# Patient Record
Sex: Female | Born: 1975
Health system: Southern US, Community
[De-identification: ages and names within clinical notes are randomized; demographics above are authoritative.]

## PROBLEM LIST (undated history)

## (undated) DIAGNOSIS — F329 Major depressive disorder, single episode, unspecified: Secondary | ICD-10-CM

## (undated) DIAGNOSIS — F419 Anxiety disorder, unspecified: Secondary | ICD-10-CM

## (undated) DIAGNOSIS — N200 Calculus of kidney: Secondary | ICD-10-CM

## (undated) DIAGNOSIS — F319 Bipolar disorder, unspecified: Secondary | ICD-10-CM

## (undated) DIAGNOSIS — F3181 Bipolar II disorder: Secondary | ICD-10-CM

## (undated) DIAGNOSIS — E119 Type 2 diabetes mellitus without complications: Secondary | ICD-10-CM

## (undated) DIAGNOSIS — G43909 Migraine, unspecified, not intractable, without status migrainosus: Secondary | ICD-10-CM

## (undated) DIAGNOSIS — F32A Depression, unspecified: Secondary | ICD-10-CM

## (undated) DIAGNOSIS — G4733 Obstructive sleep apnea (adult) (pediatric): Secondary | ICD-10-CM

## (undated) DIAGNOSIS — E079 Disorder of thyroid, unspecified: Secondary | ICD-10-CM

## (undated) HISTORY — DX: Obstructive sleep apnea (adult) (pediatric): G47.33

## (undated) HISTORY — DX: Migraine, unspecified, not intractable, without status migrainosus: G43.909

## (undated) HISTORY — DX: Type 2 diabetes mellitus without complications: E11.9

## (undated) HISTORY — DX: Calculus of kidney: N20.0

## (undated) HISTORY — DX: Bipolar II disorder: F31.81

## (undated) HISTORY — PX: OTHER SURGICAL HISTORY: SHX169

## (undated) HISTORY — DX: Major depressive disorder, single episode, unspecified: F32.9

## (undated) HISTORY — PX: WISDOM TOOTH EXTRACTION: SHX21

## (undated) HISTORY — PX: NASAL SEPTUM SURGERY: SHX37

## (undated) HISTORY — DX: Depression, unspecified: F32.A

## (undated) HISTORY — DX: Disorder of thyroid, unspecified: E07.9

## (undated) HISTORY — DX: Anxiety disorder, unspecified: F41.9

## (undated) HISTORY — DX: Bipolar disorder, unspecified: F31.9

---

## 2001-05-02 ENCOUNTER — Other Ambulatory Visit: Admission: RE | Admit: 2001-05-02 | Discharge: 2001-05-02 | Payer: Self-pay | Admitting: Obstetrics and Gynecology

## 2004-06-24 ENCOUNTER — Ambulatory Visit: Payer: Self-pay | Admitting: Family Medicine

## 2004-10-13 ENCOUNTER — Ambulatory Visit: Payer: Self-pay | Admitting: Internal Medicine

## 2004-11-09 ENCOUNTER — Ambulatory Visit: Payer: Self-pay | Admitting: Internal Medicine

## 2005-02-26 ENCOUNTER — Ambulatory Visit: Payer: Self-pay | Admitting: Internal Medicine

## 2006-04-18 ENCOUNTER — Other Ambulatory Visit: Admission: RE | Admit: 2006-04-18 | Discharge: 2006-04-18 | Payer: Self-pay | Admitting: Family Medicine

## 2006-04-18 ENCOUNTER — Ambulatory Visit: Payer: Self-pay | Admitting: Family Medicine

## 2006-04-18 ENCOUNTER — Encounter: Payer: Self-pay | Admitting: Family Medicine

## 2006-05-03 ENCOUNTER — Ambulatory Visit: Payer: Self-pay | Admitting: Family Medicine

## 2006-05-03 LAB — CONVERTED CEMR LAB
AST: 15 units/L (ref 0–37)
Albumin: 3.6 g/dL (ref 3.5–5.2)
Chloride: 101 meq/L (ref 96–112)
Cholesterol: 166 mg/dL (ref 0–200)
Glomerular Filtration Rate, Af Am: 108 mL/min/{1.73_m2}
HCT: 39.7 % (ref 36.0–46.0)
HDL: 31.7 mg/dL — ABNORMAL LOW (ref >39.0)
MCHC: 33.6 g/dL (ref 30.0–36.0)
MCV: 91.2 fL (ref 78.0–100.0)
Monocytes Absolute: 0.6 10*3/uL (ref 0.2–0.7)
Neutro Abs: 3.5 10*3/uL (ref 1.4–7.7)
Neutrophils Relative %: 54.7 % (ref 43.0–77.0)
Platelets: 266 10*3/uL (ref 150–400)
RDW: 12.2 % (ref 11.5–14.6)
Total Bilirubin: 0.6 mg/dL (ref 0.3–1.2)
Total Protein: 6.8 g/dL (ref 6.0–8.3)
Triglyceride fasting, serum: 190 mg/dL — ABNORMAL HIGH (ref 0–149)
VLDL: 38 mg/dL (ref 0–40)
WBC: 6.5 10*3/uL (ref 4.5–10.5)

## 2006-05-13 ENCOUNTER — Ambulatory Visit: Payer: Self-pay | Admitting: Family Medicine

## 2006-07-14 ENCOUNTER — Ambulatory Visit: Payer: Self-pay | Admitting: Family Medicine

## 2006-07-18 ENCOUNTER — Encounter: Payer: Self-pay | Admitting: Family Medicine

## 2006-07-18 LAB — CONVERTED CEMR LAB
Chlamydia, DNA Probe: NEGATIVE
GC Probe Amp, Genital: NEGATIVE

## 2006-07-21 ENCOUNTER — Encounter: Admission: RE | Admit: 2006-07-21 | Discharge: 2006-07-21 | Payer: Self-pay | Admitting: Family Medicine

## 2006-07-21 IMAGING — US US TRANSVAGINAL NON-OB
1 series · 14 of 25 positions shown · non-contrast
Comparison: none

CLINICAL DATA: Pelvic pain.  
TRANSABDOMINAL AND TRANSVAGINAL PELVIC ULTRASOUND:
TECHNIQUE: Both transabdominal and transvaginal ultrasound examinations of the pelvis were performed including evaluation of the uterus, ovaries, adnexal regions, and pelvic cul-de-sac.

[Series 1: unknown · 0.32mm/px · 14 of 47 slices shown]
[im 1/47]
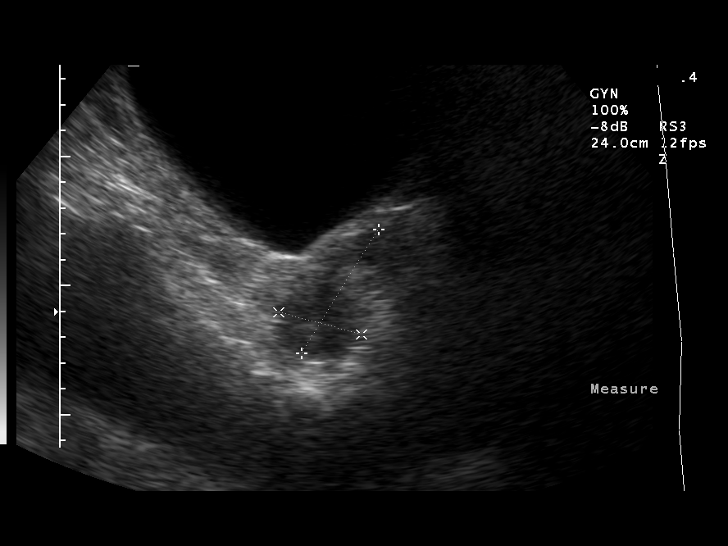
[im 4/47]
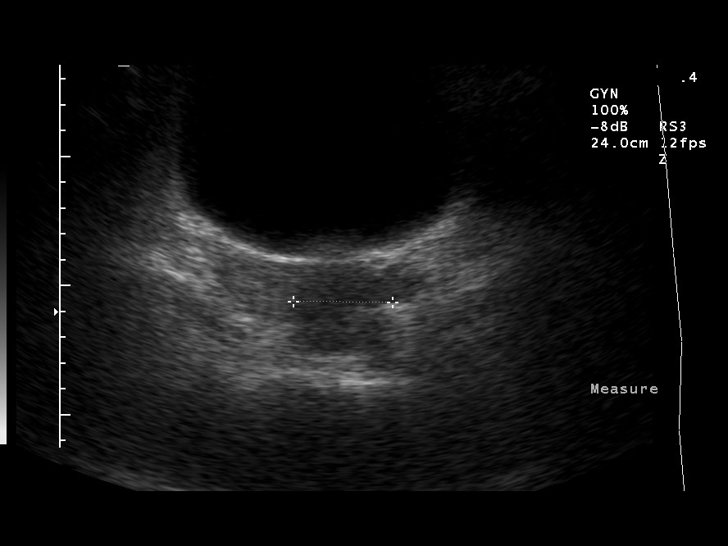
[im 8/47]
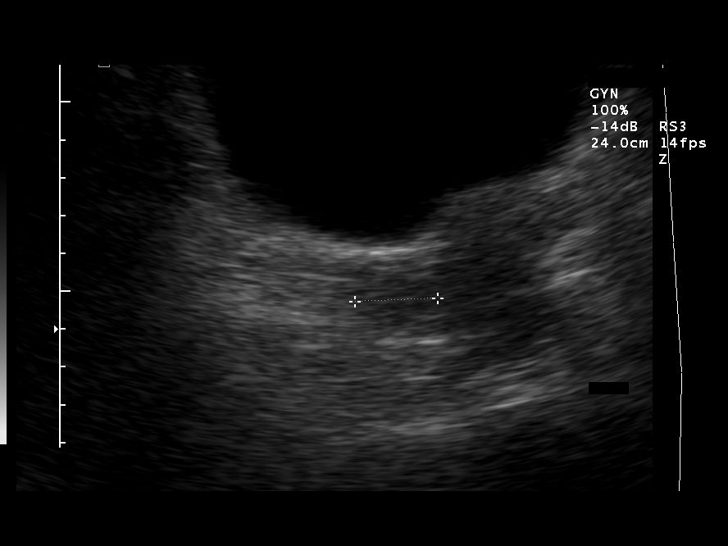
[im 12/47]
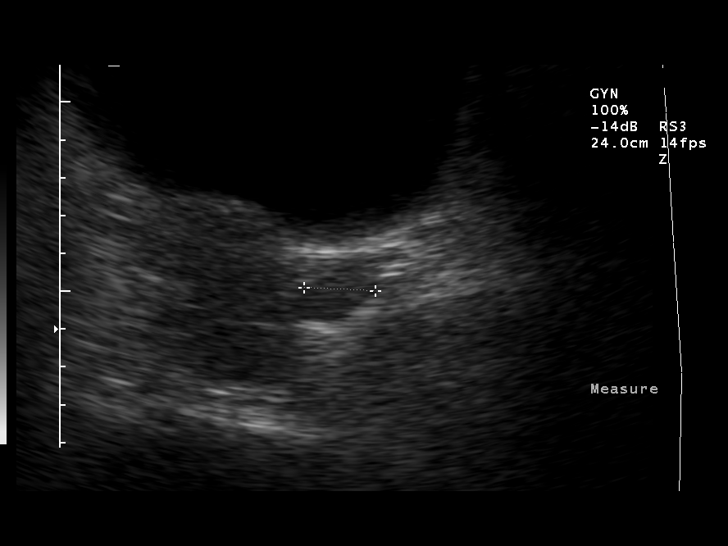
[im 16/47]
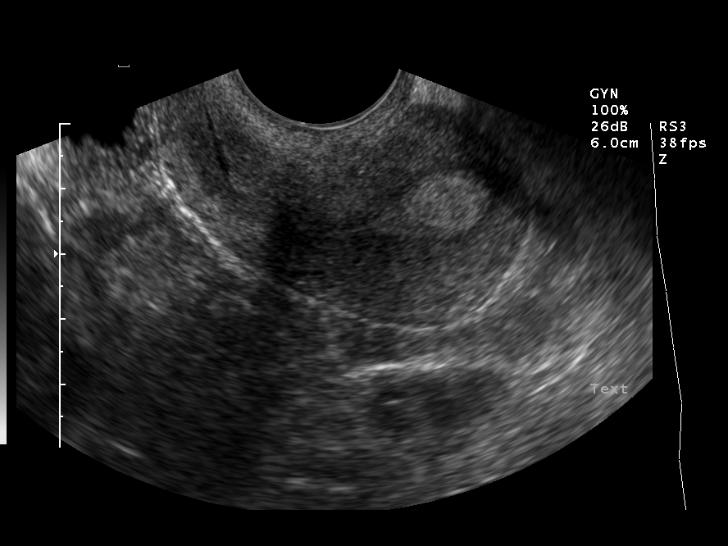
[im 18/47]
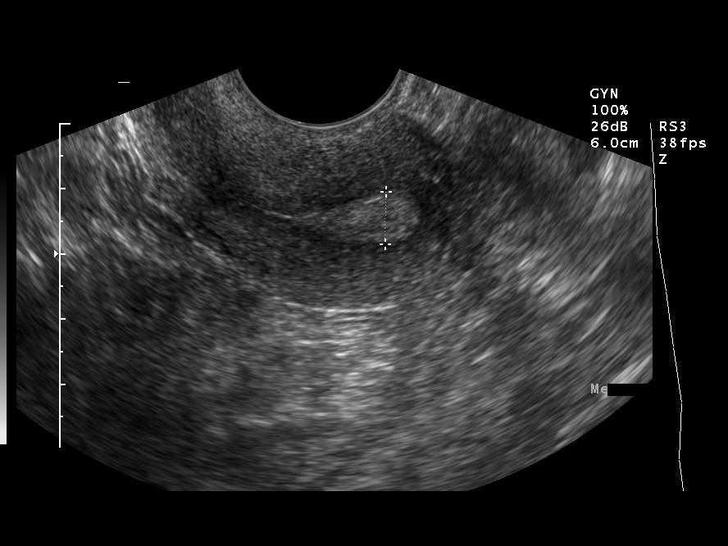
[im 22/47]
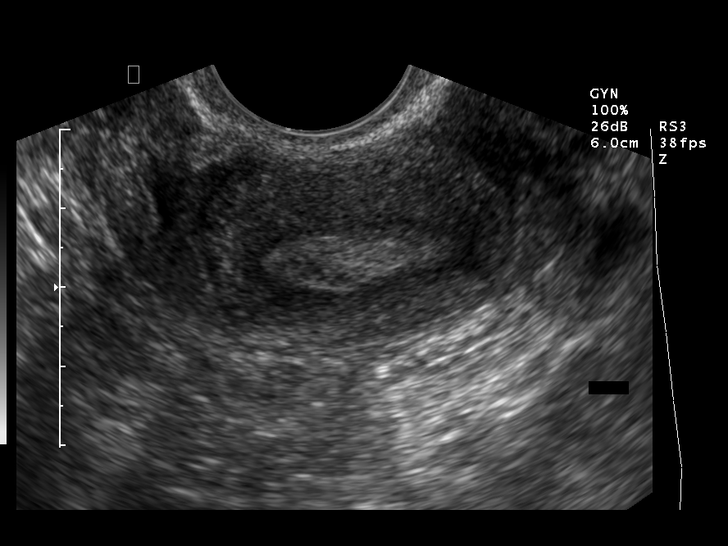
[im 25/47]
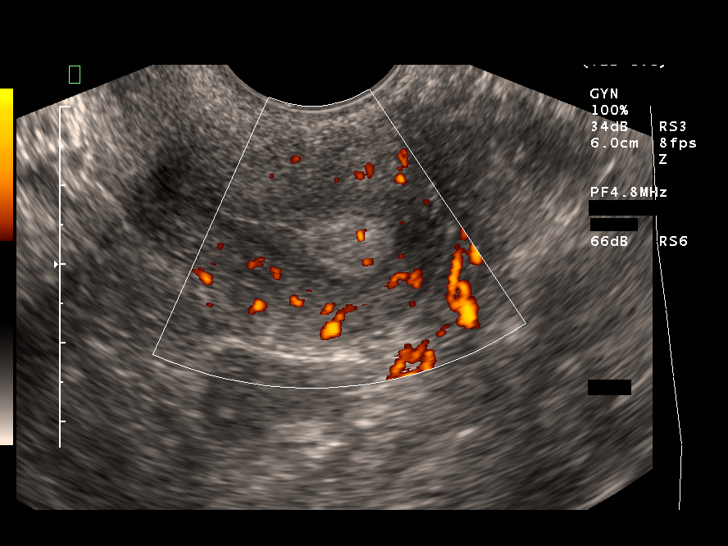
[im 29/47]
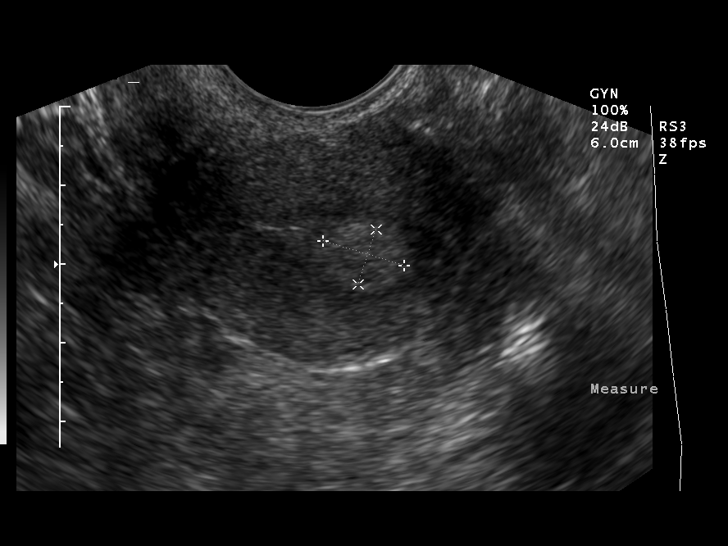
[im 31/47]
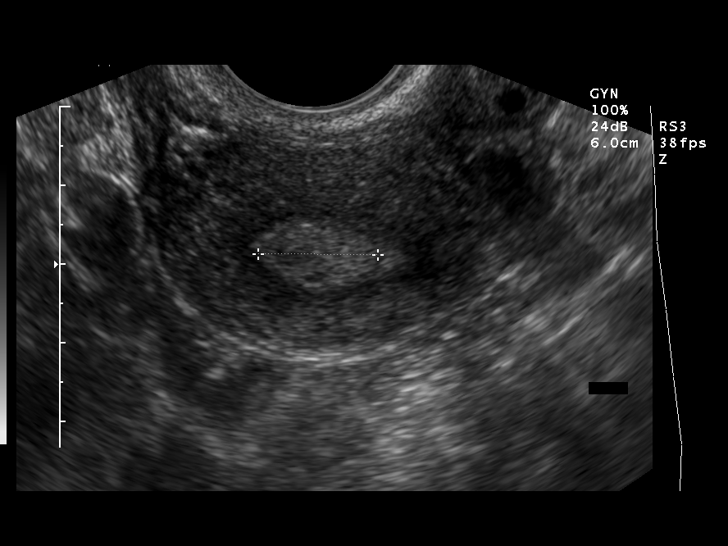
[im 35/47]
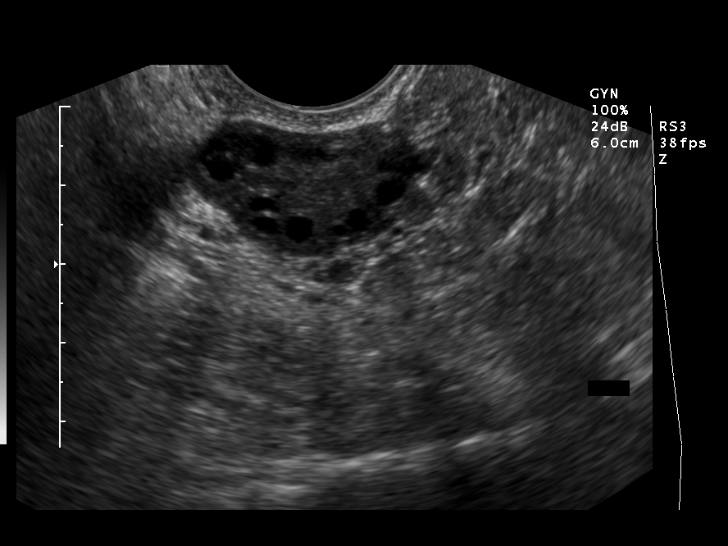
[im 39/47]
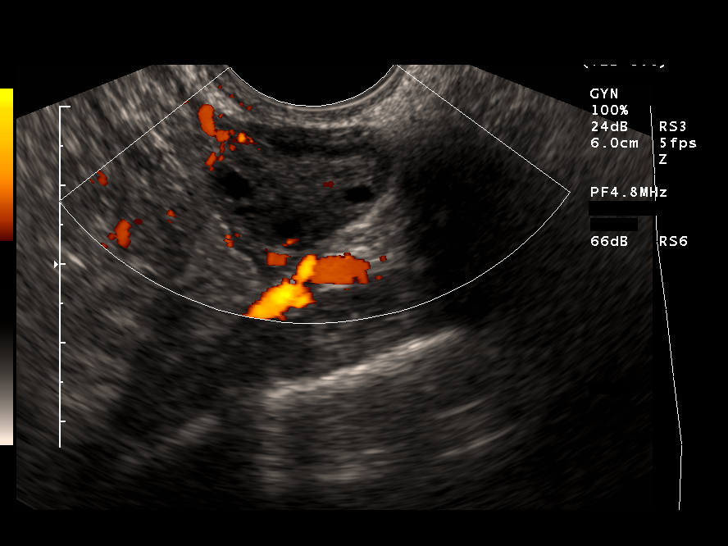
[im 43/47]
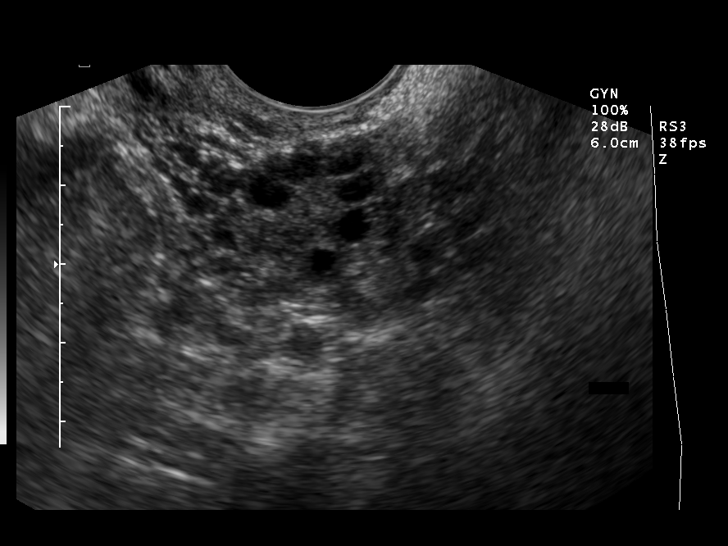
[im 47/47]
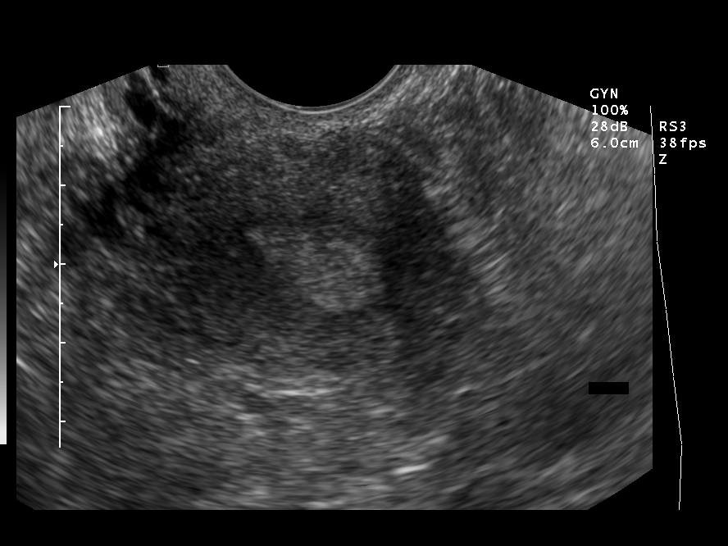

[14 of 25 positions shown; findings below may reference images not displayed]

FINDINGS: The uterus is retroverted measuring 5.6 cm sagittally with a depth of 3.3 cm and width of 3.8 cm.  There is a slightly more echogenic focus within the endometrium near the fundus.  This measures 11 x 7 x 15 mm and may represent an endometrial polyp.  Sonohysterogram is recommended.  Endometrium measures 11.5 mm in thickness at this level.  Numerous bilateral ovarian follicles are present.  No free fluid is seen.
IMPRESSION: 1.  There is an oval echogenic focus possibly within the endometrial cavity, that is slightly more echogenic than the surrounding endometrium and could represent an endometrial polyp of 11 x 7 x 15 mm.  Recommend sonohysterogram.
2.  Multiple bilateral ovarian follicles.

## 2006-11-18 ENCOUNTER — Encounter (HOSPITAL_COMMUNITY): Payer: Self-pay | Admitting: Obstetrics and Gynecology

## 2006-11-18 ENCOUNTER — Ambulatory Visit (HOSPITAL_COMMUNITY): Admission: RE | Admit: 2006-11-18 | Discharge: 2006-11-18 | Payer: Self-pay | Admitting: Obstetrics and Gynecology

## 2006-12-20 ENCOUNTER — Ambulatory Visit: Payer: Self-pay | Admitting: Family Medicine

## 2006-12-20 DIAGNOSIS — F3181 Bipolar II disorder: Secondary | ICD-10-CM | POA: Insufficient documentation

## 2006-12-20 DIAGNOSIS — L2089 Other atopic dermatitis: Secondary | ICD-10-CM

## 2006-12-21 ENCOUNTER — Telehealth: Payer: Self-pay | Admitting: Family Medicine

## 2007-02-01 ENCOUNTER — Ambulatory Visit: Payer: Self-pay | Admitting: Family Medicine

## 2007-02-01 DIAGNOSIS — L680 Hirsutism: Secondary | ICD-10-CM | POA: Insufficient documentation

## 2007-02-01 DIAGNOSIS — L708 Other acne: Secondary | ICD-10-CM

## 2007-02-02 LAB — CONVERTED CEMR LAB
Albumin: 3.8 g/dL (ref 3.5–5.2)
Alkaline Phosphatase: 73 units/L (ref 39–117)
BUN: 10 mg/dL (ref 6–23)
Bilirubin, Direct: 0.1 mg/dL (ref 0.0–0.3)
CO2: 28 meq/L (ref 19–32)
Creatinine, Ser: 0.8 mg/dL (ref 0.4–1.2)
Estradiol: 46.9 pg/mL
GFR calc Af Amer: 108 mL/min
Sodium: 139 meq/L (ref 135–145)
TSH: 24.89 microintl units/mL — ABNORMAL HIGH (ref 0.35–5.50)

## 2007-02-14 ENCOUNTER — Ambulatory Visit: Payer: Self-pay | Admitting: Family Medicine

## 2007-02-17 ENCOUNTER — Telehealth (INDEPENDENT_AMBULATORY_CARE_PROVIDER_SITE_OTHER): Payer: Self-pay | Admitting: *Deleted

## 2007-02-17 DIAGNOSIS — E039 Hypothyroidism, unspecified: Secondary | ICD-10-CM | POA: Insufficient documentation

## 2007-02-17 LAB — CONVERTED CEMR LAB
T3, Free: 2.5 pg/mL (ref 2.3–4.2)
TSH: 32.47 microintl units/mL — ABNORMAL HIGH (ref 0.35–5.50)

## 2007-02-27 ENCOUNTER — Telehealth: Payer: Self-pay | Admitting: Family Medicine

## 2007-04-19 ENCOUNTER — Ambulatory Visit: Payer: Self-pay | Admitting: Family Medicine

## 2007-04-20 LAB — CONVERTED CEMR LAB: TSH: 4.54 microintl units/mL (ref 0.35–5.50)

## 2007-05-05 ENCOUNTER — Telehealth (INDEPENDENT_AMBULATORY_CARE_PROVIDER_SITE_OTHER): Payer: Self-pay | Admitting: *Deleted

## 2007-06-23 ENCOUNTER — Ambulatory Visit: Payer: Self-pay | Admitting: Internal Medicine

## 2007-08-04 ENCOUNTER — Ambulatory Visit: Payer: Self-pay | Admitting: Family Medicine

## 2007-10-17 ENCOUNTER — Ambulatory Visit: Payer: Self-pay | Admitting: Internal Medicine

## 2007-10-17 DIAGNOSIS — J069 Acute upper respiratory infection, unspecified: Secondary | ICD-10-CM | POA: Insufficient documentation

## 2008-03-14 ENCOUNTER — Ambulatory Visit: Payer: Self-pay | Admitting: Internal Medicine

## 2008-04-09 ENCOUNTER — Ambulatory Visit: Payer: Self-pay | Admitting: Family Medicine

## 2008-04-09 DIAGNOSIS — M25569 Pain in unspecified knee: Secondary | ICD-10-CM

## 2008-04-09 DIAGNOSIS — M549 Dorsalgia, unspecified: Secondary | ICD-10-CM | POA: Insufficient documentation

## 2008-04-11 ENCOUNTER — Ambulatory Visit (HOSPITAL_BASED_OUTPATIENT_CLINIC_OR_DEPARTMENT_OTHER): Admission: RE | Admit: 2008-04-11 | Discharge: 2008-04-11 | Payer: Self-pay | Admitting: Family Medicine

## 2008-04-11 IMAGING — CR DG KNEE 1-2V*L*
2 series · 2 of 2 positions shown · non-contrast
Comparison: None available.

CLINICAL DATA: Knee pain.  Status post fall.

LEFT KNEE - 1-2 VIEW

[t knee ap left]
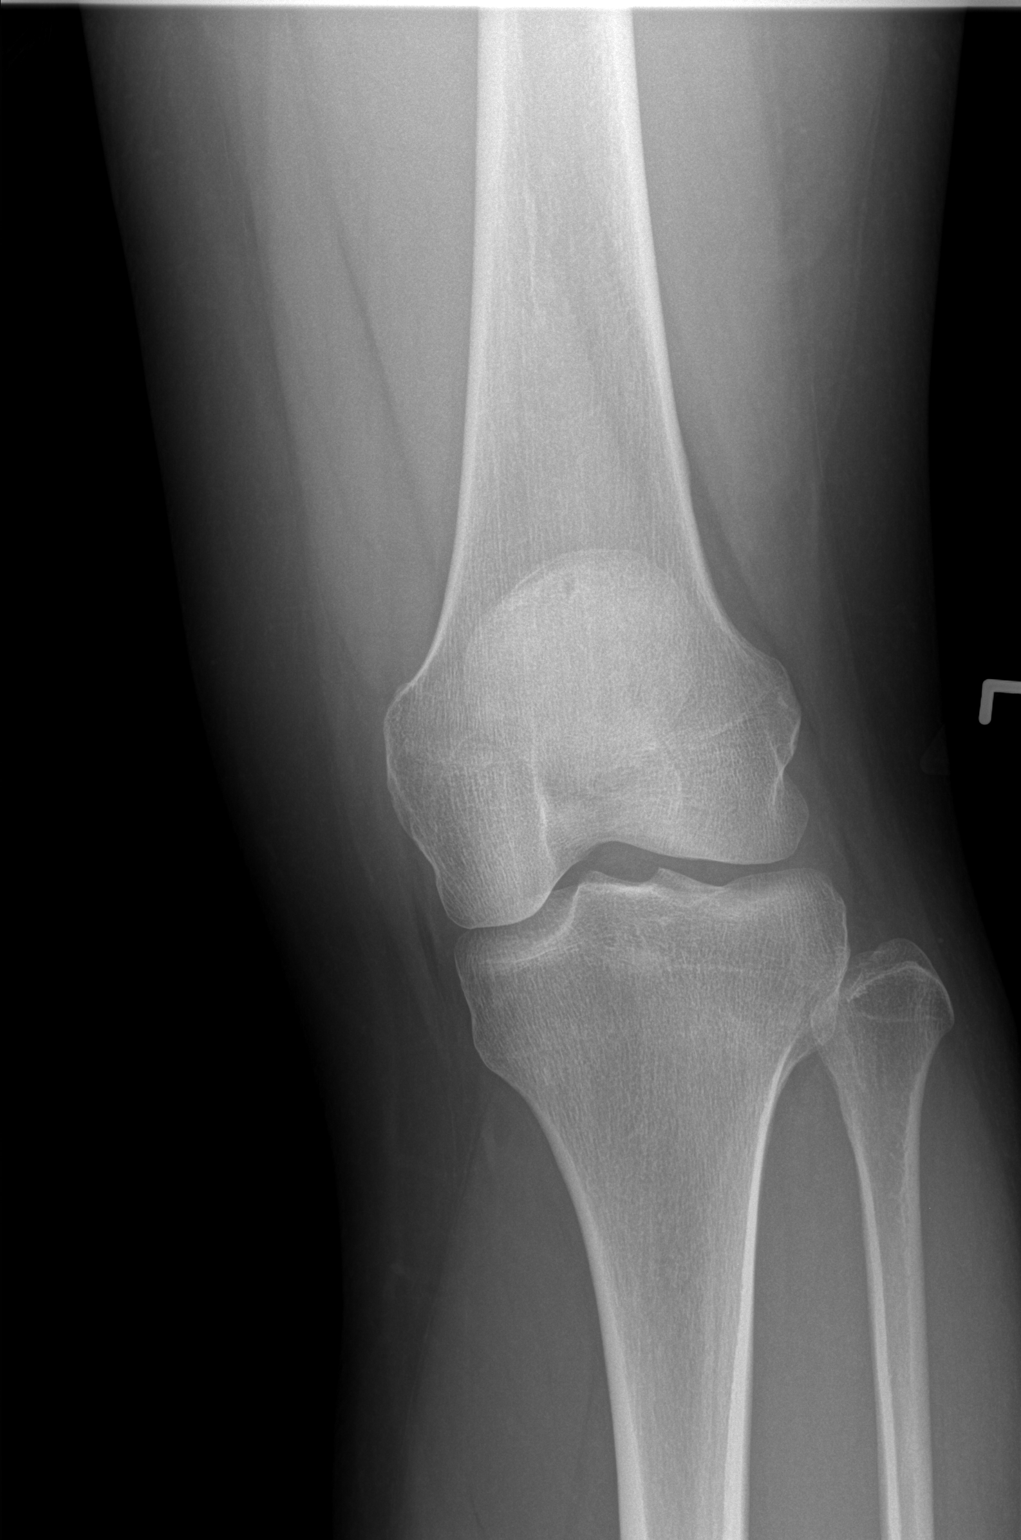

[t knee lat left]
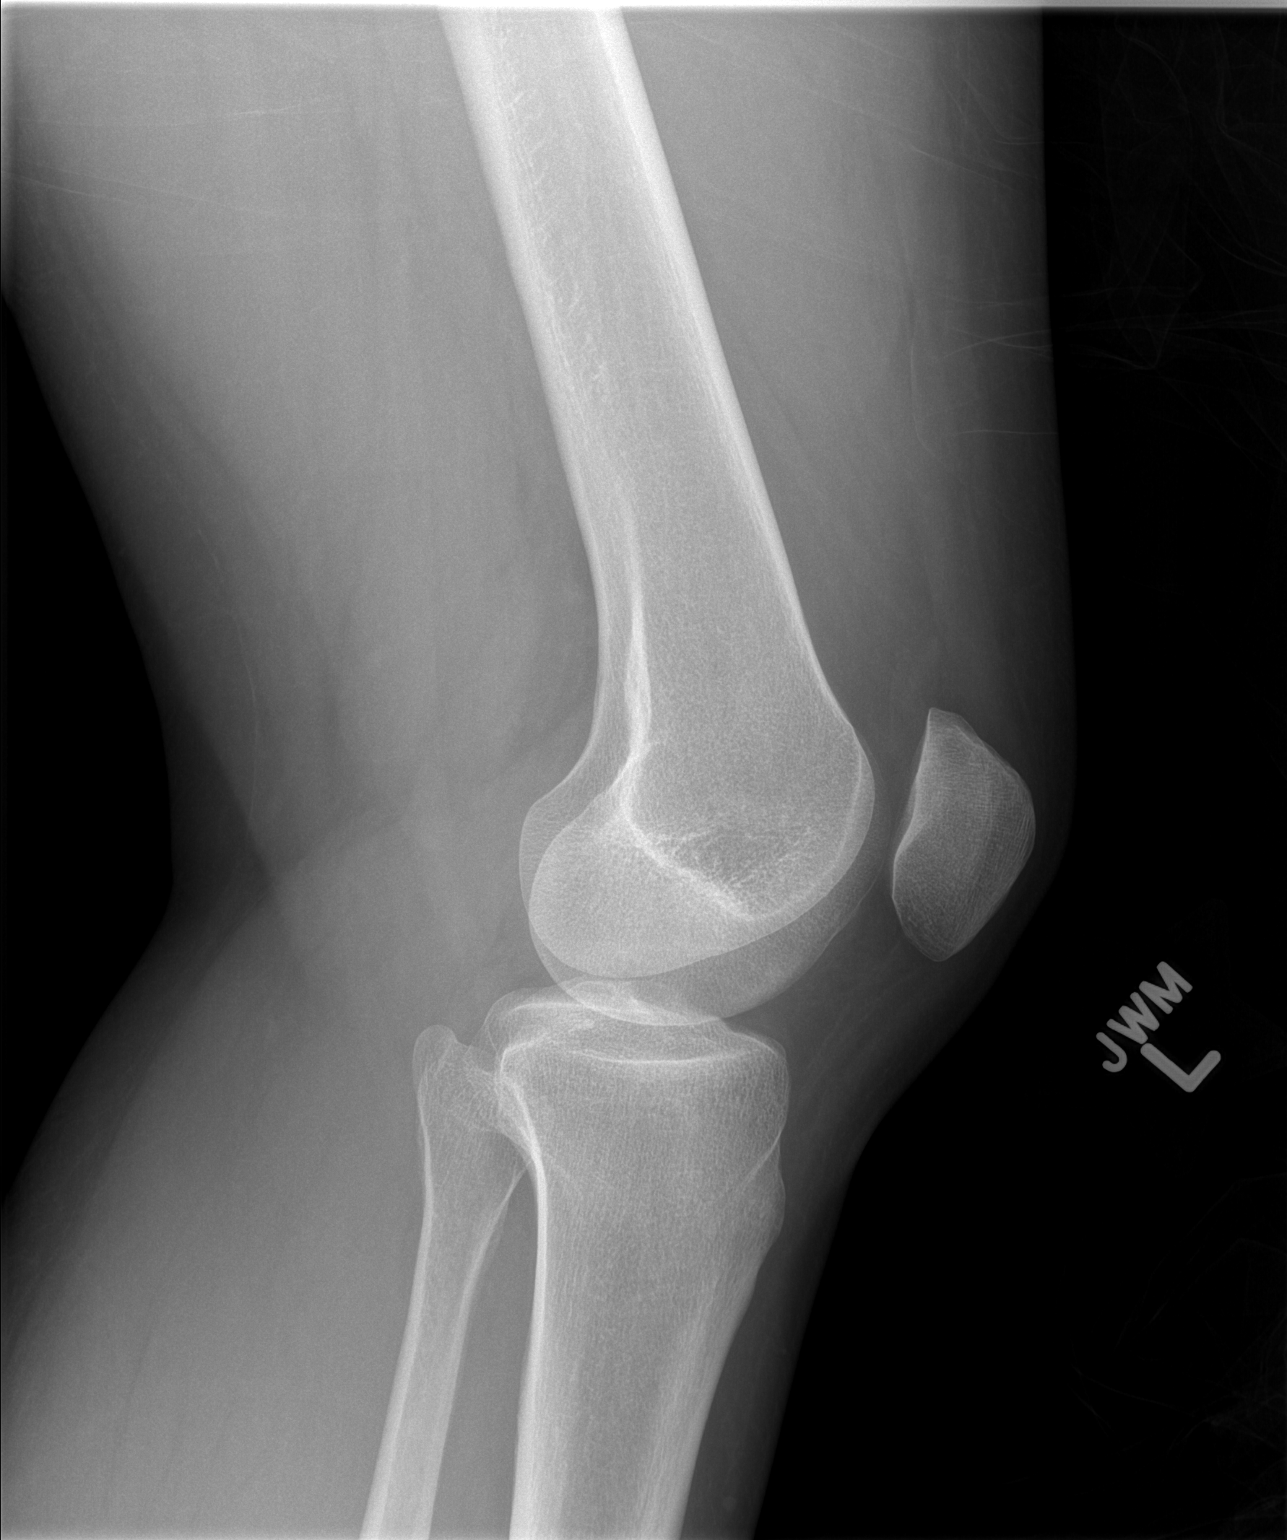

[2 of 2 positions shown; findings below may reference images not displayed]

FINDINGS: Imaged bones, joints and soft tissues appear normal.
IMPRESSION: Negative exam.

## 2008-04-11 IMAGING — CR DG LUMBAR SPINE 2-3V
2 series · 2 of 2 positions shown · non-contrast
Comparison: None available.

CLINICAL DATA: Fall, pain.

LUMBAR SPINE - 2-3 VIEW

[t l-spine a.p.]
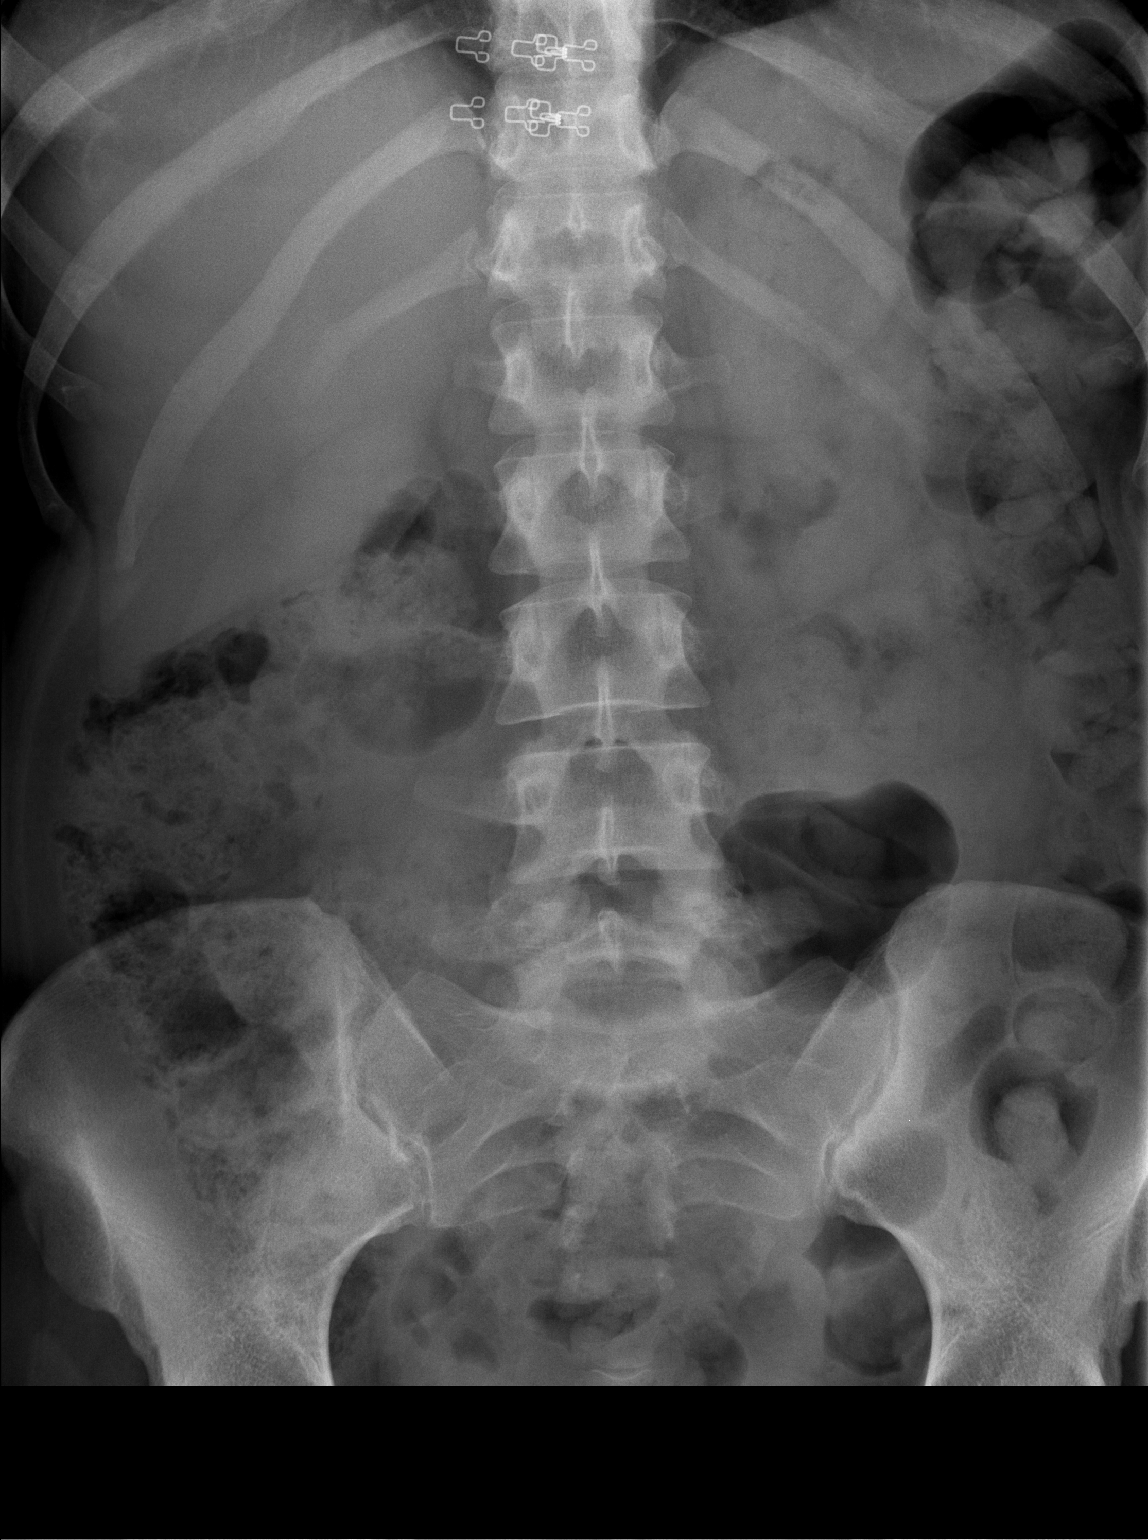

[t l-spine lat]
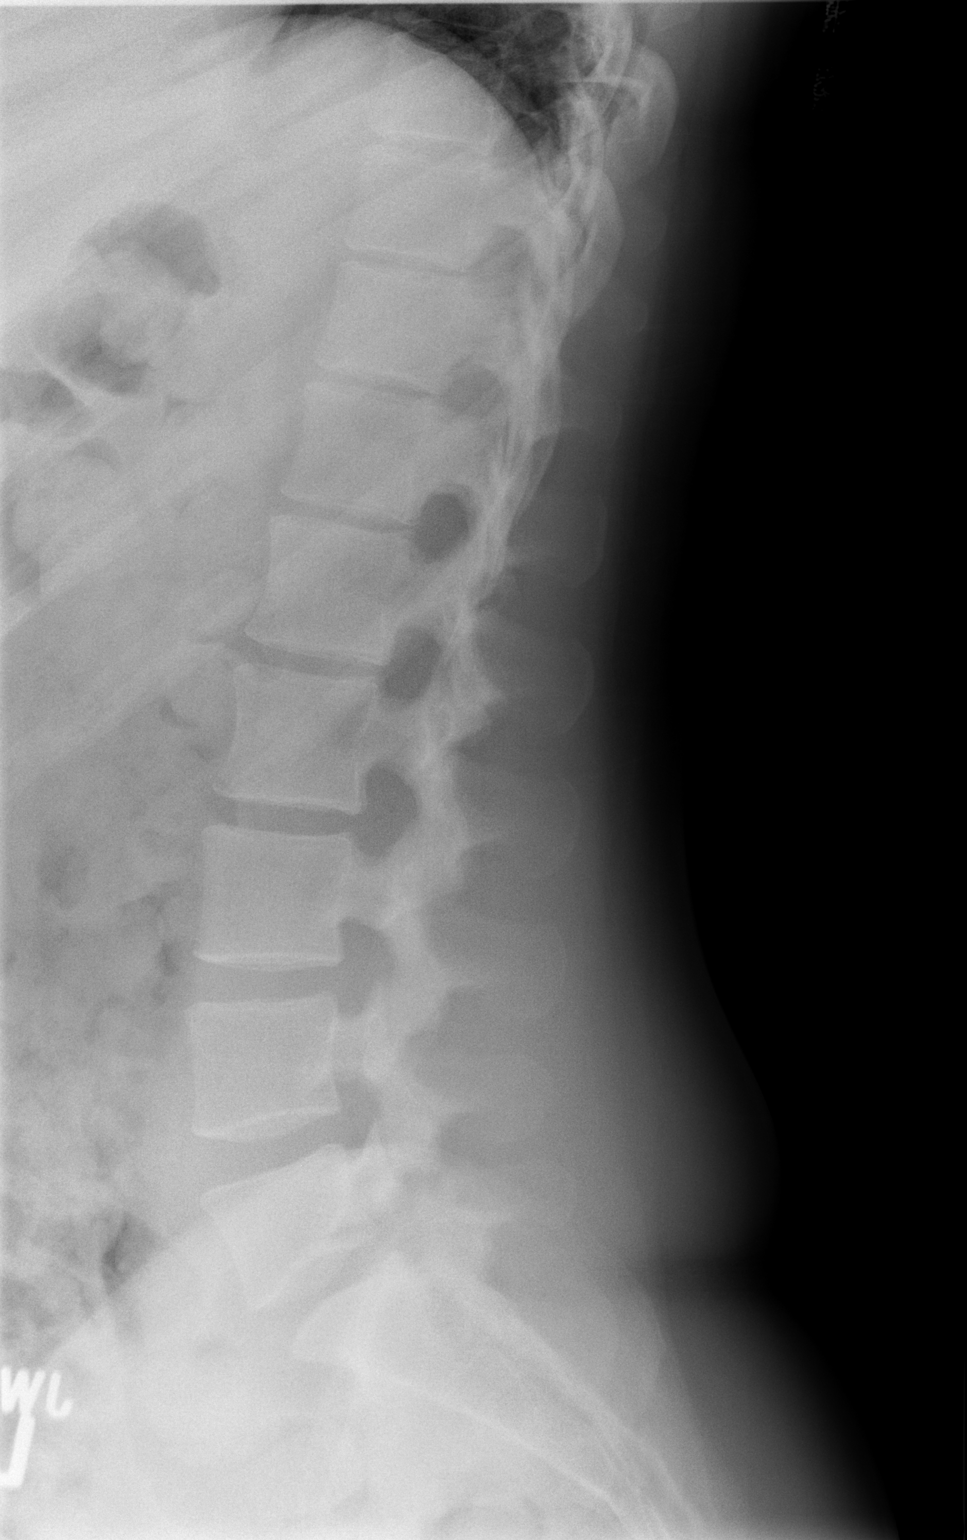

[2 of 2 positions shown; findings below may reference images not displayed]

FINDINGS: Vertebral body height is maintained.  Note is made that
back the patient has bilateral L5 pars interarticularis defects
with minimal anterolisthesis of approximately 0.3 cm of L5 on S1.
Soft tissue structures unremarkable.
IMPRESSION: 1.  No acute finding.
2.  Bilateral L5 pars interarticularis defects with associated mild
anterolisthesis.

## 2008-04-12 ENCOUNTER — Telehealth (INDEPENDENT_AMBULATORY_CARE_PROVIDER_SITE_OTHER): Payer: Self-pay | Admitting: *Deleted

## 2008-04-17 ENCOUNTER — Encounter: Payer: Self-pay | Admitting: Family Medicine

## 2008-04-23 ENCOUNTER — Telehealth (INDEPENDENT_AMBULATORY_CARE_PROVIDER_SITE_OTHER): Payer: Self-pay | Admitting: *Deleted

## 2008-04-29 ENCOUNTER — Encounter (INDEPENDENT_AMBULATORY_CARE_PROVIDER_SITE_OTHER): Payer: Self-pay | Admitting: *Deleted

## 2008-05-07 ENCOUNTER — Encounter: Payer: Self-pay | Admitting: Family Medicine

## 2008-09-10 ENCOUNTER — Ambulatory Visit: Payer: Self-pay | Admitting: Internal Medicine

## 2008-09-10 DIAGNOSIS — K625 Hemorrhage of anus and rectum: Secondary | ICD-10-CM | POA: Insufficient documentation

## 2008-09-12 ENCOUNTER — Encounter (INDEPENDENT_AMBULATORY_CARE_PROVIDER_SITE_OTHER): Payer: Self-pay | Admitting: *Deleted

## 2008-09-12 LAB — CONVERTED CEMR LAB
Basophils Absolute: 0.1 10*3/uL (ref 0.0–0.1)
Basophils Relative: 1.5 % (ref 0.0–3.0)
Chloride: 106 meq/L (ref 96–112)
Eosinophils Relative: 1.1 % (ref 0.0–5.0)
GFR calc non Af Amer: 102.57 mL/min (ref >60)
Glucose, Bld: 85 mg/dL (ref 70–99)
Hemoglobin: 12.8 g/dL (ref 12.0–15.0)
Lymphocytes Relative: 36.4 % (ref 12.0–46.0)
MCHC: 34.1 g/dL (ref 30.0–36.0)
MCV: 91.8 fL (ref 78.0–100.0)
Monocytes Relative: 8.4 % (ref 3.0–12.0)
Platelets: 232 10*3/uL (ref 150.0–400.0)
Potassium: 4.2 meq/L (ref 3.5–5.1)
RDW: 12.1 % (ref 11.5–14.6)
TSH: 12.93 microintl units/mL — ABNORMAL HIGH (ref 0.35–5.50)
Total Protein: 7 g/dL (ref 6.0–8.3)

## 2008-10-10 ENCOUNTER — Ambulatory Visit: Payer: Self-pay | Admitting: Internal Medicine

## 2009-01-07 ENCOUNTER — Telehealth (INDEPENDENT_AMBULATORY_CARE_PROVIDER_SITE_OTHER): Payer: Self-pay | Admitting: *Deleted

## 2009-05-13 ENCOUNTER — Encounter (INDEPENDENT_AMBULATORY_CARE_PROVIDER_SITE_OTHER): Payer: Self-pay | Admitting: *Deleted

## 2009-05-27 ENCOUNTER — Ambulatory Visit: Payer: Self-pay | Admitting: Family Medicine

## 2009-06-02 ENCOUNTER — Telehealth (INDEPENDENT_AMBULATORY_CARE_PROVIDER_SITE_OTHER): Payer: Self-pay | Admitting: *Deleted

## 2009-06-02 LAB — CONVERTED CEMR LAB: TSH: 8.37 microintl units/mL — ABNORMAL HIGH (ref 0.35–5.50)

## 2009-11-28 ENCOUNTER — Telehealth (INDEPENDENT_AMBULATORY_CARE_PROVIDER_SITE_OTHER): Payer: Self-pay | Admitting: *Deleted

## 2009-12-04 ENCOUNTER — Ambulatory Visit: Payer: Self-pay | Admitting: Family Medicine

## 2009-12-08 ENCOUNTER — Telehealth (INDEPENDENT_AMBULATORY_CARE_PROVIDER_SITE_OTHER): Payer: Self-pay | Admitting: *Deleted

## 2009-12-08 LAB — CONVERTED CEMR LAB: TSH: 21.53 microintl units/mL — ABNORMAL HIGH (ref 0.35–5.50)

## 2010-01-05 ENCOUNTER — Telehealth (INDEPENDENT_AMBULATORY_CARE_PROVIDER_SITE_OTHER): Payer: Self-pay | Admitting: *Deleted

## 2010-02-12 ENCOUNTER — Ambulatory Visit: Payer: Self-pay | Admitting: Family Medicine

## 2010-04-28 ENCOUNTER — Ambulatory Visit: Payer: Self-pay | Admitting: Family Medicine

## 2010-04-28 DIAGNOSIS — J029 Acute pharyngitis, unspecified: Secondary | ICD-10-CM | POA: Insufficient documentation

## 2010-06-04 ENCOUNTER — Telehealth: Payer: Self-pay | Admitting: Family Medicine

## 2010-07-12 ENCOUNTER — Encounter: Payer: Self-pay | Admitting: Family Medicine

## 2010-07-23 NOTE — Progress Notes (Signed)
Summary: lab appt   Phone Note Outgoing Call   Call placed by: Army Fossa CMA,  November 28, 2009 3:01 PM Reason for Call: Discuss lab or test results Summary of Call: Pt needs labwork:  TSH 2 months 244.9   Follow-up for Phone Call        lab scheduled [phone] .Marland KitchenOkey Regal Spring  November 28, 2009 4:36 PM  Follow-up by: Okey Regal Spring,  November 28, 2009 4:36 PM

## 2010-07-23 NOTE — Assessment & Plan Note (Signed)
Summary: SORE THROAT/RH......Marland Kitchen   Vital Signs:  Patient profile:   35 year old female Weight:      199.6 pounds O2 Sat:      96 % Temp:     98.0 degrees F oral Pulse rate:   81 / minute Pulse rhythm:   regular BP sitting:   120 / 80  (left arm) Cuff size:   large  Vitals Entered By: Almeta Monas CMA Duncan Dull) (April 28, 2010 3:41 PM) CC: C/o sore throat x1week--- sinus pressure, fever, cough and feeling achy x1day, URI symptoms   History of Present Illness:       This is a 35 year old woman who presents with URI symptoms.  The symptoms began 4 weeks ago.  Pt c/o ST since last visit but runny nose and sneezing today.  Pt is only taking her reg meds.  Associated symptoms include low-grade fever (<100.5 degrees).  The patient also reports itchy watery eyes, itchy throat, sneezing, and muscle aches.  The patient denies the following risk factors for Strep sinusitis: unilateral facial pain, unilateral nasal discharge, poor response to decongestant, double sickening, tooth pain, Strep exposure, tender adenopathy, and absence of cough.    Current Medications (verified): 1)  Zoloft 25 Mg Tabs (Sertraline Hcl) .... Take 1 Tab  Once Daily 2)  Synthroid 100 Mcg Tabs (Levothyroxine Sodium) .Marland Kitchen.. 1 By Mouth Daily. 3)  Vitamin B-12 100 Mcg Tabs (Cyanocobalamin) 4)  Omega 3 340 Mg Cpdr (Omega-3 Fatty Acids) .Marland Kitchen.. 1 By Mouth Once Daily  Allergies (verified): No Known Drug Allergies  Past History:  Past Medical History: Last updated: 09/10/2008 Hypothyroidism Anxiety Chronic Headaches Depression  Past Surgical History: Last updated: 09/10/2008 Uterine polyp Deviated septum D&C  Family History: Last updated: 09/10/2008  Family History of Breast Cancer:Aunts x2 Family History of Colon Cancer:Grandfather Family History of Ovarian Cancer:Mother Family History of Prostate Cancer:Grandfather Family History of Colitis/Crohn's:Aunt Family History of Diabetes: Father Family History of  Heart Disease: Father Family History of Irritable Bowel Syndrome:Mother  Social History: Last updated: 09/10/2008 Single no kids Occupation: Analyst Patient currently smokes.  Alcohol Use - no Daily Caffeine Use 1-3 Illicit Drug Use - no Patient gets regular exercise.  Risk Factors: Exercise: yes (09/10/2008)  Risk Factors: Smoking Status: current (09/10/2008) Packs/Day: < 1 (06/23/2007)  Family History: Reviewed history from 09/10/2008 and no changes required.  Family History of Breast Cancer:Aunts x2 Family History of Colon Cancer:Grandfather Family History of Ovarian Cancer:Mother Family History of Prostate Cancer:Grandfather Family History of Colitis/Crohn's:Aunt Family History of Diabetes: Father Family History of Heart Disease: Father Family History of Irritable Bowel Syndrome:Mother  Social History: Reviewed history from 09/10/2008 and no changes required. 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  Alcohol Use - no Daily Caffeine Use 1-3 Illicit Drug Use - no Patient gets regular exercise.  Review of Systems      See HPI  Physical Exam  General:  Well-developed,well-nourished,in no acute distress; alert,appropriate and cooperative throughout examination Ears:  External ear exam shows no significant lesions or deformities.  Otoscopic examination reveals clear canals, tympanic membranes are intact bilaterally without bulging, retraction, inflammation or discharge. Hearing is grossly normal bilaterally. Nose:  External nasal examination shows no deformity or inflammation. Nasal mucosa are pink and moist without lesions or exudates. Mouth:  pharyngeal erythema.   + enlarged tonsils with ?exudate no halitosis Neck:  No deformities, masses, or tenderness noted. Lungs:  Normal respiratory effort, chest expands symmetrically. Lungs are clear to auscultation,  no crackles or wheezes. Heart:  Normal rate and regular rhythm. S1 and S2 normal  without gallop, murmur, click, rub or other extra sounds. Skin:  Intact without suspicious lesions or rashes Psych:  Cognition and judgment appear intact. Alert and cooperative with normal attention span and concentration. No apparent delusions, illusions, hallucinations   Impression & Recommendations:  Problem # 1:  URI (ICD-465.9) take otc antihistamine, The following medications were removed from the medication list:    Cheratussin Ac 100-10 Mg/9ml Syrp (Guaifenesin-codeine) .Marland Kitchen... 1-2 tsp by mouth at bedtime as needed  Instructed on symptomatic treatment. Call if symptoms persist or worsen.   Problem # 2:  ACUTE PHARYNGITIS (ICD-462) antihistamine , gargle with salt water ,  tylenol or advil for pain as needed  Instructed to complete antibiotics and call if not improved in 48 hours.   Orders: T-Culture, Throat ([id number])  Complete Medication List: 1)  Zoloft 25 Mg Tabs (Sertraline hcl) .... Take 1 tab  once daily 2)  Synthroid 100 Mcg Tabs (Levothyroxine sodium) .Marland Kitchen.. 1 by mouth daily. 3)  Vitamin B-12 100 Mcg Tabs (Cyanocobalamin) 4)  Omega 3 340 Mg Cpdr (Omega-3 fatty acids) .Marland Kitchen.. 1 by mouth once daily   Orders Added: 1)  T-Culture, Throat [[id number]] 2)  Est. Patient Level III [[address]65]

## 2010-07-23 NOTE — Progress Notes (Signed)
Summary: -Triage: RX Concerns  Phone Note Call from Patient Call back at Work Phone [phone] Call back at EXT 262   Caller: Patient Summary of Call: Message left on triage VM: Patient with RX concerns, please call   Shonna Chock CMA  January 05, 2010 11:16 AM   Follow-up for Phone Call        pt would like to be weaned on zoloft and is requested a lower dose to do so. pls advise..............Marland KitchenFelecia Deloach CMA  January 05, 2010 2:10 PM   Additional Follow-up for Phone Call Additional follow up Details #1::        break tab in 1/2 take 50 mg for 1 week then we can call in 25 mg  ---for one week then stop. Additional Follow-up by: Loreen Freud DO,  January 05, 2010 3:47 PM    Additional Follow-up for Phone Call Additional follow up Details #2::    left message to call office..........Marland KitchenFelecia Deloach CMA  January 05, 2010 4:38 PM  left message to call  office.Marland KitchenMarland KitchenFelecia Deloach CMA  January 06, 2010 8:54 AM  DISCUSS WITH PATIENT...................Marland KitchenFelecia Deloach CMA  January 06, 2010 2:43 PM   New/Updated Medications: ZOLOFT 25 MG TABS (SERTRALINE HCL) Take 1 tab  once daily Prescriptions: ZOLOFT 25 MG TABS (SERTRALINE HCL) Take 1 tab  once daily  #30 x 0   Entered by:   Jeremy Johann CMA   Authorized by:   Loreen Freud DO   Signed by:   Jeremy Johann CMA on 01/06/2010   Method used:   Printed then faxed to ...       [facility] [phone][facility] (retail)       [address]       [place], [place]  [address]       Ph: [phone]       Fax: [phone]   RxID:   [id number]

## 2010-07-23 NOTE — Assessment & Plan Note (Signed)
Summary: COLD, COUGH, SLIGHT FEVER////SPH   Vital Signs:  Patient profile:   35 year old female Weight:      195.8 pounds BMI:     32.20 Temp:     98.3 degrees F oral Pulse rate:   91 / minute Pulse rhythm:   regular BP sitting:   130 / 90  (left arm)  Vitals Entered By: Almeta Monas CMA Duncan Dull) (February 12, 2010 11:49 AM) CC: c/o cough x1wk, Cough   History of Present Illness:  Cough      This is a 35 year old woman who presents with Cough.  The symptoms began 1 week ago.  The patient reports productive cough, wheezing, and fever, but denies non-productive cough, pleuritic chest pain, shortness of breath, exertional dyspnea, hemoptysis, and malaise.  Associated symtpoms include cold/URI symptoms and nasal congestion.  The patient denies the following symptoms: sore throat, chronic rhinitis, weight loss, acid reflux symptoms, and peripheral edema.  The cough is worse with lying down.  Ineffective prior treatments have included OTC cough medication.    Current Medications (verified): 1)  Zoloft 25 Mg Tabs (Sertraline Hcl) .... Take 1 Tab  Once Daily 2)  Synthroid 100 Mcg Tabs (Levothyroxine Sodium) .Marland Kitchen.. 1 By Mouth Daily. 3)  Augmentin 875-125 Mg Tabs (Amoxicillin-Pot Clavulanate) .Marland Kitchen.. 1 By Mouth Two Times A Day 4)  Cheratussin Ac 100-10 Mg/39ml Syrp (Guaifenesin-Codeine) .Marland Kitchen.. 1-2 Tsp By Mouth At Bedtime As Needed  Allergies (verified): No Known Drug Allergies  Past History:  Past medical, surgical, family and social histories (including risk factors) reviewed for relevance to current acute and chronic problems.  Past Medical History: Reviewed history from 09/10/2008 and no changes required. Hypothyroidism Anxiety Chronic Headaches Depression  Past Surgical History: Reviewed history from 09/10/2008 and no changes required. Uterine polyp Deviated septum D&C  Family History: Reviewed history from 09/10/2008 and no changes required.  Family History of Breast Cancer:Aunts  x2 Family History of Colon Cancer:Grandfather Family History of Ovarian Cancer:Mother Family History of Prostate Cancer:Grandfather Family History of Colitis/Crohn's:Aunt Family History of Diabetes: Father Family History of Heart Disease: Father Family History of Irritable Bowel Syndrome:Mother  Social History: Reviewed history from 09/10/2008 and no changes required. 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  Alcohol Use - no Daily Caffeine Use 1-3 Illicit Drug Use - no Patient gets regular exercise.  Review of Systems      See HPI  Physical Exam  General:  Well-developed,well-nourished,in no acute distress; alert,appropriate and cooperative throughout examination Ears:  External ear exam shows no significant lesions or deformities.  Otoscopic examination reveals clear canals, tympanic membranes are intact bilaterally without bulging, retraction, inflammation or discharge. Hearing is grossly normal bilaterally. Nose:  L frontal sinus tenderness, L maxillary sinus tenderness, R frontal sinus tenderness, and R maxillary sinus tenderness.   Mouth:  Oral mucosa and oropharynx without lesions or exudates.  Teeth in good repair. Neck:  No deformities, masses, or tenderness noted. Lungs:  R wheezes and L wheezes.   Heart:  normal rate and no murmur.   Psych:  Cognition and judgment appear intact. Alert and cooperative with normal attention span and concentration. No apparent delusions, illusions, hallucinations   Impression & Recommendations:  Problem # 1:  BRONCHITIS- ACUTE (ICD-466.0)  Take antibiotics and other medications as directed. Encouraged to push clear liquids, get enough rest, and take acetaminophen as needed. To be seen in 5-7 days if no improvement, sooner if worse.  Her updated medication list for this problem  includes:    Augmentin 875-125 Mg Tabs (Amoxicillin-pot clavulanate) .Marland Kitchen... 1 by mouth two times a day    Cheratussin Ac 100-10 Mg/31ml Syrp  (Guaifenesin-codeine) .Marland Kitchen... 1-2 tsp by mouth at bedtime as needed  Complete Medication List: 1)  Zoloft 25 Mg Tabs (Sertraline hcl) .... Take 1 tab  once daily 2)  Synthroid 100 Mcg Tabs (Levothyroxine sodium) .Marland Kitchen.. 1 by mouth daily. 3)  Augmentin 875-125 Mg Tabs (Amoxicillin-pot clavulanate) .Marland Kitchen.. 1 by mouth two times a day 4)  Cheratussin Ac 100-10 Mg/51ml Syrp (Guaifenesin-codeine) .Marland Kitchen.. 1-2 tsp by mouth at bedtime as needed Prescriptions: CHERATUSSIN AC 100-10 MG/5ML SYRP (GUAIFENESIN-CODEINE) 1-2 tsp by mouth at bedtime as needed  #6 oz x 0   Entered and Authorized by:   Loreen Freud DO   Signed by:   Loreen Freud DO on 02/12/2010   Method used:   Print then Give to Patient   RxID:   [id number] AUGMENTIN 875-125 MG TABS (AMOXICILLIN-POT CLAVULANATE) 1 by mouth two times a day  #20 x 0   Entered and Authorized by:   Loreen Freud DO   Signed by:   Loreen Freud DO on 02/12/2010   Method used:   Electronically to        [facility] [phone]* (retail)       [address]       [place], [place]  [address]       Ph: [phone]       Fax: [phone]   RxID:   [id number]

## 2010-07-23 NOTE — Progress Notes (Signed)
Summary: Lab Results   Phone Note Outgoing Call   Call placed by: Army Fossa CMA,  December 08, 2009 11:42 AM Reason for Call: Discuss lab or test results Summary of Call: Regarding lab results, LMTCB:  hypothyroid---increase synthroid to 100mg  1 by mouth once daily #30  2 refills---recheck 2 months  244.9  TSH Signed by Loreen Freud DO on 12/05/2009 at 10:17 PM   Follow-up for Phone Call        Pt is aware. Army Fossa CMA  December 08, 2009 12:31 PM     New/Updated Medications: SYNTHROID 100 MCG TABS (LEVOTHYROXINE SODIUM) 1 by mouth daily. Prescriptions: SYNTHROID 100 MCG TABS (LEVOTHYROXINE SODIUM) 1 by mouth daily.  #30 x 2   Entered by:   Army Fossa CMA   Authorized by:   Loreen Freud DO   Signed by:   Army Fossa CMA on 12/08/2009   Method used:   Electronically to        [facility] [phone]* (retail)       [address]       [place], [place]  [address]       Ph: [phone]       Fax: [phone]   RxID:   [id number]

## 2010-07-23 NOTE — Progress Notes (Signed)
Summary: Refill  Phone Note Refill Request Call back at Work Phone [phone]   Refills Requested: Medication #1:  ZOLOFT 25 MG TABS Take 1 tab  once daily [facility] [address]...Marland KitchenMarland KitchenFelecia Deloach CMA  June 04, 2010 9:27 AM    Follow-up for Phone Call        last seen 04/28/10 and filled 12/27/09. PLease advise Follow-up by: Almeta Monas CMA Duncan Dull),  June 04, 2010 9:42 AM  Additional Follow-up for Phone Call Additional follow up Details #1::        refill sent to pharmacy for 1 year Additional Follow-up by: Loreen Freud DO,  June 04, 2010 10:22 AM    Prescriptions: ZOLOFT 25 MG TABS (SERTRALINE HCL) Take 1 tab  once daily  #30 x 11   Entered and Authorized by:   Loreen Freud DO   Signed by:   Loreen Freud DO on 06/04/2010   Method used:   Electronically to        [facility] [phone]* (retail)       [address]       [place], [place]  [address]       Ph: [phone]       Fax: [phone]   RxID:   [id number]

## 2010-08-21 ENCOUNTER — Encounter (INDEPENDENT_AMBULATORY_CARE_PROVIDER_SITE_OTHER): Payer: Self-pay | Admitting: *Deleted

## 2010-08-27 NOTE — Letter (Signed)
Summary: Primary Care Appointment Letter  [facility] [place]  [address] [place], [place] [address]   Phone: [phone]  Fax: [phone]    08/21/2010 MRN: [id number]  [facility] STAN[place] [address] [place], [place]  [address]  Dear Ms. STANFILL,   Your Primary Care Physician Loreen Freud DO has indicated that:    _______it is time to schedule an appointment.    _______you missed your appointment on______ and need to call and          reschedule.    ___x____you need to have lab work done before further refills will be given to recheck TSH per labs from 11/2009 this was supposed to be checked in 2 months.    _______you need to schedule an appointment discuss lab or test results.    _______you need to call to reschedule your appointment that is                       scheduled on _________.     Please call our office as soon as possible. Our phone number is [phone]_____. Our office is open 8a-5p, Monday through Friday.     Thank you,    [place] [facility] Primary [facility] Scheduler

## 2010-09-02 ENCOUNTER — Other Ambulatory Visit (INDEPENDENT_AMBULATORY_CARE_PROVIDER_SITE_OTHER): Payer: BC Managed Care – PPO

## 2010-09-02 ENCOUNTER — Encounter (INDEPENDENT_AMBULATORY_CARE_PROVIDER_SITE_OTHER): Payer: Self-pay | Admitting: *Deleted

## 2010-09-02 ENCOUNTER — Other Ambulatory Visit: Payer: Self-pay | Admitting: Family Medicine

## 2010-09-02 DIAGNOSIS — E039 Hypothyroidism, unspecified: Secondary | ICD-10-CM

## 2010-09-03 LAB — TSH: TSH: 11.28 u[IU]/mL — ABNORMAL HIGH (ref 0.35–5.50)

## 2010-09-07 ENCOUNTER — Telehealth: Payer: Self-pay | Admitting: Family Medicine

## 2010-09-17 NOTE — Progress Notes (Signed)
Summary: -Call-a-nurse  Phone Note Outgoing Call   Details for Reason: Triage Call Report Triage Record Num: [id number] Operator: Amy Head Patient Name: Cheryl [name] Call Date & Time: 09/06/2010 8:53:46AM Patient Phone: [phone] PCP: Lelon Perla Patient Gender: Female PCP Fax : [phone] Patient DOB: 1976-05-12 Practice Name: [facility] Reason for Call: Pt/Kalisa calling for N/V, onset this morning at 0600. Unsure of fever. Vomited x 4. Vomiting some yellow bile. All emergent sxs per Vomiting protocol r/o. Home care advice given. Protocol(s) Used: Nausea or Vomiting Recommended Outcome per Protocol: Provide Home/Self Care Reason for Outcome: New onset of 3-4 episodes vomiting or diarrhea following mild abdominal cramping Care Advice: Antidiarrheal medications are usually unnecessary. Use only after consulting your provider. Application of A&D ointment or witch hazel medicated pads may help relieve anal irritation.  ~  ~ Call provider if symptoms do not improve after 24 hours of home care. Call provider immediately if develop severe pain, black, tarry stools, bloody stools, blood-streaked or coffee ground-looking vomitus, or abdomen swollen.  ~  ~ SYMPTOM / CONDITION MANAGEMENT  ~ CAUTIONS Go to the ED if you have developed signs and symptoms of dehydration such as very dry mouth and tongue; increased pulse rate at rest; no urine output for 8 hours or more; increasing weakness or drowsiness, or lightheadedness when trying to sit upright or standing.  ~ Vomiting and Diarrhea Care: - Do not eat solid foods until vomiting subsides. - Begin taking fluids by sucking on ice chips or popsicles or taking sips of cool, clear fluids (soda, fruit juices that are low acid, sports drinks or nonprescription oral rehydration solution). - Gradually drink larger amounts of these fluids so that you are drinking six to eight 8 oz. (1.2 to 1.6 liters) of fluids  a day. - Keep activity to a minimum. - Once vomiting and diarrhea subside, eat smaller, more frequent meals of easily digested foods Summary of Call: Left message to call office ........Marland KitchenFelecia Deloach CMA  September 07, 2010 8:29 AM   Pt states that all symptoms have resolve no more diarrhea or vomiting. Pt is doing good today...........Marland KitchenFelecia Deloach CMA  September 07, 2010 8:46 AM

## 2010-11-06 NOTE — Op Note (Signed)
NAMEENSLEY, BLAS          ACCOUNT NO.:  [id number]   MEDICAL RECORD NO.:  [id number]          PATIENT TYPE:  AMB   LOCATION:  SDC                           FACILITY:  [facility]   PHYSICIAN:  Zelphia Cairo, MD    DATE OF BIRTH:  Dec 23, 1975   DATE OF PROCEDURE:  11/18/2006  DATE OF DISCHARGE:                               OPERATIVE REPORT   PREOPERATIVE DIAGNOSIS:  Suspect endometrial polyp.   POSTOPERATIVE DIAGNOSIS:  Suspect endometrial polyp.   PROCEDURE:  Hysteroscopy, dilatation and curettage, polypectomy.   ESTIMATED BLOOD LOSS:  Minimal.   FLUID DEFICIT:  55 mL.   COMPLICATIONS:  None.   CONDITION:  Stable to recovery room.   PROCEDURE:  The patient was taken to the operating room where anesthesia  was found to be adequate.  She was placed in the dorsal lithotomy  position using Allen stirrups.  She was prepped and draped in a sterile  fashion and a catheter was used to drain her bladder for approximately  50 mL of clear urine.  A vaginal speculum was then placed in the vagina  and a single-tooth tenaculum was placed on the anterior lip of the  cervix.  The cervix was dilated using Pratt dilators.  The uterus  sounded to 7 cm.  The diagnostic hysteroscope was then inserted into the  uterine cavity.  Endometrial polyp was noted to be present in the right  posterior uterine wall.  The right ostia could not be visualized.  The  left ostia was visualized and appeared normal, otherwise the endometrial  cavity was normal in appearance.  The hysteroscope was then removed.  Polyp forceps were inserted into the uterine cavity, used to grasp the  endometrial polyp and remove it from the endometrial canal.  A gentle  curetting was then performed with a Kevorkian curette.  Specimen was  placed on Telfa and passed off to pathology.  The diagnostic  hysteroscope was then reinserted into the endometrial cavity.  Polyp was  noted to be removed.  The endometrial cavity again looked  normal.  The  hysteroscope was removed.  1% Lidocaine was used to provide local  anesthesia.  Tenaculum and speculum were removed from the vagina.  The  patient was taken to the recovery room in stable condition.      Zelphia Cairo, MD  Electronically Signed     GA/MEDQ  D:  11/18/2006  T:  11/18/2006  Job:  [id number]

## 2010-11-06 NOTE — H&P (Signed)
Cheryl [name], Cheryl [name]          ACCOUNT NO.:  [id number]   MEDICAL RECORD NO.:  [id number]          PATIENT TYPE:  AMB   LOCATION:  [facility]                           FACILITY:  [facility]   PHYSICIAN:  Zelphia Cairo, MD    DATE OF BIRTH:  [birth year]   DATE OF ADMISSION:  DATE OF DISCHARGE:                              HISTORY & PHYSICAL   A 35 year old white female who presented in February in referral from  Dr. Laury Axon for possible endometrial polyp.  She had a pelvic ultrasound  done at [facility] for pelvic pain which showed an echogenic  focus within the endometrial cavity.  Upon further evaluation,  sonohysterogram was performed showing a 13-mm polypoid mass within the  endometrial cavity, after a saline infusion.  She does report some  intermenstrual bleeding occasionally.  She also notes some occasional  left lower quadrant pain.   PAST MEDICAL HISTORY:  Migraines.   SURGICAL HISTORY:  Negative.   SOCIAL HISTORY:  Tobacco use, however, she quit in mid-February 2008,  using the nicotine patch.   MEDICATIONS:  Yasmin, Effexor.   GYNECOLOGY HISTORY:  Normal Pap smears.   FAMILY HISTORY:  Noncontributory.   ALLERGIES:  POLLEN.   OBSTETRICAL HISTORY:  Negative.   Shameca is single.   PHYSICAL EXAMINATION:  VITAL SIGNS:  Height 5 feet 6 inches, weight 192,  blood pressure 112/78.  HEAD/NECK:  Normal.  No thyromegaly or nodularity.  HEART:  Regular rate and rhythm.  LUNGS:  Clear bilaterally.  ABDOMEN:  Soft and nontender.  PELVIC:  Shows normal external female genitalia.  Vagina and cervix are  normal.  No lesions.  Uterus is mobile and nontender.  No adnexal masses  are noted.   Transvaginal ultrasound shows normal-appearing adnexa bilaterally.  There is no fluid in the cul-de-sac noted.  There is a density noted  within the endometrial cavity, however, after saline infusion it is  noted to be a 13-mm polypoid mass.   ASSESSMENT/PLAN:  A 35 year old with  possible endometrial polyp.  Risks,  benefits, and alternatives of hysteroscopy D&C with resectoscope were  discussed.      Zelphia Cairo, MD  Electronically Signed     GA/MEDQ  D:  11/17/2006  T:  11/17/2006  Job:  [id number]

## 2010-11-10 ENCOUNTER — Ambulatory Visit (HOSPITAL_COMMUNITY)
Admission: EM | Admit: 2010-11-10 | Discharge: 2010-11-10 | Disposition: A | Discharge status: Home / Self Care | Payer: BC Managed Care – PPO | Source: Ambulatory Visit | Attending: Obstetrics and Gynecology | Admitting: Obstetrics and Gynecology

## 2010-11-10 ENCOUNTER — Other Ambulatory Visit: Payer: Self-pay | Admitting: Obstetrics and Gynecology

## 2010-11-10 DIAGNOSIS — N938 Other specified abnormal uterine and vaginal bleeding: Secondary | ICD-10-CM | POA: Insufficient documentation

## 2010-11-10 DIAGNOSIS — N840 Polyp of corpus uteri: Secondary | ICD-10-CM | POA: Insufficient documentation

## 2010-11-10 DIAGNOSIS — N949 Unspecified condition associated with female genital organs and menstrual cycle: Secondary | ICD-10-CM | POA: Insufficient documentation

## 2010-11-10 LAB — CBC
HCT: 37.7 % (ref 36.0–46.0)
Hemoglobin: 12 g/dL (ref 12.0–15.0)
MCH: 29.2 pg (ref 26.0–34.0)
Platelets: 266 10*3/uL (ref 150–400)
RBC: 4.11 MIL/uL (ref 3.87–5.11)
WBC: 8.5 10*3/uL (ref 4.0–10.5)

## 2010-11-10 LAB — HCG, SERUM, QUALITATIVE: Preg, Serum: NEGATIVE

## 2010-11-13 NOTE — Op Note (Signed)
  Cheryl [name], Cheryl [name]          ACCOUNT NO.:  [id number]  MEDICAL RECORD NO.:  [id number]           PATIENT TYPE:  O  LOCATION:  [facility]                          [facility]  [facility]  PHYSICIAN:  Zelphia Cairo, MD    DATE OF BIRTH:  1976/04/27  DATE OF PROCEDURE:  11/10/2010 DATE OF DISCHARGE:                              OPERATIVE REPORT   PREOPERATIVE DIAGNOSES: 1. Irregular bleeding. 2. Endometrial mass.  POSTOPERATIVE DIAGNOSIS: 1. Irregular bleeding. 2. Endometrial mass. 3. Path pending.  PROCEDURE: 1. Cervical block. 2. Hysteroscopy. 3. D&C.  SURGEON:  Zelphia Cairo, MD  ANESTHESIA:  MAC with local.  SPECIMEN:  Endometrial curettings to Pathology.  FLUID DEFICIT:  65 mL.  COMPLICATIONS:  None.  CONDITION:  Stable to recovery room.  PROCEDURE DESCRIPTION:  The patient was taken to the operating room where anesthesia was found to be adequate.  She was placed in the dorsal lithotomy position using Allen stirrups, prepped and draped in sterile fashion.  An in-and-out catheter was used to drain her bladder.  Bivalve speculum was placed in the vagina and 1 mL of 1% lidocaine was placed at 12 o'clock on the cervix.  A single-tooth tenaculum was attached to the cervix and a cervical block was performed.  The cervix was then serially dilated using Pratt dilators and the diagnostic hysteroscope was inserted into the endometrial cavity.  Fluffy appearing polypoid tissue was noted throughout.  There was noted to be a polypoid appearing endometrial mass located in the posterior left endometrial cavity. Hysteroscope was removed and a gentle curetting was performed.  Specimen was placed on Telfa and passed off to be sent to pathology.  Diagnostic hysteroscope was reinserted.  Once no further polypoid masses or irregular tissue was noted, the hysteroscope was removed.  Tenaculum was removed.  The cervix was found to be hemostatic.  The speculum was removed.  Sponge lap,  needle, and instrument counts were correct x2.  She was taken to the recovery room in stable condition.     Zelphia Cairo, MD     GA/MEDQ  D:  11/10/2010  T:  11/10/2010  Job:  [id number]  Electronically Signed by Zelphia Cairo MD on 11/13/2010 11:11:39 AM

## 2010-12-01 ENCOUNTER — Other Ambulatory Visit: Payer: Self-pay | Admitting: Family Medicine

## 2010-12-02 MED ORDER — LEVOTHYROXINE SODIUM 112 MCG PO TABS: 112.0000 ug | ORAL_TABLET | Freq: Every day | ORAL | Status: DC

## 2010-12-02 NOTE — Telephone Encounter (Signed)
Rx faxed.    KP 

## 2010-12-18 ENCOUNTER — Other Ambulatory Visit: Payer: Self-pay | Admitting: Family Medicine

## 2010-12-18 MED ORDER — LEVOTHYROXINE SODIUM 112 MCG PO TABS: 112.0000 ug | ORAL_TABLET | Freq: Every day | ORAL | Status: DC

## 2010-12-18 NOTE — Telephone Encounter (Signed)
Rx faxed.    KP 

## 2011-02-23 ENCOUNTER — Other Ambulatory Visit: Payer: Self-pay | Admitting: Family Medicine

## 2011-02-23 MED ORDER — LEVOTHYROXINE SODIUM 112 MCG PO TABS: 112.0000 ug | ORAL_TABLET | Freq: Every day | ORAL | Status: DC

## 2011-02-23 NOTE — Telephone Encounter (Signed)
KP

## 2011-02-26 ENCOUNTER — Other Ambulatory Visit: Payer: Self-pay | Admitting: Family Medicine

## 2011-02-26 DIAGNOSIS — E039 Hypothyroidism, unspecified: Secondary | ICD-10-CM

## 2011-03-01 ENCOUNTER — Other Ambulatory Visit (INDEPENDENT_AMBULATORY_CARE_PROVIDER_SITE_OTHER): Payer: BC Managed Care – PPO

## 2011-03-01 DIAGNOSIS — E039 Hypothyroidism, unspecified: Secondary | ICD-10-CM

## 2011-04-05 ENCOUNTER — Other Ambulatory Visit: Payer: Self-pay | Admitting: Family Medicine

## 2011-10-20 HISTORY — PX: INTRAUTERINE DEVICE (IUD) INSERTION: SHX5877

## 2011-12-03 ENCOUNTER — Other Ambulatory Visit: Payer: Self-pay | Admitting: Family Medicine

## 2012-01-17 ENCOUNTER — Other Ambulatory Visit: Payer: Self-pay | Admitting: Family Medicine

## 2012-01-17 MED ORDER — LEVOTHYROXINE SODIUM 112 MCG PO TABS: 112.0000 ug | ORAL_TABLET | Freq: Every day | ORAL | Status: DC

## 2012-01-17 NOTE — Telephone Encounter (Signed)
Please offer this patient an apt for a CPE- if she has any issues you can send me the call.   TY     KP

## 2012-01-17 NOTE — Addendum Note (Signed)
Addended by: Arnette Norris on: 01/17/2012 11:52 AM   Modules accepted: Orders

## 2012-01-17 NOTE — Telephone Encounter (Signed)
appt made 9.16.13 2pm

## 2012-01-17 NOTE — Telephone Encounter (Signed)
Last seen 04/28/10 and labs done 03/08/11 no pending apt. Please advise     KP

## 2012-03-06 ENCOUNTER — Encounter: Payer: Self-pay | Admitting: Family Medicine

## 2012-03-06 ENCOUNTER — Ambulatory Visit (INDEPENDENT_AMBULATORY_CARE_PROVIDER_SITE_OTHER): Payer: BC Managed Care – PPO | Admitting: Family Medicine

## 2012-03-06 VITALS — BP 118/82 | HR 97 | Temp 98.6°F | Ht 66.5 in | Wt 211.2 lb

## 2012-03-06 DIAGNOSIS — R059 Cough, unspecified: Secondary | ICD-10-CM

## 2012-03-06 DIAGNOSIS — F3189 Other bipolar disorder: Secondary | ICD-10-CM

## 2012-03-06 DIAGNOSIS — R05 Cough: Secondary | ICD-10-CM

## 2012-03-06 DIAGNOSIS — E039 Hypothyroidism, unspecified: Secondary | ICD-10-CM

## 2012-03-06 DIAGNOSIS — F3181 Bipolar II disorder: Secondary | ICD-10-CM

## 2012-03-06 DIAGNOSIS — Z309 Encounter for contraceptive management, unspecified: Secondary | ICD-10-CM

## 2012-03-06 DIAGNOSIS — IMO0001 Reserved for inherently not codable concepts without codable children: Secondary | ICD-10-CM

## 2012-03-06 DIAGNOSIS — F17200 Nicotine dependence, unspecified, uncomplicated: Secondary | ICD-10-CM

## 2012-03-06 DIAGNOSIS — Z0000 Encounter for general adult medical examination without abnormal findings: Secondary | ICD-10-CM

## 2012-03-06 DIAGNOSIS — Z720 Tobacco use: Secondary | ICD-10-CM

## 2012-03-06 MED ORDER — LEVONORGESTREL 20 MCG/24HR IU IUD: 1.0000 | INTRAUTERINE_SYSTEM | Freq: Once | INTRAUTERINE | Status: DC

## 2012-03-06 MED ORDER — LEVOTHYROXINE SODIUM 112 MCG PO TABS: 112.0000 ug | ORAL_TABLET | Freq: Every day | ORAL | Status: DC

## 2012-03-06 NOTE — Patient Instructions (Addendum)
Preventive Care for Adults, Female A healthy lifestyle and preventive care can promote health and wellness. Preventive health guidelines for women include the following key practices.  A routine yearly physical is a good way to check with your caregiver about your health and preventive screening. It is a chance to share any concerns and updates on your health, and to receive a thorough exam.   Visit your dentist for a routine exam and preventive care every 6 months. Brush your teeth twice a day and floss once a day. Good oral hygiene prevents tooth decay and gum disease.   The frequency of eye exams is based on your age, health, family medical history, use of contact lenses, and other factors. Follow your caregiver's recommendations for frequency of eye exams.   Eat a healthy diet. Foods like vegetables, fruits, whole grains, low-fat dairy products, and lean protein foods contain the nutrients you need without too many calories. Decrease your intake of foods high in solid fats, added sugars, and salt. Eat the right amount of calories for you.Get information about a proper diet from your caregiver, if necessary.   Regular physical exercise is one of the most important things you can do for your health. Most adults should get at least 150 minutes of moderate-intensity exercise (any activity that increases your heart rate and causes you to sweat) each week. In addition, most adults need muscle-strengthening exercises on 2 or more days a week.   Maintain a healthy weight. The body mass index (BMI) is a screening tool to identify possible weight problems. It provides an estimate of body fat based on height and weight. Your caregiver can help determine your BMI, and can help you achieve or maintain a healthy weight.For adults 20 years and older:   A BMI below 18.5 is considered underweight.   A BMI of 18.5 to 24.9 is normal.   A BMI of 25 to 29.9 is considered overweight.   A BMI of 30 and above is  considered obese.   Maintain normal blood lipids and cholesterol levels by exercising and minimizing your intake of saturated fat. Eat a balanced diet with plenty of fruit and vegetables. Blood tests for lipids and cholesterol should begin at age 20 and be repeated every 5 years. If your lipid or cholesterol levels are high, you are over 50, or you are at high risk for heart disease, you may need your cholesterol levels checked more frequently.Ongoing high lipid and cholesterol levels should be treated with medicines if diet and exercise are not effective.   If you smoke, find out from your caregiver how to quit. If you do not use tobacco, do not start.   If you are pregnant, do not drink alcohol. If you are breastfeeding, be very cautious about drinking alcohol. If you are not pregnant and choose to drink alcohol, do not exceed 1 drink per day. One drink is considered to be 12 ounces (355 mL) of beer, 5 ounces (148 mL) of wine, or 1.5 ounces (44 mL) of liquor.   Avoid use of street drugs. Do not share needles with anyone. Ask for help if you need support or instructions about stopping the use of drugs.   High blood pressure causes heart disease and increases the risk of stroke. Your blood pressure should be checked at least every 1 to 2 years. Ongoing high blood pressure should be treated with medicines if weight loss and exercise are not effective.   If you are 55 to 36   years old, ask your caregiver if you should take aspirin to prevent strokes.   Diabetes screening involves taking a blood sample to check your fasting blood sugar level. This should be done once every 3 years, after age 45, if you are within normal weight and without risk factors for diabetes. Testing should be considered at a younger age or be carried out more frequently if you are overweight and have at least 1 risk factor for diabetes.   Breast cancer screening is essential preventive care for women. You should practice "breast  self-awareness." This means understanding the normal appearance and feel of your breasts and may include breast self-examination. Any changes detected, no matter how small, should be reported to a caregiver. Women in their 20s and 30s should have a clinical breast exam (CBE) by a caregiver as part of a regular health exam every 1 to 3 years. After age 40, women should have a CBE every year. Starting at age 40, women should consider having a mammography (breast X-ray test) every year. Women who have a family history of breast cancer should talk to their caregiver about genetic screening. Women at a high risk of breast cancer should talk to their caregivers about having magnetic resonance imaging (MRI) and a mammography every year.   The Pap test is a screening test for cervical cancer. A Pap test can show cell changes on the cervix that might become cervical cancer if left untreated. A Pap test is a procedure in which cells are obtained and examined from the lower end of the uterus (cervix).   Women should have a Pap test starting at age 21.   Between ages 21 and 29, Pap tests should be repeated every 2 years.   Beginning at age 30, you should have a Pap test every 3 years as long as the past 3 Pap tests have been normal.   Some women have medical problems that increase the chance of getting cervical cancer. Talk to your caregiver about these problems. It is especially important to talk to your caregiver if a new problem develops soon after your last Pap test. In these cases, your caregiver may recommend more frequent screening and Pap tests.   The above recommendations are the same for women who have or have not gotten the vaccine for human papillomavirus (HPV).   If you had a hysterectomy for a problem that was not cancer or a condition that could lead to cancer, then you no longer need Pap tests. Even if you no longer need a Pap test, a regular exam is a good idea to make sure no other problems are  starting.   If you are between ages 65 and 70, and you have had normal Pap tests going back 10 years, you no longer need Pap tests. Even if you no longer need a Pap test, a regular exam is a good idea to make sure no other problems are starting.   If you have had past treatment for cervical cancer or a condition that could lead to cancer, you need Pap tests and screening for cancer for at least 20 years after your treatment.   If Pap tests have been discontinued, risk factors (such as a new sexual partner) need to be reassessed to determine if screening should be resumed.   The HPV test is an additional test that may be used for cervical cancer screening. The HPV test looks for the virus that can cause the cell changes on the cervix.   The cells collected during the Pap test can be tested for HPV. The HPV test could be used to screen women aged 30 years and older, and should be used in women of any age who have unclear Pap test results. After the age of 30, women should have HPV testing at the same frequency as a Pap test.   Colorectal cancer can be detected and often prevented. Most routine colorectal cancer screening begins at the age of 50 and continues through age 75. However, your caregiver may recommend screening at an earlier age if you have risk factors for colon cancer. On a yearly basis, your caregiver may provide home test kits to check for hidden blood in the stool. Use of a small camera at the end of a tube, to directly examine the colon (sigmoidoscopy or colonoscopy), can detect the earliest forms of colorectal cancer. Talk to your caregiver about this at age 50, when routine screening begins. Direct examination of the colon should be repeated every 5 to 10 years through age 75, unless early forms of pre-cancerous polyps or small growths are found.   Hepatitis C blood testing is recommended for all people born from 1945 through 1965 and any individual with known risks for hepatitis C.    Practice safe sex. Use condoms and avoid high-risk sexual practices to reduce the spread of sexually transmitted infections (STIs). STIs include gonorrhea, chlamydia, syphilis, trichomonas, herpes, HPV, and human immunodeficiency virus (HIV). Herpes, HIV, and HPV are viral illnesses that have no cure. They can result in disability, cancer, and death. Sexually active women aged 25 and younger should be checked for chlamydia. Older women with new or multiple partners should also be tested for chlamydia. Testing for other STIs is recommended if you are sexually active and at increased risk.   Osteoporosis is a disease in which the bones lose minerals and strength with aging. This can result in serious bone fractures. The risk of osteoporosis can be identified using a bone density scan. Women ages 65 and over and women at risk for fractures or osteoporosis should discuss screening with their caregivers. Ask your caregiver whether you should take a calcium supplement or vitamin D to reduce the rate of osteoporosis.   Menopause can be associated with physical symptoms and risks. Hormone replacement therapy is available to decrease symptoms and risks. You should talk to your caregiver about whether hormone replacement therapy is right for you.   Use sunscreen with sun protection factor (SPF) of 30 or more. Apply sunscreen liberally and repeatedly throughout the day. You should seek shade when your shadow is shorter than you. Protect yourself by wearing long sleeves, pants, a wide-brimmed hat, and sunglasses year round, whenever you are outdoors.   Once a month, do a whole body skin exam, using a mirror to look at the skin on your back. Notify your caregiver of new moles, moles that have irregular borders, moles that are larger than a pencil eraser, or moles that have changed in shape or color.   Stay current with required immunizations.   Influenza. You need a dose every fall (or winter). The composition of  the flu vaccine changes each year, so being vaccinated once is not enough.   Pneumococcal polysaccharide. You need 1 to 2 doses if you smoke cigarettes or if you have certain chronic medical conditions. You need 1 dose at age 65 (or older) if you have never been vaccinated.   Tetanus, diphtheria, pertussis (Tdap, Td). Get 1 dose of   Tdap vaccine if you are younger than age 65, are over 65 and have contact with an infant, are a healthcare worker, are pregnant, or simply want to be protected from whooping cough. After that, you need a Td booster dose every 10 years. Consult your caregiver if you have not had at least 3 tetanus and diphtheria-containing shots sometime in your life or have a deep or dirty wound.   HPV. You need this vaccine if you are a woman age 26 or younger. The vaccine is given in 3 doses over 6 months.   Measles, mumps, rubella (MMR). You need at least 1 dose of MMR if you were born in 1957 or later. You may also need a second dose.   Meningococcal. If you are age 19 to 21 and a first-year college student living in a residence hall, or have one of several medical conditions, you need to get vaccinated against meningococcal disease. You may also need additional booster doses.   Zoster (shingles). If you are age 60 or older, you should get this vaccine.   Varicella (chickenpox). If you have never had chickenpox or you were vaccinated but received only 1 dose, talk to your caregiver to find out if you need this vaccine.   Hepatitis A. You need this vaccine if you have a specific risk factor for hepatitis A virus infection or you simply wish to be protected from this disease. The vaccine is usually given as 2 doses, 6 to 18 months apart.   Hepatitis B. You need this vaccine if you have a specific risk factor for hepatitis B virus infection or you simply wish to be protected from this disease. The vaccine is given in 3 doses, usually over 6 months.  Preventive Services /  Frequency Ages 19 to 39  Blood pressure check.** / Every 1 to 2 years.   Lipid and cholesterol check.** / Every 5 years beginning at age 20.   Clinical breast exam.** / Every 3 years for women in their 20s and 30s.   Pap test.** / Every 2 years from ages 21 through 29. Every 3 years starting at age 30 through age 65 or 70 with a history of 3 consecutive normal Pap tests.   HPV screening.** / Every 3 years from ages 30 through ages 65 to 70 with a history of 3 consecutive normal Pap tests.   Hepatitis C blood test.** / For any individual with known risks for hepatitis C.   Skin self-exam. / Monthly.   Influenza immunization.** / Every year.   Pneumococcal polysaccharide immunization.** / 1 to 2 doses if you smoke cigarettes or if you have certain chronic medical conditions.   Tetanus, diphtheria, pertussis (Tdap, Td) immunization. / A one-time dose of Tdap vaccine. After that, you need a Td booster dose every 10 years.   HPV immunization. / 3 doses over 6 months, if you are 26 and younger.   Measles, mumps, rubella (MMR) immunization. / You need at least 1 dose of MMR if you were born in 1957 or later. You may also need a second dose.   Meningococcal immunization. / 1 dose if you are age 19 to 21 and a first-year college student living in a residence hall, or have one of several medical conditions, you need to get vaccinated against meningococcal disease. You may also need additional booster doses.   Varicella immunization.** / Consult your caregiver.   Hepatitis A immunization.** / Consult your caregiver. 2 doses, 6 to 18 months   apart.   Hepatitis B immunization.** / Consult your caregiver. 3 doses usually over 6 months.  Ages 40 to 64  Blood pressure check.** / Every 1 to 2 years.   Lipid and cholesterol check.** / Every 5 years beginning at age 20.   Clinical breast exam.** / Every year after age 40.   Mammogram.** / Every year beginning at age 40 and continuing for as  long as you are in good health. Consult with your caregiver.   Pap test.** / Every 3 years starting at age 30 through age 65 or 70 with a history of 3 consecutive normal Pap tests.   HPV screening.** / Every 3 years from ages 30 through ages 65 to 70 with a history of 3 consecutive normal Pap tests.   Fecal occult blood test (FOBT) of stool. / Every year beginning at age 50 and continuing until age 75. You may not need to do this test if you get a colonoscopy every 10 years.   Flexible sigmoidoscopy or colonoscopy.** / Every 5 years for a flexible sigmoidoscopy or every 10 years for a colonoscopy beginning at age 50 and continuing until age 75.   Hepatitis C blood test.** / For all people born from 1945 through 1965 and any individual with known risks for hepatitis C.   Skin self-exam. / Monthly.   Influenza immunization.** / Every year.   Pneumococcal polysaccharide immunization.** / 1 to 2 doses if you smoke cigarettes or if you have certain chronic medical conditions.   Tetanus, diphtheria, pertussis (Tdap, Td) immunization.** / A one-time dose of Tdap vaccine. After that, you need a Td booster dose every 10 years.   Measles, mumps, rubella (MMR) immunization. / You need at least 1 dose of MMR if you were born in 1957 or later. You may also need a second dose.   Varicella immunization.** / Consult your caregiver.   Meningococcal immunization.** / Consult your caregiver.   Hepatitis A immunization.** / Consult your caregiver. 2 doses, 6 to 18 months apart.   Hepatitis B immunization.** / Consult your caregiver. 3 doses, usually over 6 months.  Ages 65 and over  Blood pressure check.** / Every 1 to 2 years.   Lipid and cholesterol check.** / Every 5 years beginning at age 20.   Clinical breast exam.** / Every year after age 40.   Mammogram.** / Every year beginning at age 40 and continuing for as long as you are in good health. Consult with your caregiver.   Pap test.** /  Every 3 years starting at age 30 through age 65 or 70 with a 3 consecutive normal Pap tests. Testing can be stopped between 65 and 70 with 3 consecutive normal Pap tests and no abnormal Pap or HPV tests in the past 10 years.   HPV screening.** / Every 3 years from ages 30 through ages 65 or 70 with a history of 3 consecutive normal Pap tests. Testing can be stopped between 65 and 70 with 3 consecutive normal Pap tests and no abnormal Pap or HPV tests in the past 10 years.   Fecal occult blood test (FOBT) of stool. / Every year beginning at age 50 and continuing until age 75. You may not need to do this test if you get a colonoscopy every 10 years.   Flexible sigmoidoscopy or colonoscopy.** / Every 5 years for a flexible sigmoidoscopy or every 10 years for a colonoscopy beginning at age 50 and continuing until age 75.   Hepatitis   C blood test.** / For all people born from 1945 through 1965 and any individual with known risks for hepatitis C.   Osteoporosis screening.** / A one-time screening for women ages 65 and over and women at risk for fractures or osteoporosis.   Skin self-exam. / Monthly.   Influenza immunization.** / Every year.   Pneumococcal polysaccharide immunization.** / 1 dose at age 65 (or older) if you have never been vaccinated.   Tetanus, diphtheria, pertussis (Tdap, Td) immunization. / A one-time dose of Tdap vaccine if you are over 65 and have contact with an infant, are a healthcare worker, or simply want to be protected from whooping cough. After that, you need a Td booster dose every 10 years.   Varicella immunization.** / Consult your caregiver.   Meningococcal immunization.** / Consult your caregiver.   Hepatitis A immunization.** / Consult your caregiver. 2 doses, 6 to 18 months apart.   Hepatitis B immunization.** / Check with your caregiver. 3 doses, usually over 6 months.  ** Family history and personal history of risk and conditions may change your caregiver's  recommendations. Document Released: 08/03/2001 Document Revised: 05/27/2011 Document Reviewed: 11/02/2010 [employer] Patient Information 2012 [employer]. 

## 2012-03-06 NOTE — Assessment & Plan Note (Signed)
Per psych 

## 2012-03-06 NOTE — Assessment & Plan Note (Signed)
Check labs 

## 2012-03-06 NOTE — Progress Notes (Signed)
  Subjective:     Cheryl [name] is a 36 y.o. female and is here for a comprehensive physical exam. The patient reports no problems.  History   Social History  . Marital Status: Single    Spouse Name: N/A    Number of Children: N/A  . Years of Education: N/A   Occupational History  . [employer]    Social History Main Topics  . Smoking status: Current Every Day Smoker -- 0.8 packs/day for 15 years    Types: Cigarettes  . Smokeless tobacco: Never Used   Comment: weaning down  . Alcohol Use: No  . Drug Use: No  . Sexually Active: Yes -- Female partner(s)    Birth Control/ Protection: IUD   Other Topics Concern  . Not on file   Social History Narrative   Exercise-- no   Health Maintenance  Topic Date Due  . Influenza Vaccine  03/21/2012  . Tetanus/tdap  03/06/2014  . Pap Smear  08/20/2014    The following portions of the patient's history were reviewed and updated as appropriate: allergies, current medications, past family history, past medical history, past social history, past surgical history and problem list.  Review of Systems Review of Systems  Constitutional: Negative for activity change, appetite change and fatigue.  HENT: Negative for hearing loss, congestion, tinnitus and ear discharge.  dentist q49m Eyes: Negative for visual disturbance (see optho q1y -- vision corrected to 20/20 with glasses).  Respiratory: Negative for cough, chest tightness and shortness of breath.   Cardiovascular: Negative for chest pain, palpitations and leg swelling.  Gastrointestinal: Negative for abdominal pain, diarrhea, constipation and abdominal distention.  Genitourinary: Negative for urgency, frequency, decreased urine volume and difficulty urinating.  Musculoskeletal: Negative for back pain, arthralgias and gait problem.  Skin: Negative for color change, pallor and rash.  Neurological: Negative for dizziness, light-headedness, numbness and headaches.    Hematological: Negative for adenopathy. Does not bruise/bleed easily.  Psychiatric/Behavioral: Negative for suicidal ideas, confusion, sleep disturbance, self-injury, dysphoric mood, decreased concentration and agitation.       Objective:    BP 118/82  Pulse 97  Temp 98.6 F (37 C) (Oral)  Ht 5' 6.5" (1.689 m)  Wt 211 lb 3.2 oz (95.8 kg)  BMI 33.58 kg/m2  SpO2 97% General appearance: alert, cooperative, appears stated age and no distress Head: Normocephalic, without obvious abnormality, atraumatic Eyes: conjunctivae/corneas clear. PERRL, EOM's intact. Fundi benign. Ears: normal TM's and external ear canals both ears Nose: Nares normal. Septum midline. Mucosa normal. No drainage or sinus tenderness. Throat: lips, mucosa, and tongue normal; teeth and gums normal Neck: no adenopathy, supple, symmetrical, trachea midline and thyroid not enlarged, symmetric, no tenderness/mass/nodules Back: symmetric, no curvature. ROM normal. No CVA tenderness. Lungs: clear to auscultation bilaterally Breasts: gyn Heart: regular rate and rhythm, S1, S2 normal, no murmur, click, rub or gallop Abdomen: soft, non-tender; bowel sounds normal; no masses,  no organomegaly Pelvic: deferred--deferred Extremities: extremities normal, atraumatic, no cyanosis or edema Pulses: 2+ and symmetric Skin: Skin color, texture, turgor normal. No rashes or lesions Lymph nodes: Cervical, supraclavicular, and axillary nodes normal. Neurologic: Alert and oriented X 3, normal strength and tone. Normal symmetric reflexes. Normal coordination and gait psych-- per psych    Assessment:    Healthy female exam.      Plan:    ghm utd Check labs See After Visit Summary for Counseling Recommendations

## 2012-03-14 ENCOUNTER — Other Ambulatory Visit (INDEPENDENT_AMBULATORY_CARE_PROVIDER_SITE_OTHER): Payer: Self-pay

## 2012-03-14 DIAGNOSIS — Z0000 Encounter for general adult medical examination without abnormal findings: Secondary | ICD-10-CM

## 2012-03-14 DIAGNOSIS — E039 Hypothyroidism, unspecified: Secondary | ICD-10-CM

## 2012-03-14 DIAGNOSIS — Z13220 Encounter for screening for lipoid disorders: Secondary | ICD-10-CM

## 2012-03-14 LAB — CBC WITH DIFFERENTIAL/PLATELET
Basophils Absolute: 0 10*3/uL (ref 0.0–0.1)
Lymphocytes Relative: 35.4 % (ref 12.0–46.0)
Monocytes Relative: 6.6 % (ref 3.0–12.0)
Neutrophils Relative %: 56 % (ref 43.0–77.0)
Platelets: 225 10*3/uL (ref 150.0–400.0)
RDW: 12.5 % (ref 11.5–14.6)

## 2012-03-14 NOTE — Progress Notes (Unsigned)
Pt could not use the bathroom so took specimen cup with her.

## 2012-03-15 LAB — LIPID PANEL: Triglycerides: 318 mg/dL — ABNORMAL HIGH (ref 0.0–149.0)

## 2012-03-15 LAB — HEPATIC FUNCTION PANEL
ALT: 22 U/L (ref 0–35)
AST: 21 U/L (ref 0–37)
Total Bilirubin: 0.4 mg/dL (ref 0.3–1.2)

## 2012-03-15 LAB — BASIC METABOLIC PANEL
BUN: 14 mg/dL (ref 6–23)
Calcium: 8.7 mg/dL (ref 8.4–10.5)
Creatinine, Ser: 0.7 mg/dL (ref 0.4–1.2)
GFR: 95.73 mL/min (ref >60.00)
Glucose, Bld: 136 mg/dL — ABNORMAL HIGH (ref 70–99)
Sodium: 138 mEq/L (ref 135–145)

## 2012-03-27 ENCOUNTER — Telehealth: Payer: Self-pay

## 2012-03-27 MED ORDER — LEVOTHYROXINE SODIUM 125 MCG PO TABS: 125.0000 ug | ORAL_TABLET | Freq: Every day | ORAL | Status: DC

## 2012-03-27 NOTE — Telephone Encounter (Signed)
Message copied by Arnette Norris on Mon Mar 27, 2012  9:47 AM ------      Message from: Lelon Perla      Created: Mon Mar 20, 2012  9:22 PM       Hypothyroid---  Inc levothyroxine to 125 daily  #30  2 refills-- recheck  2 months   244.9  TSH      Glucose is elevated----recheck fasting with hgba1c  790.6  Bmp, hgba1c      Cholesterol--- LDL goal < 100 if not diabetic,  HDL >40,  TG < 150.  Diet and exercise will increase HDL and decrease LDL and TG.  Fish,  Fish Oil, Flaxseed oil will also help increase the HDL and decrease Triglycerides.  Lipitor 10 mg  #30  1 po qhs---- to help prevent heart attack and stroke.   Recheck labs in 3 months.===== 272.4 790.6  Lipid, hep, bmp, hgba1c

## 2012-03-27 NOTE — Addendum Note (Signed)
Addended by: Arnette Norris on: 03/27/2012 02:34 PM   Modules accepted: Orders

## 2012-03-27 NOTE — Telephone Encounter (Signed)
msg left at work number.     KP

## 2012-03-27 NOTE — Telephone Encounter (Signed)
Rx faxed.    KP 

## 2012-03-27 NOTE — Telephone Encounter (Signed)
Spoke with pt concerning lab results and scheduled 3 month recheck -272.4 790.6 Lipid, hep, bmp, hgba1c MW

## 2012-06-12 ENCOUNTER — Telehealth: Payer: Self-pay | Admitting: Family Medicine

## 2012-06-12 NOTE — Telephone Encounter (Signed)
Please call pt--- this should not have been sent to me since I'm not there

## 2012-06-12 NOTE — Telephone Encounter (Signed)
lmovm for pt to return call. Please advise.

## 2012-06-12 NOTE — Telephone Encounter (Signed)
[facility] Triage Call Report Triage Record Num: [id number] Operator: Hyman Bower Patient Name: Cheryl [name] Call Date & Time: 06/10/2012 2:13:01PM Patient Phone: [phone] PCP: Lelon Perla Patient Gender: Female PCP Fax : [phone] Patient DOB: 1976-06-11 Practice Name: [facility] Reason for Call: Caller: Shaida/Patient; PCP: Lelon Perla.; CB#: [phone]; Call regarding Vomiting; Onset 06/10/12. LMP: 06/06/12. Reports she is vomiting anything that she drinks or eats. Afebrile. Triaged per Nausea or Vomiting guideline, disposition: provide home/self care for "All other situations." Called in per standing orders Phenergan 25mg  suppositories PR every 4 hours prn nausea/vomiting, dispense 6 with no refills. [facility] Phone: [phone]. Care advice and call back parameters given per guideline. Patient verbalized understanding. Protocol(s) Used: Nausea or Vomiting Recommended Outcome per Protocol: Provide Home/Self Care Reason for Outcome: All other situations Care Advice: Call provider if symptoms continue for 24 hours, blood in vomit, severe abdominal pain, fever over 101.5 F (38.6 C), rapid breathing or pulse, or severe vomiting with diarrhea. ~ ~ SYMPTOM / CONDITION MANAGEMENT Nausea Care Advice: - Drink small amounts of clear, sweetened liquids or ice cold drinks. - Eat light, bland foods such as saltine crackers or plain bread. - Do not eat high fat, highly seasoned, high fiber, or high sugar content foods. - Avoid mixing hot food and cold foods. - Eat smaller, more frequent meals. - Rest as much as possible in a sitting or in a propped lying position. Do not lie flat for at least 2 hours after eating. - Do not take pain medication (such as aspirin, NSAIDs) while nauseated. - Rest as much as possible until symptoms improve since activity may worsen nausea. ~ Go to the ED if you have developed signs and  symptoms of dehydration such as very dry mouth and tongue; increased pulse rate at rest; no urine output for 8 hours or more; increasing weakness or drowsiness, or lightheadedness when trying to sit upright or standing. ~ Vomiting Care Advice: - Do not eat solid foods until vomiting subsides. - Begin taking fluids by sucking on ice chips or popsicles or taking sips of cool clear, nonprescription oral rehydration solution). - Gradually drink larger amounts of these fluids so that you are drinking six to eight 8 oz. (.2 liter) of fluids a day. - Keep activity to a minimum. - After vomiting subsides, eat smaller, more frequent meals of easily digested foods such as crackers, toast, bananas, rice, cooked cereal, applesauce, broth, baked or mashed potatoes, chicken or [place] without skin. Eat slowly. - Take fluids 30 minutes before or 60 minutes after meals. - Avoid high fat, highly seasoned, high fiber or high sugar content foods. - Avoid extremely hot or cold foods. - Do not take pain medication (such as aspirin, NSAIDs) while nauseated or vomiting. - Consult your provider for advice regarding continuing prescription medication. - Rest as much as possible in a sitting or in a propped lying position. Do not lie flat for at least 2 hours after eatin

## 2012-06-13 NOTE — Telephone Encounter (Signed)
Left message to call office

## 2012-06-13 NOTE — Telephone Encounter (Signed)
Please call pt to see how she is feeling- may need OV if not able to hold down fluids

## 2012-06-19 ENCOUNTER — Other Ambulatory Visit (INDEPENDENT_AMBULATORY_CARE_PROVIDER_SITE_OTHER): Payer: BC Managed Care – PPO

## 2012-06-19 DIAGNOSIS — E785 Hyperlipidemia, unspecified: Secondary | ICD-10-CM

## 2012-06-19 DIAGNOSIS — R7989 Other specified abnormal findings of blood chemistry: Secondary | ICD-10-CM

## 2012-06-19 LAB — HEPATIC FUNCTION PANEL
Bilirubin, Direct: 0 mg/dL (ref 0.0–0.3)
Total Protein: 7.3 g/dL (ref 6.0–8.3)

## 2012-06-19 LAB — LIPID PANEL
Cholesterol: 145 mg/dL (ref 0–200)
LDL Cholesterol: 82 mg/dL (ref 0–99)
Total CHOL/HDL Ratio: 5
VLDL: 33.6 mg/dL (ref 0.0–40.0)

## 2012-06-19 LAB — BASIC METABOLIC PANEL
BUN: 17 mg/dL (ref 6–23)
CO2: 27 mEq/L (ref 19–32)
Chloride: 105 mEq/L (ref 96–112)
Potassium: 3.9 mEq/L (ref 3.5–5.1)

## 2012-06-19 LAB — HEMOGLOBIN A1C: Hgb A1c MFr Bld: 6.4 % (ref 4.6–6.5)

## 2012-06-20 ENCOUNTER — Encounter: Payer: Self-pay | Admitting: *Deleted

## 2012-06-20 NOTE — Telephone Encounter (Signed)
Left message to call office. Letter Mail  

## 2012-06-20 NOTE — Telephone Encounter (Signed)
Pt return call stating that symptoms resolve the next morning and she is doing well now no need to be seen.

## 2012-07-27 ENCOUNTER — Other Ambulatory Visit: Payer: Self-pay | Admitting: Family Medicine

## 2012-09-04 ENCOUNTER — Telehealth: Payer: Self-pay | Admitting: Family Medicine

## 2012-09-04 MED ORDER — LEVOTHYROXINE SODIUM 125 MCG PO TABS: ORAL_TABLET | ORAL | Status: DC

## 2012-09-04 NOTE — Telephone Encounter (Signed)
refill Levothyroxine (Tab) 125 MCG TAKE 1 TABLET (125 MCG TOTAL) BY MOUTH DAILY.  #30 last fill 2.6.14

## 2012-10-09 ENCOUNTER — Ambulatory Visit: Payer: Self-pay | Admitting: Certified Nurse Midwife

## 2012-10-11 ENCOUNTER — Encounter: Payer: Self-pay | Admitting: Certified Nurse Midwife

## 2012-10-13 ENCOUNTER — Other Ambulatory Visit: Payer: Self-pay | Admitting: Family Medicine

## 2012-10-17 ENCOUNTER — Ambulatory Visit (INDEPENDENT_AMBULATORY_CARE_PROVIDER_SITE_OTHER): Payer: BC Managed Care – PPO | Admitting: Certified Nurse Midwife

## 2012-10-17 ENCOUNTER — Encounter: Payer: Self-pay | Admitting: Certified Nurse Midwife

## 2012-10-17 VITALS — BP 106/62 | Ht 66.0 in | Wt 210.0 lb

## 2012-10-17 DIAGNOSIS — Z01419 Encounter for gynecological examination (general) (routine) without abnormal findings: Secondary | ICD-10-CM

## 2012-10-17 DIAGNOSIS — Z0000 Encounter for general adult medical examination without abnormal findings: Secondary | ICD-10-CM

## 2012-10-17 LAB — POCT URINALYSIS DIPSTICK
Blood, UA: NEGATIVE
Glucose, UA: NEGATIVE
Ketones, UA: NEGATIVE
Protein, UA: NEGATIVE
Urobilinogen, UA: NEGATIVE

## 2012-10-17 NOTE — Progress Notes (Signed)
37 y.o. Single  Caucasian female   [id number] here for annual exam.  Periods scant to spotting no issues. Happy with Mirena IUD. Medications stable with Bipolar. Thyroid medication stable. No health issues today.  Patient's last menstrual period was 10/10/2012.          Sexually active: yes  The current method of family planning is IUD.    Exercising: no  exercise Last mammogram: none Last pap: 10/04/11 Last BMD: none Alcohol: none Colonoscopy: 2010 Self breast exams: not done Poct urine: neg Hgb: not done   Health Maintenance  Topic Date Due  . Influenza Vaccine  02/19/2013  . Tetanus/tdap  03/06/2014  . Pap Smear  08/20/2014    Family History  Problem Relation Age of Onset  . Cancer Mother 65    ovarian  . Hypothyroidism Mother   . Hypertension Father   . Diabetes Father   . Hyperlipidemia Father   . Heart disease Father     cabg--stint  . Migraines Father   . Hypothyroidism Sister   . Depression Sister   . Cancer Maternal Aunt     breast   . Cancer Maternal Uncle     throat  . COPD Maternal Grandmother   . Cancer Maternal Grandmother     lung  . Hypertension Maternal Grandmother   . Depression Maternal Grandmother   . Stroke Maternal Grandmother   . Cancer Maternal Grandfather     prostate, lung  . Diabetes Paternal Grandmother   . Stroke Paternal Grandmother   . Alcohol abuse Paternal Grandfather   . Aneurysm Paternal Grandfather   . Depression Cousin     bipolar    Patient Active Problem List   Diagnosis Date Noted  . RECTAL BLEEDING 09/10/2008  . KNEE PAIN, LEFT 04/09/2008  . BACK PAIN 04/09/2008  . HYPOTHYROIDISM 02/17/2007  . HIRSUTISM 02/01/2007  . ACNE NEC 02/01/2007  . Bipolar II disorder 12/20/2006  . DERMATITIS, OTHER ATOPIC 12/20/2006    Past Medical History  Diagnosis Date  . Depression   . Thyroid disease   . Bipolar 1 disorder   . Migraines     with aura    Past Surgical History  Procedure Laterality Date  . Wisdom tooth  extraction    . Nasal septum surgery    . Intrauterine device (iud) insertion  5/13    inserted  . Other surgical history      endometrial polyps (2)    Allergies: Review of patient's allergies indicates no known allergies.  Current Outpatient Prescriptions  Medication Sig Dispense Refill  . IBUPROFEN PO Take by mouth as needed.      . lamoTRIgine (LAMICTAL) 100 MG tablet Take 100 mg by mouth daily. Take 1/2 in the a.m. & a whole tablet at h.s      . levonorgestrel (MIRENA) 20 MCG/24HR IUD 1 Intra Uterine Device (1 each total) by Intrauterine route once.  1 each  0  . levothyroxine (SYNTHROID, LEVOTHROID) 125 MCG tablet TAKE 1 TABLET (125 MCG TOTAL) BY MOUTH DAILY.--LABS ARE DUE NOW  7 tablet  0  . Sertraline HCl (ZOLOFT PO) Take by mouth. Take daily       No current facility-administered medications for this visit.    ROS: A comprehensive review of systems was negative.  Exam:    BP 106/62  Ht 5\' 6"  (1.676 m)  Wt 210 lb (95.255 kg)  BMI 33.91 kg/m2  LMP 10/10/2012 Weight change: @WEIGHTCHANGE @ Last 3 height recordings:  Ht Readings from Last 3 Encounters:  10/17/12 5\' 6"  (1.676 m)  03/06/12 5' 6.5" (1.689 m)  09/10/08 5' 5.5" (1.664 m)   General appearance: alert and cooperative Head: Normocephalic, without obvious abnormality, atraumatic Neck: no adenopathy, supple, symmetrical, trachea midline and thyroid not enlarged, symmetric, no tenderness/mass/nodules Lungs: clear to auscultation bilaterally Breasts: normal appearance, no masses or tenderness, No nipple retraction or dimpling Heart: regularly irregular rhythm Abdomen: soft, non-tender; bowel sounds normal; no masses,  no organomegaly Extremities: extremities normal, atraumatic, no cyanosis or edema Skin: Skin color, texture, turgor normal. No rashes or lesions Small irregular raised mole noted on left forehead, brown in color. Lymph nodes: Cervical, supraclavicular, and axillary nodes normal. no inguinal  nodes palpated Neurologic: Grossly normal   Pelvic: External genitalia:  no lesions              Urethra: normal appearing urethra with no masses, tenderness or lesions              Bartholins and Skenes: normal, Bartholin's, Urethra, Skene's normal                 Vagina: normal appearing vagina with normal color and discharge, no lesions              Cervix: normal appearance              Pap taken: no        Bimanual Exam:  Uterus:  uterus is normal size, shape, consistency and nontender, anteverted                                      Adnexa:    normal adnexa in size, nontender and no masses                                      Rectovaginal: Confirms                                      Anus:  normal sphincter tone, no lesions    A: Normal well woman exam Contraception: Mirena IUD Hypothyroid stable medication Bipolar stable medication Irregular shaped mole on left forehead Family History of Ovarian cancer( mother)   P: Reviewed health and wellness pertinent to exam   Aware of warning signs and symptoms with IUD Removal due 5/18 Continue follow up with MD as indicated Discussed irregular appearance of mole on face and instructed to make appointment with dermatology. Patient agrees to make appoint. Has information to schedule Offered PUS and CA 125 as limited screening for ovarian cancer.  Patient also given information on genetic screening resources.  Patient undecide if she wants PUS or CA 125 will advise if desires.  Discussed limitations of screening.  Pap yearly or as indicated by guidelines or exam  Rv annually, prn            An After Visit Summary was printed and given to the patient.  Reviewed, TL

## 2012-10-17 NOTE — Patient Instructions (Signed)

## 2012-10-19 ENCOUNTER — Other Ambulatory Visit (INDEPENDENT_AMBULATORY_CARE_PROVIDER_SITE_OTHER): Payer: BC Managed Care – PPO

## 2012-10-19 DIAGNOSIS — E785 Hyperlipidemia, unspecified: Secondary | ICD-10-CM

## 2012-10-19 DIAGNOSIS — R7989 Other specified abnormal findings of blood chemistry: Secondary | ICD-10-CM

## 2012-10-19 DIAGNOSIS — E039 Hypothyroidism, unspecified: Secondary | ICD-10-CM

## 2012-10-19 LAB — HEPATIC FUNCTION PANEL
ALT: 24 U/L (ref 0–35)
AST: 18 U/L (ref 0–37)
Albumin: 4.1 g/dL (ref 3.5–5.2)
Alkaline Phosphatase: 70 U/L (ref 39–117)
Bilirubin, Direct: 0 mg/dL (ref 0.0–0.3)
Total Bilirubin: 0.6 mg/dL (ref 0.3–1.2)
Total Protein: 7.4 g/dL (ref 6.0–8.3)

## 2012-10-19 LAB — BASIC METABOLIC PANEL
BUN: 12 mg/dL (ref 6–23)
CO2: 28 mEq/L (ref 19–32)
Calcium: 9 mg/dL (ref 8.4–10.5)
Chloride: 99 mEq/L (ref 96–112)
Creatinine, Ser: 0.8 mg/dL (ref 0.4–1.2)
GFR: 92.48 mL/min (ref >60.00)
Glucose, Bld: 121 mg/dL — ABNORMAL HIGH (ref 70–99)
Potassium: 3.4 mEq/L — ABNORMAL LOW (ref 3.5–5.1)
Sodium: 133 mEq/L — ABNORMAL LOW (ref 135–145)

## 2012-10-19 LAB — LIPID PANEL
Cholesterol: 183 mg/dL (ref 0–200)
HDL: 32.6 mg/dL — ABNORMAL LOW (ref >39.00)
Total CHOL/HDL Ratio: 6
Triglycerides: 285 mg/dL — ABNORMAL HIGH (ref 0.0–149.0)
VLDL: 57 mg/dL — ABNORMAL HIGH (ref 0.0–40.0)

## 2012-10-19 LAB — LDL CHOLESTEROL, DIRECT: Direct LDL: 122.1 mg/dL

## 2012-10-19 LAB — TSH: TSH: 5.54 u[IU]/mL — ABNORMAL HIGH (ref 0.35–5.50)

## 2012-10-19 LAB — HEMOGLOBIN A1C: Hgb A1c MFr Bld: 6.4 % (ref 4.6–6.5)

## 2012-10-26 ENCOUNTER — Other Ambulatory Visit: Payer: Self-pay

## 2012-10-26 MED ORDER — LEVOTHYROXINE SODIUM 137 MCG PO TABS: 137.0000 ug | ORAL_TABLET | Freq: Every day | ORAL | Status: DC

## 2012-10-27 ENCOUNTER — Encounter: Payer: Self-pay | Admitting: Family Medicine

## 2012-10-27 ENCOUNTER — Ambulatory Visit (INDEPENDENT_AMBULATORY_CARE_PROVIDER_SITE_OTHER): Payer: BC Managed Care – PPO | Admitting: Family Medicine

## 2012-10-27 VITALS — BP 116/72 | HR 73 | Temp 98.5°F | Wt 214.2 lb

## 2012-10-27 DIAGNOSIS — E118 Type 2 diabetes mellitus with unspecified complications: Secondary | ICD-10-CM

## 2012-10-27 DIAGNOSIS — E1165 Type 2 diabetes mellitus with hyperglycemia: Secondary | ICD-10-CM

## 2012-10-27 NOTE — Patient Instructions (Addendum)
Diabetes Meal Planning Guide The diabetes meal planning guide is a tool to help you plan your meals and snacks. It is important for people with diabetes to manage their blood glucose (sugar) levels. Choosing the right foods and the right amounts throughout your day will help control your blood glucose. Eating right can even help you improve your blood pressure and reach or maintain a healthy weight. CARBOHYDRATE COUNTING MADE EASY When you eat carbohydrates, they turn to sugar. This raises your blood glucose level. Counting carbohydrates can help you control this level so you feel better. When you plan your meals by counting carbohydrates, you can have more flexibility in what you eat and balance your medicine with your food intake. Carbohydrate counting simply means adding up the total amount of carbohydrate grams in your meals and snacks. Try to eat about the same amount at each meal. Foods with carbohydrates are listed below. Each portion below is 1 carbohydrate serving or 15 grams of carbohydrates. Ask your dietician how many grams of carbohydrates you should eat at each meal or snack. Grains and Starches  1 slice bread.   English muffin or hotdog/hamburger bun.   cup cold cereal (unsweetened).   cup cooked pasta or rice.   cup starchy vegetables (corn, potatoes, peas, beans, winter squash).  1 tortilla (6 inches).   bagel.  1 waffle or pancake (size of a CD).   cup cooked cereal.  4 to 6 small crackers. *Whole grain is recommended. Fruit  1 cup fresh unsweetened berries, melon, papaya, pineapple.  1 small fresh fruit.   banana or mango.   cup fruit juice (4 oz unsweetened).   cup canned fruit in natural juice or water.  2 tbs dried fruit.  12 to 15 grapes or cherries. Milk and Yogurt  1 cup fat-free or 1% milk.  1 cup soy milk.  6 oz light yogurt with sugar-free sweetener.  6 oz low-fat soy yogurt.  6 oz plain yogurt. Vegetables  1 cup raw or  cup  cooked is counted as 0 carbohydrates or a "free" food.  If you eat 3 or more servings at 1 meal, count them as 1 carbohydrate serving. Other Carbohydrates   oz chips or pretzels.   cup ice cream or frozen yogurt.   cup sherbet or sorbet.  2 inch square cake, no frosting.  1 tbs honey, sugar, jam, jelly, or syrup.  2 small cookies.  3 squares of graham crackers.  3 cups popcorn.  6 crackers.  1 cup broth-based soup.  Count 1 cup casserole or other mixed foods as 2 carbohydrate servings.  Foods with less than 20 calories in a serving may be counted as 0 carbohydrates or a "free" food. You may want to purchase a book or computer software that lists the carbohydrate gram counts of different foods. In addition, the nutrition facts panel on the labels of the foods you eat are a good source of this information. The label will tell you how big the serving size is and the total number of carbohydrate grams you will be eating per serving. Divide this number by 15 to obtain the number of carbohydrate servings in a portion. Remember, 1 carbohydrate serving equals 15 grams of carbohydrate. SERVING SIZES Measuring foods and serving sizes helps you make sure you are getting the right amount of food. The list below tells how big or small some common serving sizes are.  1 oz.........4 stacked dice.  3 oz.........Deck of cards.  1 tsp........Tip   of little finger.  1 tbs......Marland KitchenMarland KitchenThumb.  2 tbs.......Marland KitchenGolf ball.   cup......Marland KitchenHalf of a fist.  1 cup.......Marland KitchenA fist. SAMPLE DIABETES MEAL PLAN Below is a sample meal plan that includes foods from the grain and starches, dairy, vegetable, fruit, and meat groups. A dietician can individualize a meal plan to fit your calorie needs and tell you the number of servings needed from each food group. However, controlling the total amount of carbohydrates in your meal or snack is more important than making sure you include all of the food groups at every  meal. You may interchange carbohydrate containing foods (dairy, starches, and fruits). The meal plan below is an example of a 2000 calorie diet using carbohydrate counting. This meal plan has 17 carbohydrate servings. Breakfast  1 cup oatmeal (2 carb servings).   cup light yogurt (1 carb serving).  1 cup blueberries (1 carb serving).   cup almonds. Snack  1 large apple (2 carb servings).  1 low-fat string cheese stick. Lunch  Chicken breast salad.  1 cup spinach.   cup chopped tomatoes.  2 oz chicken breast, sliced.  2 tbs low-fat [place] dressing.  12 whole-wheat crackers (2 carb servings).  12 to 15 grapes (1 carb serving).  1 cup low-fat milk (1 carb serving). Snack  1 cup carrots.   cup hummus (1 carb serving). Dinner  3 oz broiled salmon.  1 cup brown rice (3 carb servings). Snack  1  cups steamed broccoli (1 carb serving) drizzled with 1 tsp olive oil and lemon juice.  1 cup light pudding (2 carb servings). DIABETES MEAL PLANNING WORKSHEET Your dietician can use this worksheet to help you decide how many servings of foods and what types of foods are right for you.  BREAKFAST Food Group and Servings / Carb Servings Grain/Starches __________________________________ Dairy __________________________________________ Vegetable ______________________________________ Fruit ___________________________________________ Meat __________________________________________ Fat ____________________________________________ LUNCH Food Group and Servings / Carb Servings Grain/Starches ___________________________________ Dairy ___________________________________________ Fruit ____________________________________________ Meat ___________________________________________ Fat _____________________________________________ Laural Golden Food Group and Servings / Carb Servings Grain/Starches ___________________________________ Dairy  ___________________________________________ Fruit ____________________________________________ Meat ___________________________________________ Fat _____________________________________________ SNACKS Food Group and Servings / Carb Servings Grain/Starches ___________________________________ Dairy ___________________________________________ Vegetable _______________________________________ Fruit ____________________________________________ Meat ___________________________________________ Fat _____________________________________________ DAILY TOTALS Starches _________________________ Vegetable ________________________ Fruit ____________________________ Dairy ____________________________ Meat ____________________________ Fat ______________________________ Document Released: 03/04/2005 Document Revised: 08/30/2011 Document Reviewed: 01/13/2009 [employer] Patient Information 2013 [place], [place].  Diabetes and Standards of Medical Care  Diabetes is complicated. You may find that your diabetes team includes a dietitian, nurse, diabetes educator, eye doctor, and more. To help everyone know what is going on and to help you get the care you deserve, the following schedule of care was developed to help keep you on track. Below are the tests, exams, vaccines, medicines, education, and plans you will need. A1c test  Performed at least 2 times a year if you are meeting treatment goals.  Performed 4 times a year if therapy has changed or if you are not meeting therapy/glycemic goals. Aspirin medicine  Take daily as directed by your caregiver. Blood pressure test  Performed at every routine medical visit. The goal is less than 130/80 mm/Hg. Dental exam  Get a dental exam at least 2 times a year. Dilated eye exam (retinal exam)  Type 1 diabetes: Get an exam within 5 years of diagnosis and then yearly.  Type 2 diabetes: Get an exam at diagnosis and then yearly. All exams thereafter can be  extended to every 2 to 3 years if one or  more exams have been normal. Foot care exam  Visual foot exams are performed at every routine medical visit. The exams check for cuts, injuries, or other problems with the feet.  A comprehensive foot exam should be done yearly. This includes visual inspection as well as assessing foot pulses and testing for loss of sensation. Kidney function test (urine microalbumin)  Performed once a year.  Type 1 diabetes: The first test is performed 5 years after diagnosis.  Type 2 diabetes: The first test is performed at the time of diagnosis.  A serum creatinine and estimated glomerular filtration rate (eGFR) test is done once a year to tell the level of chronic kidney disease (CKD), if present. Lipid profile (Cholesterol, HDL, LDL, Triglycerides)  Performed once a year for most people. If at low risk, may be assessed every 2 years.  The goal for LDL is less than 100 mg/dl. If at high risk, the goal is less than 70 mg/dl.  The goal for HDL is higher than 40 mg/dl for men and higher than 50 mg/dl for women.  The goal for triglycerides is less than 150 mg/dl. Flu vaccine, pneumonia vaccine, and hepatitis B vaccine  The flu vaccine is recommended yearly.  The pneumonia vaccine is generally given once in a lifetime. However, there are some instances where another vaccine is recommended. Check with your caregiver.  The hepatitis B vaccine is also recommended for adults with diabetes. Diabetes self-management education  Recommended at diagnosis and ongoing as needed. Treatment plan  Reviewed at every medical visit. Document Released: 04/04/2009 Document Revised: 08/30/2011 Document Reviewed: 12/08/2010 [facility] Patient Information 2013 [place], [place].

## 2012-10-29 ENCOUNTER — Encounter: Payer: Self-pay | Admitting: Family Medicine

## 2012-10-29 NOTE — Progress Notes (Signed)
Subjective:     Cheryl [name] is a 37 y.o. female who presents borderline diabetes II. Current symptoms include: none. Patient denies foot ulcerations, hyperglycemia, hypoglycemia , increased appetite, nausea, paresthesia of the feet, polydipsia, polyuria, visual disturbances, vomiting and weight loss. Evaluation to date has been: fasting blood sugar, fasting lipid panel, hemoglobin A1C and microalbuminuria. Home sugars: patient does not check sugars. Current treatments: none. Last dilated eye exam due.  The following portions of the patient's history were reviewed and updated as appropriate: allergies, current medications, past family history, past medical history, past social history, past surgical history and problem list.  Review of Systems Pertinent items are noted in HPI.    Past Medical History  Diagnosis Date  . Depression   . Thyroid disease   . Bipolar 1 disorder   . Migraines     with aura   History   Social History  . Marital Status: Single    Spouse Name: N/A    Number of Children: N/A  . Years of Education: N/A   Occupational History  . [employer]    Social History Main Topics  . Smoking status: Current Every Day Smoker -- 1.00 packs/day    Types: Cigarettes  . Smokeless tobacco: Never Used     Comment: weaning down  . Alcohol Use: No  . Drug Use: No  . Sexually Active: Yes -- Female partner(s)    Birth Control/ Protection: IUD   Other Topics Concern  . Not on file   Social History Narrative   Exercise-- no   Current Outpatient Prescriptions on File Prior to Visit  Medication Sig Dispense Refill  . IBUPROFEN PO Take by mouth as needed.      . lamoTRIgine (LAMICTAL) 100 MG tablet Take 100 mg by mouth daily. Take 1/2 in the a.m. & a whole tablet at h.s      . levonorgestrel (MIRENA) 20 MCG/24HR IUD 1 Intra Uterine Device (1 each total) by Intrauterine route once.  1 each  0  . levothyroxine (SYNTHROID, LEVOTHROID) 137 MCG tablet Take 1  tablet (137 mcg total) by mouth daily before breakfast.  30 tablet  2  . Sertraline HCl (ZOLOFT PO) Take by mouth. Take daily       No current facility-administered medications on file prior to visit.   Family History  Problem Relation Age of Onset  . Cancer Mother 18    ovarian  . Hypothyroidism Mother   . Hypertension Father   . Diabetes Father   . Hyperlipidemia Father   . Heart disease Father     cabg--stint  . Migraines Father   . Hypothyroidism Sister   . Depression Sister   . Cancer Maternal Aunt     breast   . Cancer Maternal Uncle     throat  . COPD Maternal Grandmother   . Cancer Maternal Grandmother     lung  . Hypertension Maternal Grandmother   . Depression Maternal Grandmother   . Stroke Maternal Grandmother   . Cancer Maternal Grandfather     prostate, lung  . Diabetes Paternal Grandmother   . Stroke Paternal Grandmother   . Alcohol abuse Paternal Grandfather   . Aneurysm Paternal Grandfather   . Depression Cousin     bipolar    Objective:due    BP 116/72  Pulse 73  Temp(Src) 98.5 F (36.9 C) (Oral)  Wt 214 lb 3.2 oz (97.16 kg)  BMI 34.59 kg/m2  SpO2 96%  LMP  10/10/2012 General appearance: alert, cooperative, appears stated age and no distress Ears: normal TM's and external ear canals both ears Nose: Nares normal. Septum midline. Mucosa normal. No drainage or sinus tenderness. Throat: lips, mucosa, and tongue normal; teeth and gums normal Neck: no adenopathy, supple, symmetrical, trachea midline and thyroid not enlarged, symmetric, no tenderness/mass/nodules Lungs: clear to auscultation bilaterally Heart: S1, S2 normal Extremities: extremities normal, atraumatic, no cyanosis or edema  Patient was not evaluated for proper footwear and sizing. Sensory exam of the foot is normal, tested with the monofilament. Good pulses, no lesions or ulcers, good peripheral pulses.  Laboratory: Lab Results  Component Value Date   HGBA1C 6.4 10/19/2012    Lab Results  Component Value Date   CHOL 183 10/19/2012   CHOL 145 06/19/2012   CHOL 184 03/14/2012   Lab Results  Component Value Date   HDL 32.60* 10/19/2012   HDL 10.96* 06/19/2012   HDL 33.40* 03/14/2012   Lab Results  Component Value Date   LDLCALC 82 06/19/2012   LDLCALC 96 05/03/2006   Lab Results  Component Value Date   TRIG 285.0* 10/19/2012   TRIG 168.0* 06/19/2012   TRIG 318.0* 03/14/2012   Lab Results  Component Value Date   CHOLHDL 6 10/19/2012   CHOLHDL 5 06/19/2012   CHOLHDL 6 03/14/2012   Lab Results  Component Value Date   LDLDIRECT 122.1 10/19/2012   LDLDIRECT 111.1 03/14/2012      Assessment:    hyperglycemia    Plan:    Discussed general issues about diabetes pathophysiology and management. Counseling at today's visit: discussed the need for weight loss, focused on the need for regular aerobic exercise, discussed the advantages of a diet low in carbohydrates, reminded to check sugars regularly and to bring readings in at the time of the next visit, discussed management of hypoglycemic episodes and no diabetic meds at this time. Agricultural engineer distributed. Addressed ADA diet. Encouraged aerobic exercise. Discussed foot care. Reminded to get yearly retinal exam. Reminded to bring in blood sugar diary at next visit. Follow up in 3 months or as needed. ---drop off or send in BS readings in 2 weeks

## 2012-10-31 ENCOUNTER — Telehealth: Payer: Self-pay | Admitting: General Practice

## 2012-10-31 ENCOUNTER — Other Ambulatory Visit: Payer: Self-pay | Admitting: General Practice

## 2012-10-31 DIAGNOSIS — E119 Type 2 diabetes mellitus without complications: Secondary | ICD-10-CM

## 2012-10-31 MED ORDER — GLUCOSE BLOOD VI STRP: ORAL_STRIP | Status: DC

## 2012-10-31 NOTE — Addendum Note (Signed)
Addended by: Candie Echevaria L on: 10/31/2012 05:20 PM   Modules accepted: Orders

## 2012-10-31 NOTE — Telephone Encounter (Signed)
Rx resent for test strips

## 2012-10-31 NOTE — Telephone Encounter (Signed)
Pt called stating she needs a refill of her test strips sent to [facility] on [address]. These were sent today.

## 2012-11-01 ENCOUNTER — Telehealth: Payer: Self-pay | Admitting: *Deleted

## 2012-11-01 MED ORDER — GLUCOSE BLOOD VI STRP: ORAL_STRIP | Status: DC

## 2012-11-01 NOTE — Telephone Encounter (Signed)
Per insurance freestyle Insulinx is not a preferred meter. Pt notified and meter and strip changed to onetouch, Rx sent

## 2012-11-07 ENCOUNTER — Encounter: Payer: BC Managed Care – PPO | Attending: Family Medicine | Admitting: *Deleted

## 2012-11-07 DIAGNOSIS — E119 Type 2 diabetes mellitus without complications: Secondary | ICD-10-CM | POA: Insufficient documentation

## 2012-11-07 DIAGNOSIS — Z713 Dietary counseling and surveillance: Secondary | ICD-10-CM | POA: Insufficient documentation

## 2012-11-09 ENCOUNTER — Encounter: Payer: Self-pay | Admitting: *Deleted

## 2012-11-09 NOTE — Progress Notes (Signed)
Patient was seen on 11/07/2012 for the first of a series of three diabetes self-management courses at [facility]. Patient's most recent A1c was 6.4 % on 10/19/2012 The following learning objectives were met by the patient during this course:   Defines the role of glucose and insulin  Identifies type of diabetes and pathophysiology  Defines the diagnostic criteria for diabetes and prediabetes  States the risk factors for Type 2 Diabetes  States the symptoms of Type 2 Diabetes  Defines Type 2 Diabetes treatment goals  Defines Type 2 Diabetes treatment options  States the rationale for glucose monitoring  Identifies A1C, glucose targets, and testing times  Identifies proper sharps disposal  Defines the purpose of a diabetes food plan  Identifies carbohydrate food groups  Defines effects of carbohydrate foods on glucose levels  Identifies carbohydrate choices/grams/food labels  States benefits of physical activity and effect on glucose  Review of suggested activity guidelines  Handouts given during class include:  Type 2 Diabetes: Basics Book  My Food Plan Book  Food and Activity Log   Follow-Up Plan: Core Class 2

## 2012-11-09 NOTE — Patient Instructions (Signed)
Goals:  Follow Diabetes Meal Plan as instructed  Eat 3 meals and 2 snacks, every 3-5 hrs  Limit carbohydrate intake to 30-45 grams carbohydrate/meal  Limit carbohydrate intake to 0-15 grams carbohydrate/snack  Add lean protein foods to meals/snacks  Monitor glucose levels as instructed by your doctor  Aim for 15-30 mins of physical activity daily  Bring food record and glucose log to your next nutrition visit   

## 2013-01-23 ENCOUNTER — Ambulatory Visit: Payer: BC Managed Care – PPO

## 2013-01-30 ENCOUNTER — Encounter: Payer: Self-pay | Admitting: Family Medicine

## 2013-01-30 ENCOUNTER — Ambulatory Visit (INDEPENDENT_AMBULATORY_CARE_PROVIDER_SITE_OTHER): Payer: 59 | Admitting: Family Medicine

## 2013-01-30 VITALS — BP 118/74 | HR 84 | Temp 98.7°F | Wt 203.0 lb

## 2013-01-30 DIAGNOSIS — E119 Type 2 diabetes mellitus without complications: Secondary | ICD-10-CM

## 2013-01-30 DIAGNOSIS — E039 Hypothyroidism, unspecified: Secondary | ICD-10-CM

## 2013-01-30 DIAGNOSIS — E1151 Type 2 diabetes mellitus with diabetic peripheral angiopathy without gangrene: Secondary | ICD-10-CM | POA: Insufficient documentation

## 2013-01-30 DIAGNOSIS — J029 Acute pharyngitis, unspecified: Secondary | ICD-10-CM

## 2013-01-30 DIAGNOSIS — IMO0001 Reserved for inherently not codable concepts without codable children: Secondary | ICD-10-CM

## 2013-01-30 DIAGNOSIS — E1165 Type 2 diabetes mellitus with hyperglycemia: Secondary | ICD-10-CM | POA: Insufficient documentation

## 2013-01-30 LAB — HEMOGLOBIN A1C: Hgb A1c MFr Bld: 6.1 % (ref 4.6–6.5)

## 2013-01-30 LAB — BASIC METABOLIC PANEL
BUN: 12 mg/dL (ref 6–23)
Chloride: 104 mEq/L (ref 96–112)
Potassium: 4 mEq/L (ref 3.5–5.1)

## 2013-01-30 LAB — LIPID PANEL
Total CHOL/HDL Ratio: 5
VLDL: 46 mg/dL — ABNORMAL HIGH (ref 0.0–40.0)

## 2013-01-30 LAB — HEPATIC FUNCTION PANEL
AST: 14 U/L (ref 0–37)
Albumin: 4 g/dL (ref 3.5–5.2)
Alkaline Phosphatase: 67 U/L (ref 39–117)

## 2013-01-30 NOTE — Assessment & Plan Note (Signed)
Check labs con't meds 

## 2013-01-30 NOTE — Progress Notes (Signed)
  Subjective:    Patient ID: Cheryl [name], female    DOB: [birth year], 37 y.o.   MRN: [id number]  HPI Pt here to f/u diabetes and thyroid.  No complaints except pt having difficulty with diet.  No other complaints.   Review of Systems As above    Objective:   Physical Exam BP 118/74  Pulse 84  Temp(Src) 98.7 F (37.1 C) (Oral)  Wt 203 lb (92.08 kg)  BMI 32.78 kg/m2  SpO2 96% General appearance: alert, cooperative, appears stated age and no distress Ears: normal TM's and external ear canals both ears Nose: Nares normal. Septum midline. Mucosa normal. No drainage or sinus tenderness. Throat: lips, mucosa, and tongue normal; teeth and gums normal Neck: no adenopathy, supple, symmetrical, trachea midline and thyroid not enlarged, symmetric, no tenderness/mass/nodules Lungs: clear to auscultation bilaterally Heart: S1, S2 normal Extremities: extremities normal, atraumatic, no cyanosis or edema        Assessment & Plan:

## 2013-01-30 NOTE — Patient Instructions (Signed)

## 2013-02-05 LAB — MICROALBUMIN / CREATININE URINE RATIO: Microalb, Ur: 1.7 mg/dL (ref 0.0–1.9)

## 2013-02-05 NOTE — Addendum Note (Signed)
Addended by: Silvio Pate D on: 02/05/2013 01:45 PM   Modules accepted: Orders

## 2013-02-06 ENCOUNTER — Ambulatory Visit: Payer: BC Managed Care – PPO

## 2013-02-08 ENCOUNTER — Telehealth: Payer: Self-pay

## 2013-02-08 DIAGNOSIS — E785 Hyperlipidemia, unspecified: Secondary | ICD-10-CM

## 2013-02-08 MED ORDER — LEVOTHYROXINE SODIUM 137 MCG PO TABS: 137.0000 ug | ORAL_TABLET | Freq: Every day | ORAL | Status: DC

## 2013-02-08 MED ORDER — FENOFIBRATE 160 MG PO TABS: 160.0000 mg | ORAL_TABLET | Freq: Every day | ORAL | Status: DC

## 2013-02-08 NOTE — Telephone Encounter (Signed)
Message copied by Maurice Small on Thu Feb 08, 2013  2:54 PM ------      Message from: Lelon Perla      Created: Mon Feb 05, 2013 10:12 PM       Cholesterol--- LDL goal < 100,  HDL >40,  TG < 150.  Diet and exercise will increase HDL and decrease LDL and TG.  Fish,  Fish Oil, Flaxseed oil will also help increase the HDL and decrease Triglycerides.   Recheck labs in 3 months-    Fenofibrate 160mg  #30  1 po qd , 2 refills        272.4  Lipid, hep.             ------

## 2013-02-08 NOTE — Telephone Encounter (Signed)
Discussed all results with patient, patient verbalized understanding. Patient indicated depending on the price of Fenofibrate she may not be able to pick-up until 1.5 month(s) from now due to no insurance. Patient will call to schedule labs 3 months after her first start date of new medication. Patient also requested a refill on thyroid medication at this time. Patient would like rx's sent to an alternate pharmacy [facility])

## 2013-09-14 ENCOUNTER — Telehealth: Payer: Self-pay | Admitting: *Deleted

## 2013-09-14 MED ORDER — LEVOTHYROXINE SODIUM 137 MCG PO TABS: 137.0000 ug | ORAL_TABLET | Freq: Every day | ORAL | Status: DC

## 2013-09-14 NOTE — Telephone Encounter (Signed)
Rx sent      KP 

## 2013-09-14 NOTE — Telephone Encounter (Signed)
Pt called requesting refill on levothyroxine (SYNTHROID, LEVOTHROID) 137 MCG tablet.   She is completely out.  Contacted pharmacy a couple weeks ago to get refilled and they told pt pharmacy had to contact us for renewal of this.  Pt called [place] today because she has not heard anything.  She is using new pharmacy now, please send to [facility] in [place] on [address].  Last OV 01/30/13 Last refill 02/08/13, #30, 5 refills

## 2013-09-28 ENCOUNTER — Ambulatory Visit (INDEPENDENT_AMBULATORY_CARE_PROVIDER_SITE_OTHER): Payer: Managed Care, Other (non HMO) | Admitting: Internal Medicine

## 2013-09-28 ENCOUNTER — Encounter: Payer: Self-pay | Admitting: Internal Medicine

## 2013-09-28 ENCOUNTER — Telehealth: Payer: Self-pay | Admitting: Internal Medicine

## 2013-09-28 VITALS — BP 116/80 | HR 82 | Temp 98.3°F | Ht 66.0 in | Wt 208.2 lb

## 2013-09-28 DIAGNOSIS — F32A Depression, unspecified: Secondary | ICD-10-CM

## 2013-09-28 DIAGNOSIS — F3289 Other specified depressive episodes: Secondary | ICD-10-CM

## 2013-09-28 DIAGNOSIS — R0609 Other forms of dyspnea: Secondary | ICD-10-CM

## 2013-09-28 DIAGNOSIS — R5381 Other malaise: Secondary | ICD-10-CM

## 2013-09-28 DIAGNOSIS — F329 Major depressive disorder, single episode, unspecified: Secondary | ICD-10-CM

## 2013-09-28 DIAGNOSIS — E119 Type 2 diabetes mellitus without complications: Secondary | ICD-10-CM

## 2013-09-28 DIAGNOSIS — R5383 Other fatigue: Secondary | ICD-10-CM

## 2013-09-28 DIAGNOSIS — R0989 Other specified symptoms and signs involving the circulatory and respiratory systems: Secondary | ICD-10-CM

## 2013-09-28 DIAGNOSIS — E039 Hypothyroidism, unspecified: Secondary | ICD-10-CM

## 2013-09-28 DIAGNOSIS — R0683 Snoring: Secondary | ICD-10-CM

## 2013-09-28 LAB — COMPREHENSIVE METABOLIC PANEL
ALBUMIN: 4.1 g/dL (ref 3.5–5.2)
ALK PHOS: 63 U/L (ref 39–117)
ALT: 28 U/L (ref 0–35)
AST: 29 U/L (ref 0–37)
BUN: 15 mg/dL (ref 6–23)
CO2: 31 mEq/L (ref 19–32)
Calcium: 9.2 mg/dL (ref 8.4–10.5)
Chloride: 100 mEq/L (ref 96–112)
Creatinine, Ser: 1 mg/dL (ref 0.4–1.2)
GFR: 64.52 mL/min (ref >60.00)
Glucose, Bld: 90 mg/dL (ref 70–99)
POTASSIUM: 4 meq/L (ref 3.5–5.1)
Sodium: 136 mEq/L (ref 135–145)
Total Bilirubin: 0.7 mg/dL (ref 0.3–1.2)
Total Protein: 7.4 g/dL (ref 6.0–8.3)

## 2013-09-28 LAB — LIPID PANEL
CHOLESTEROL: 301 mg/dL — AB (ref 0–200)
HDL: 43.8 mg/dL (ref >39.00)
LDL Cholesterol: 206 mg/dL — ABNORMAL HIGH (ref 0–99)
Total CHOL/HDL Ratio: 7
Triglycerides: 258 mg/dL — ABNORMAL HIGH (ref 0.0–149.0)
VLDL: 51.6 mg/dL — AB (ref 0.0–40.0)

## 2013-09-28 LAB — HEMOGLOBIN A1C: Hgb A1c MFr Bld: 6.2 % (ref 4.6–6.5)

## 2013-09-28 LAB — CBC
HEMATOCRIT: 40.1 % (ref 36.0–46.0)
Hemoglobin: 13.6 g/dL (ref 12.0–15.0)
MCHC: 34 g/dL (ref 30.0–36.0)
MCV: 91.1 fl (ref 78.0–100.0)
PLATELETS: 257 10*3/uL (ref 150.0–400.0)
RBC: 4.4 Mil/uL (ref 3.87–5.11)
RDW: 13.1 % (ref 11.5–14.6)
WBC: 8.3 10*3/uL (ref 4.5–10.5)

## 2013-09-28 LAB — TSH: TSH: 78.96 u[IU]/mL — ABNORMAL HIGH (ref 0.35–5.50)

## 2013-09-28 MED ORDER — LEVOTHYROXINE SODIUM 137 MCG PO TABS: 137.0000 ug | ORAL_TABLET | Freq: Every day | ORAL | Status: DC

## 2013-09-28 NOTE — Assessment & Plan Note (Signed)
Not medicated Will recheck A1C today Encouraged her to work on diet and exercise

## 2013-09-28 NOTE — Patient Instructions (Addendum)

## 2013-09-28 NOTE — Assessment & Plan Note (Signed)
She has been out of her medication  Will recheck TSH today and refill synthroid

## 2013-09-28 NOTE — Progress Notes (Signed)
Pre visit review using our clinic review tool, if applicable. No additional management support is needed unless otherwise documented below in the visit note. 

## 2013-09-28 NOTE — Progress Notes (Signed)
HPI Pt presents to the clinic today to establish care. Cheryl [name] is transferring care from Dr. Etter Sjogren. Cheryl [name] does have some concerns today.   Sleep apnea: Cheryl [name] reports that Cheryl [name] is tired most of the day. Cheryl [name] reports that Cheryl [name] snores, wakes up gasping and feels tired most of the day. Cheryl [name] feels like this is due to her weight. Cheryl [name] also thinks it may be due to her smoking. Cheryl [name] reports that Cheryl [name] did have a sleep study a few years ago. Cheryl [name] did not have sleep apnea during that time.   Past Medical History  Diagnosis Date  . Depression   . Thyroid disease   . Bipolar 1 disorder   . Migraines     with aura  . Diabetes mellitus without complication     Current Outpatient Prescriptions  Medication Sig Dispense Refill  . IBUPROFEN PO Take by mouth as needed.      . lamoTRIgine (LAMICTAL) 100 MG tablet Take 100 mg by mouth daily. Take 1 tablet in the AM, 1 tablet in the PM      . levonorgestrel (MIRENA) 20 MCG/24HR IUD 1 Intra Uterine Device (1 each total) by Intrauterine route once.  1 each  0  . levothyroxine (SYNTHROID, LEVOTHROID) 137 MCG tablet Take 1 tablet (137 mcg total) by mouth daily before breakfast.  30 tablet  5  . sertraline (ZOLOFT) 100 MG tablet Take 150 mg by mouth daily. 1 1/2 tablet daily       No current facility-administered medications for this visit.    No Known Allergies  Family History  Problem Relation Age of Onset  . Cancer Mother 23    ovarian  . Hypothyroidism Mother   . Hypertension Father   . Diabetes Father   . Hyperlipidemia Father   . Heart disease Father     cabg--stint  . Migraines Father   . Hypothyroidism Sister   . Depression Sister   . Cancer Maternal Aunt     breast   . Cancer Maternal Uncle     throat  . COPD Maternal Grandmother   . Cancer Maternal Grandmother     lung  . Hypertension Maternal Grandmother   . Depression Maternal Grandmother   . Stroke Maternal Grandmother   . Cancer Maternal Grandfather     prostate, lung  . Diabetes Paternal  Grandmother   . Stroke Paternal Grandmother   . Alcohol abuse Paternal Grandfather   . Aneurysm Paternal Grandfather   . Depression Cousin     bipolar    History   Social History  . Marital Status: Single    Spouse Name: N/A    Number of Children: N/A  . Years of Education: N/A   Occupational History  . [employer]    Social History Main Topics  . Smoking status: Current Every Day Smoker -- 0.50 packs/day for 20 years    Types: Cigarettes  . Smokeless tobacco: Never Used     Comment: weaning down--e-cigs  . Alcohol Use: Yes     Comment: occasional  . Drug Use: No  . Sexual Activity: Yes    Partners: Male    Birth Control/ Protection: IUD   Other Topics Concern  . Not on file   Social History Narrative   Exercise-- no    ROS:  Constitutional: Pt reports fatigue. Denies fever, malaise,  headache or abrupt weight changes.  HEENT: Denies eye pain, eye redness, ear pain, ringing in the ears, wax buildup, runny nose, nasal congestion,  bloody nose, or sore throat. Respiratory: Denies difficulty breathing, shortness of breath, cough or sputum production.   Cardiovascular: Denies chest pain, chest tightness, palpitations or swelling in the hands or feet.  Gastrointestinal: Denies abdominal pain, bloating, constipation, diarrhea or blood in the stool.  GU: Denies frequency, urgency, pain with urination, blood in urine, odor or discharge. Musculoskeletal: Denies decrease in range of motion, difficulty with gait, muscle pain or joint pain and swelling.  Skin: Denies redness, rashes, lesions or ulcercations.  Neurological: Denies dizziness, difficulty with memory, difficulty with speech or problems with balance and coordination.   No other specific complaints in a complete review of systems (except as listed in HPI above).  PE:  BP 116/80  Pulse 82  Temp(Src) 98.3 F (36.8 C) (Oral)  Ht 5\' 6"  (1.676 m)  Wt 208 lb 4 oz (94.462 kg)  BMI 33.63 kg/m2  SpO2  97% Wt Readings from Last 3 Encounters:  09/28/13 208 lb 4 oz (94.462 kg)  01/30/13 203 lb (92.08 kg)  11/09/12 208 lb 4.8 oz (94.484 kg)    General: Appears her stated age, overweight but well developed, well nourished in NAD. HEENT: Head: normal shape and size; Eyes: sclera white, no icterus, conjunctiva pink, PERRLA and EOMs intact; Ears: Tm's gray and intact, normal light reflex; Nose: mucosa pink and moist, septum midline; Throat/Mouth: Teeth present, mucosa pink and moist, no lesions or ulcerations noted.  Neck: Normal range of motion. Neck supple, trachea midline. No massses, lumps or thyromegaly present.  Cardiovascular: Normal rate and rhythm. S1,S2 noted.  No murmur, rubs or gallops noted. No JVD or BLE edema. No carotid bruits noted. Pulmonary/Chest: Normal effort and positive vesicular breath sounds. No respiratory distress. No wheezes, rales or ronchi noted.  Abdomen: Soft and nontender. Normal bowel sounds, no bruits noted. No distention or masses noted. Liver, spleen and kidneys non palpable. Musculoskeletal: Normal range of motion. No signs of joint swelling. No difficulty with gait.  Neurological: Alert and oriented. Cranial nerves II-XII intact. Coordination normal. +DTRs bilaterally. Psychiatric: Mood and affect normal. Behavior is normal. Judgment and thought content normal.     BMET    Component Value Date/Time   NA 138 01/30/2013 1230   K 4.0 01/30/2013 1230   CL 104 01/30/2013 1230   CO2 26 01/30/2013 1230   GLUCOSE 72 01/30/2013 1230   GLUCOSE 82 05/03/2006 1035   BUN 12 01/30/2013 1230   CREATININE 0.8 01/30/2013 1230   CALCIUM 9.2 01/30/2013 1230   GFRNONAA 102.57 09/10/2008 1103   GFRAA 108 02/01/2007 0834    Lipid Panel     Component Value Date/Time   CHOL 177 01/30/2013 1230   TRIG 230.0* 01/30/2013 1230   HDL 35.90* 01/30/2013 1230   CHOLHDL 5 01/30/2013 1230   VLDL 46.0* 01/30/2013 1230   LDLCALC 82 06/19/2012 0839    CBC    Component Value Date/Time    WBC 7.9 03/14/2012 1331   RBC 4.17 03/14/2012 1331   HGB 12.9 03/14/2012 1331   HCT 39.4 03/14/2012 1331   PLT 225.0 03/14/2012 1331   MCV 94.5 03/14/2012 1331   MCH 29.2 11/10/2010 0625   MCHC 32.6 03/14/2012 1331   RDW 12.5 03/14/2012 1331   LYMPHSABS 2.8 03/14/2012 1331   MONOABS 0.5 03/14/2012 1331   EOSABS 0.1 03/14/2012 1331   BASOSABS 0.0 03/14/2012 1331    Hgb A1C Lab Results  Component Value Date   HGBA1C 6.1 01/30/2013     Assessment and Plan:  Encouraged her to work on diet and exercise Encouraged her to quit smoking, Cheryl [name] is weaning to e cigs  Feeling tired, snoring:  Will refer to pulm for possible sleep study  RTC in 6 months or sooner if needed

## 2013-09-28 NOTE — Telephone Encounter (Signed)
Relevant patient education mailed to patient.  

## 2013-10-01 ENCOUNTER — Telehealth: Payer: Self-pay | Admitting: Family Medicine

## 2013-10-01 MED ORDER — SIMVASTATIN 10 MG PO TABS: 10.0000 mg | ORAL_TABLET | Freq: Every day | ORAL | Status: DC

## 2013-10-01 NOTE — Telephone Encounter (Signed)
Relevant patient education mailed to patient.  

## 2013-10-01 NOTE — Addendum Note (Signed)
Addended by: Lurlean Nanny on: 10/01/2013 04:52 PM   Modules accepted: Orders

## 2013-10-12 ENCOUNTER — Telehealth: Payer: Self-pay

## 2013-10-12 NOTE — Telephone Encounter (Signed)
Relevant patient education mailed to patient.  

## 2013-10-17 ENCOUNTER — Ambulatory Visit (INDEPENDENT_AMBULATORY_CARE_PROVIDER_SITE_OTHER): Payer: Managed Care, Other (non HMO) | Admitting: Pulmonary Disease

## 2013-10-17 ENCOUNTER — Encounter: Payer: Self-pay | Admitting: Pulmonary Disease

## 2013-10-17 VITALS — BP 136/92 | HR 94 | Ht 66.0 in | Wt 217.0 lb

## 2013-10-17 DIAGNOSIS — G4733 Obstructive sleep apnea (adult) (pediatric): Secondary | ICD-10-CM

## 2013-10-17 HISTORY — DX: Obstructive sleep apnea (adult) (pediatric): G47.33

## 2013-10-17 NOTE — Patient Instructions (Signed)
Will arrange for sleep study Will call to arrange for follow up after sleep study reviewed 

## 2013-10-17 NOTE — Progress Notes (Deleted)
   Subjective:    Patient ID: Cheryl [name], female    DOB: 14-Aug-1975, 38 y.o.   MRN: [id number]  HPI    Review of Systems  Constitutional: Negative for fever and unexpected weight change.  HENT: Negative for congestion, dental problem, ear pain, nosebleeds, postnasal drip, rhinorrhea, sinus pressure, sneezing, sore throat and trouble swallowing.   Eyes: Negative for redness and itching.  Respiratory: Positive for cough and shortness of breath. Negative for chest tightness and wheezing.   Cardiovascular: Negative for palpitations and leg swelling.  Gastrointestinal: Negative for nausea and vomiting.  Genitourinary: Negative for dysuria.  Musculoskeletal: Negative for joint swelling.  Skin: Negative for rash.  Neurological: Negative for headaches.  Hematological: Does not bruise/bleed easily.  Psychiatric/Behavioral: Negative for dysphoric mood. The patient is not nervous/anxious.        Objective:   Physical Exam        Assessment & Plan:

## 2013-10-17 NOTE — Assessment & Plan Note (Signed)
She reports snoring, sleep disruption, witnessed apnea, and daytime sleepiness.  She has history of mood disorder.  I am concerned she could have sleep apnea.  We discussed how sleep apnea can affect various health problems including risks for hypertension, cardiovascular disease, and diabetes.  We also discussed how sleep disruption can increase risks for accident, such as while driving.  Weight loss as a means of improving sleep apnea was also reviewed.  Additional treatment options discussed were CPAP therapy, oral appliance, and surgical intervention.  Will arrange for in lab sleep study to further assess.

## 2013-10-17 NOTE — Progress Notes (Signed)
Chief Complaint  Patient presents with  . Sleep Consult    referred by Webb Silversmith for snoring. Epworth Score: 15.    History of Present Illness: Cheryl [name] is a 38 y.o. female smoker for evaluation of sleep problems.  She has noticed trouble with her sleep for years.  She snores, and wakes up feeling like she is gasping.  She has been told she stops breathing while asleep.  She had a sleep study at [facility] a few years ago >> she didn't sleep during the study.  She has gained 20 lbs since then.  She is tired all the time.  She has history of bipolar disorder, and is followed by Jobie Quaker with [facility].  She takes klonopin sometimes at night to help sleep when she has her manic phase.  She drinks lots of coffee to stay awake, and will use energy shots also.  She gets frequent headaches, and loves to take naps.  She gets sleepy if she has to drive to far.  She goes to sleep at 11 pm.  She falls asleep quickly.  She wakes up one or two times to use the bathroom.  She gets out of bed at 630.  She feels tired in the morning.  She gets morning headache.  She denies sleep walking, sleep talking, bruxism, or nightmares.  There is no history of restless legs.  She denies sleep hallucinations, sleep paralysis, or cataplexy.  The Epworth score is 15 out of 24.  Cheryl [name]  has a past medical history of Depression; Thyroid disease; Bipolar 1 disorder; Migraines; and Diabetes mellitus without complication.  Cheryl [name]  has past surgical history that includes Wisdom tooth extraction; Nasal septum surgery; Intrauterine device (iud) insertion (5/13); and Other surgical history.  Prior to Admission medications   Medication Sig Start Date End Date Taking? Authorizing Provider  IBUPROFEN PO Take by mouth as needed.   Yes Historical Provider, MD  lamoTRIgine (LAMICTAL) 100 MG tablet Take 1 tablet in the AM, 1 tablet in the PM   Yes Historical Provider,  MD  levonorgestrel (MIRENA) 20 MCG/24HR IUD 1 Intra Uterine Device (1 each total) by Intrauterine route once. 03/06/12  Yes Rosalita Chessman, DO  levothyroxine (SYNTHROID, LEVOTHROID) 137 MCG tablet Take 1 tablet (137 mcg total) by mouth daily before breakfast. 09/28/13  Yes Webb Silversmith, NP  sertraline (ZOLOFT) 100 MG tablet Take 150 mg by mouth daily.    Yes Historical Provider, MD  simvastatin (ZOCOR) 10 MG tablet Take 1 tablet (10 mg total) by mouth daily. 10/01/13   Webb Silversmith, NP    No Known Allergies  Her family history includes Alcohol abuse in her paternal grandfather; Aneurysm in her paternal grandfather; COPD in her maternal grandmother; Cancer in her maternal aunt, maternal grandfather, maternal grandmother, and maternal uncle; Cancer (age of onset: 51) in her mother; Depression in her cousin, maternal grandmother, and sister; Diabetes in her father and paternal grandmother; Heart disease in her father; Hyperlipidemia in her father; Hypertension in her father and maternal grandmother; Hypothyroidism in her mother and sister; Migraines in her father; Stroke in her maternal grandmother and paternal grandmother.  She  reports that she has been smoking Cigarettes.  She has a 10 pack-year smoking history. She has never used smokeless tobacco. She reports that she drinks alcohol. She reports that she does not use illicit drugs.  Review of Systems  Constitutional: Negative for fever and unexpected weight change.  HENT:  Negative for congestion, dental problem, ear pain, nosebleeds, postnasal drip, rhinorrhea, sinus pressure, sneezing, sore throat and trouble swallowing.   Eyes: Negative for redness and itching.  Respiratory: Positive for cough and shortness of breath. Negative for chest tightness and wheezing.   Cardiovascular: Negative for palpitations and leg swelling.  Gastrointestinal: Negative for nausea and vomiting.  Genitourinary: Negative for dysuria.  Musculoskeletal: Negative for  joint swelling.  Skin: Negative for rash.  Neurological: Negative for headaches.  Hematological: Does not bruise/bleed easily.  Psychiatric/Behavioral: Negative for dysphoric mood. The patient is not nervous/anxious.    Physical Exam:  General - No distress ENT - No sinus tenderness, no oral exudate, no LAN, no thyromegaly, TM clear, pupils equal/reactive, MP 4 Cardiac - s1s2 regular, no murmur, pulses symmetric Chest - No wheeze/rales/dullness, good air entry, normal respiratory excursion Back - No focal tenderness Abd - Soft, non-tender, no organomegaly, + bowel sounds Ext - No edema Neuro - Normal strength, cranial nerves intact Skin - No rashes Psych - Normal mood, and behavior  Assessment/plan:  Chesley Mires, M.D. Pager [phone]

## 2013-10-23 ENCOUNTER — Encounter: Payer: Self-pay | Admitting: Certified Nurse Midwife

## 2013-10-23 ENCOUNTER — Ambulatory Visit: Payer: BC Managed Care – PPO | Admitting: Certified Nurse Midwife

## 2013-11-20 ENCOUNTER — Ambulatory Visit (INDEPENDENT_AMBULATORY_CARE_PROVIDER_SITE_OTHER): Payer: Managed Care, Other (non HMO) | Admitting: Family Medicine

## 2013-11-20 ENCOUNTER — Encounter: Payer: Self-pay | Admitting: Family Medicine

## 2013-11-20 ENCOUNTER — Encounter (INDEPENDENT_AMBULATORY_CARE_PROVIDER_SITE_OTHER): Payer: Self-pay

## 2013-11-20 ENCOUNTER — Other Ambulatory Visit: Payer: Self-pay | Admitting: Pulmonary Disease

## 2013-11-20 VITALS — BP 110/70 | HR 88 | Temp 98.1°F | Wt 214.0 lb

## 2013-11-20 DIAGNOSIS — R928 Other abnormal and inconclusive findings on diagnostic imaging of breast: Secondary | ICD-10-CM

## 2013-11-20 DIAGNOSIS — G4733 Obstructive sleep apnea (adult) (pediatric): Secondary | ICD-10-CM

## 2013-11-20 DIAGNOSIS — N63 Unspecified lump in unspecified breast: Secondary | ICD-10-CM | POA: Insufficient documentation

## 2013-11-20 NOTE — Progress Notes (Signed)
Pre visit review using our clinic review tool, if applicable. No additional management support is needed unless otherwise documented below in the visit note.  Lump in L breast.  Noted yesterday.  Not sore.  No other sx like this prev.  No nipple discharge.  No visible skin changes.   No R sided sx.    Has mirena with irregular menses, that is her baseline.   No FCNAV.    FH- aunt with breast cancer, late in life.    Meds, vitals, and allergies reviewed.   ROS: See HPI.  Otherwise, noncontributory.  nad Breast exam: No mass, nodules, thickening, tenderness, bulging, retraction, inflamation, nipple discharge or skin changes noted on the R breast.  L breast with normal exam (similar to the R breast) except for ~1cm nodular lesion at about 10:30. No axillary or clavicular LA B.  Chaperoned exam.

## 2013-11-20 NOTE — Assessment & Plan Note (Signed)
Refer for mammogram and u/s. D/w pt.

## 2013-11-20 NOTE — Patient Instructions (Signed)
Cheryl [name] will call about your referral. Take care.  We'll be in touch.

## 2013-11-26 ENCOUNTER — Ambulatory Visit: Payer: Self-pay | Admitting: Family Medicine

## 2013-11-26 IMAGING — US US BREAST*L* LIMITED INC AXILLA
1 series · 5 of 5 positions shown · non-contrast
Comparison: None.

CLINICAL DATA: Palpable left breast abnormality.

EXAM:
DIGITAL DIAGNOSTIC  BILATERAL MAMMOGRAM WITH CAD
ULTRASOUND LEFT BREAST

[Series 1: us breast*left* limited inc axilla · 0.08mm/px · 5 of 5 slices shown]
[im 1/5]
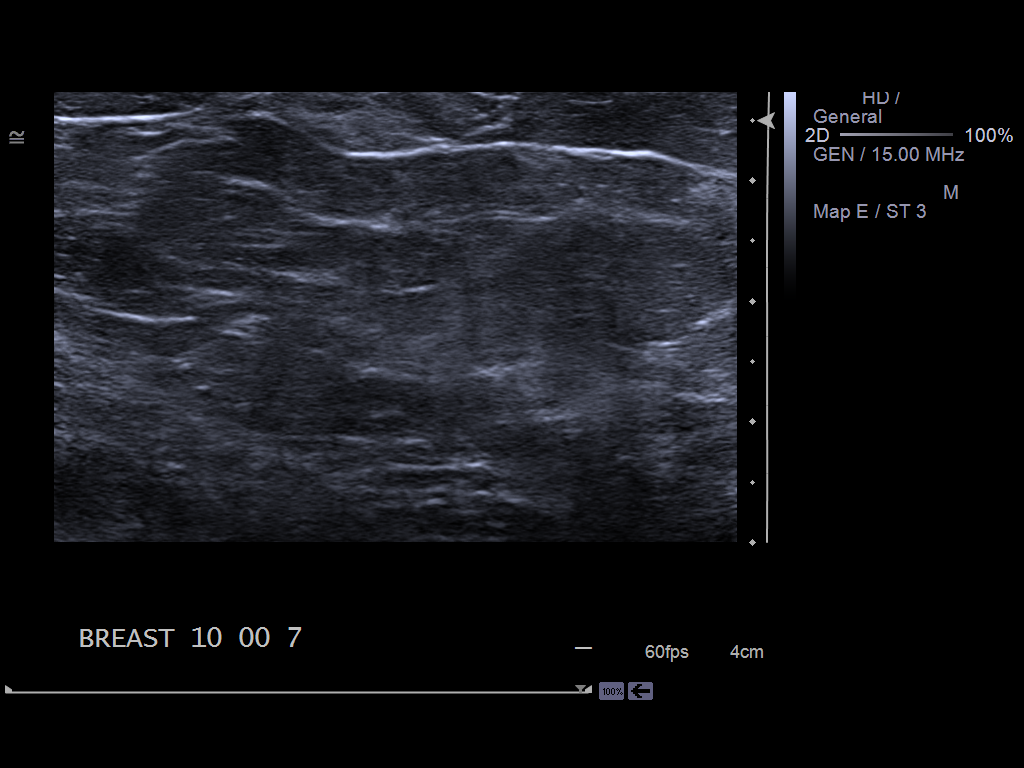
[im 2/5]
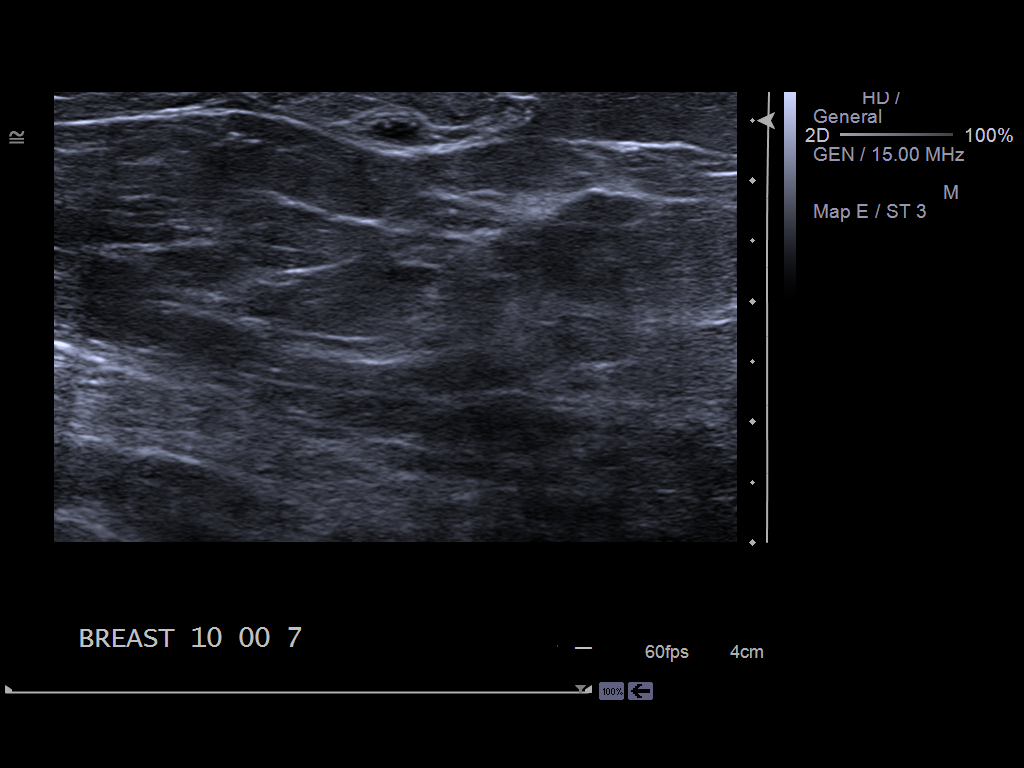
[im 3/5]
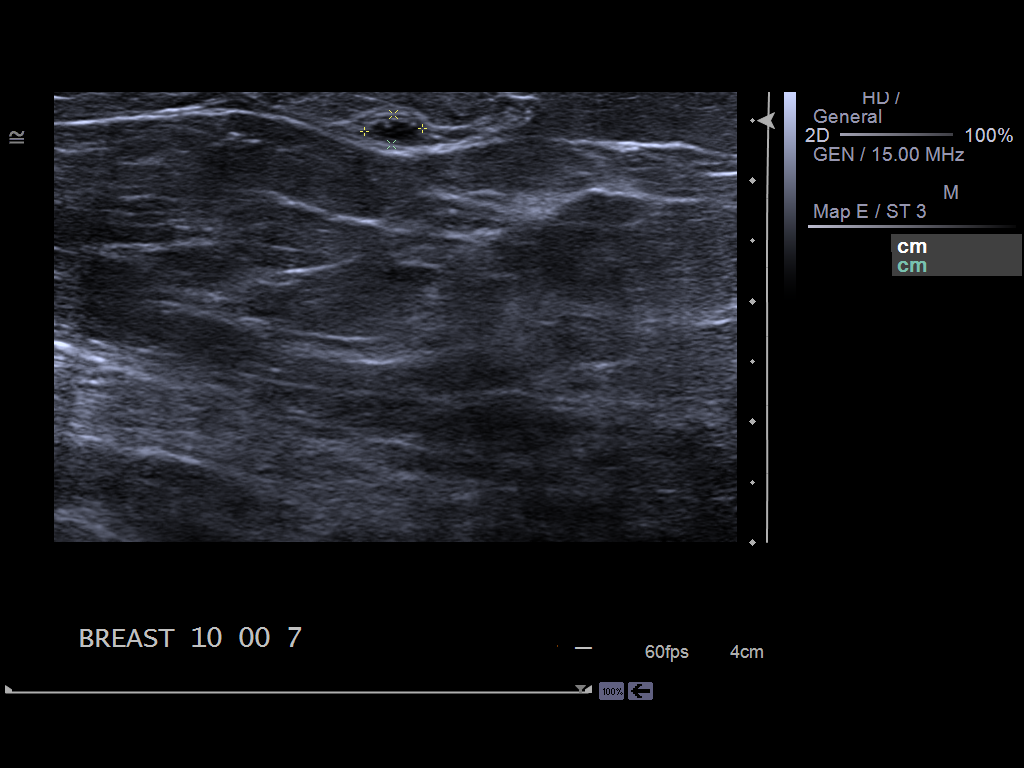
[im 4/5]
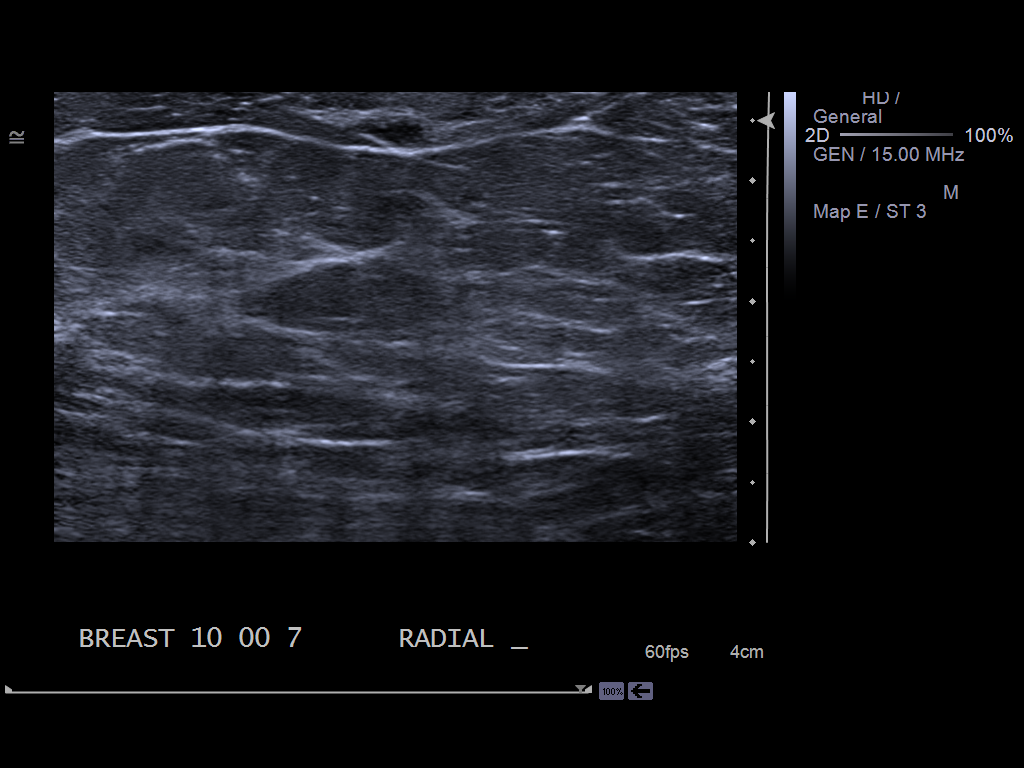
[im 5/5]
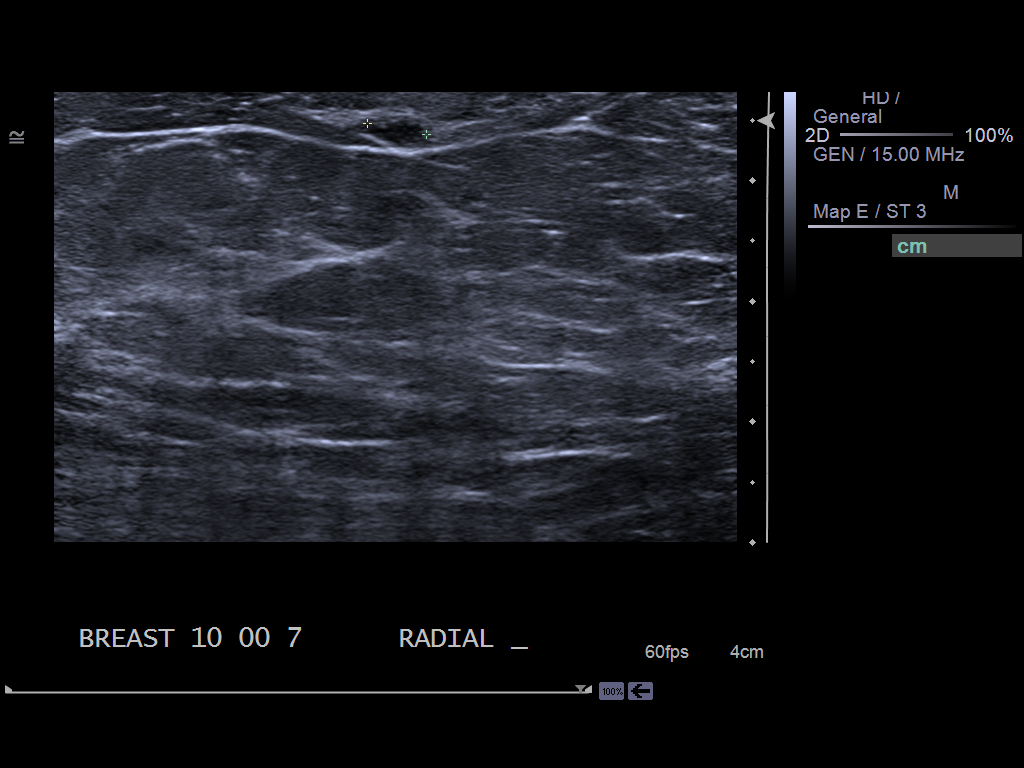

[5 of 5 positions shown; findings below may reference images not displayed]

ACR Breast Density Category b: There are scattered areas of
fibroglandular density.
FINDINGS: No suspicious mass, malignant type microcalcifications or distortion
detected. Spot tangential view of the area of clinical concern shows
a low-density 7 mm benign-appearing nodule. There are no malignant
type microcalcifications.

Mammographic images were processed with CAD.

On physical exam, I palpate soft thickening in the left breast at 10
o'clock 7 cm from the nipple.

Ultrasound is performed, showing a well circumscribed, hypoechoic
nodule measuring 5 x 3 x 5 mm. This may represent a cyst with
internal debris.
IMPRESSION: Probable benign lesion in the left breast.

RECOMMENDATION:
Short-term interval followup left breast ultrasound in 6 months is
recommended. The importance of self-breast examination was discussed
with the patient.

I have discussed the findings and recommendations with the patient.
Results were also provided in writing at the conclusion of the
visit. If applicable, a reminder letter will be sent to the patient
regarding the next appointment.

BI-RADS CATEGORY  3: Probably benign.

## 2013-11-27 ENCOUNTER — Encounter: Payer: Self-pay | Admitting: Family Medicine

## 2013-11-27 DIAGNOSIS — G4733 Obstructive sleep apnea (adult) (pediatric): Secondary | ICD-10-CM

## 2013-11-30 ENCOUNTER — Encounter (HOSPITAL_BASED_OUTPATIENT_CLINIC_OR_DEPARTMENT_OTHER): Payer: Managed Care, Other (non HMO)

## 2013-12-04 ENCOUNTER — Telehealth: Payer: Self-pay | Admitting: Pulmonary Disease

## 2013-12-04 DIAGNOSIS — G4733 Obstructive sleep apnea (adult) (pediatric): Secondary | ICD-10-CM

## 2013-12-04 NOTE — Telephone Encounter (Signed)
Called spoke with pt. Appt scheduled 12/05/13 at 3:15. Nothing further needed

## 2013-12-04 NOTE — Telephone Encounter (Signed)
HST 11/27/13 >> AHI 23.3, SaO2 80%.  Will have my nurse inform pt that sleep study shows moderate sleep apnea.  She will need ROV to discuss treatment options in more detail.

## 2013-12-05 ENCOUNTER — Ambulatory Visit (INDEPENDENT_AMBULATORY_CARE_PROVIDER_SITE_OTHER): Payer: Managed Care, Other (non HMO) | Admitting: Pulmonary Disease

## 2013-12-05 ENCOUNTER — Encounter: Payer: Self-pay | Admitting: Pulmonary Disease

## 2013-12-05 ENCOUNTER — Encounter (INDEPENDENT_AMBULATORY_CARE_PROVIDER_SITE_OTHER): Payer: Self-pay

## 2013-12-05 VITALS — BP 120/82 | HR 93 | Ht 66.0 in | Wt 215.6 lb

## 2013-12-05 DIAGNOSIS — G4733 Obstructive sleep apnea (adult) (pediatric): Secondary | ICD-10-CM

## 2013-12-05 NOTE — Progress Notes (Signed)
Chief Complaint  Patient presents with  . Follow-up    review sleep study    History of Present Illness: Cheryl [name] is a 38 y.o. female with OSA.   She is here to review her home sleep study.  This showed moderate sleep apnea.  TESTS: HST 11/27/13 >> AHI 23.3, SaO2 80%.  Cheryl [name]  has a past medical history of Depression; Thyroid disease; Bipolar 1 disorder; Migraines; and Diabetes mellitus without complication.  Cheryl [name]  has past surgical history that includes Wisdom tooth extraction; Nasal septum surgery; Intrauterine device (iud) insertion (5/13); and Other surgical history.  Prior to Admission medications   Medication Sig Start Date End Date Taking? Authorizing Provider  IBUPROFEN PO Take by mouth as needed.   Yes Historical Provider, MD  lamoTRIgine (LAMICTAL) 100 MG tablet Take 1 tablet in the AM, 1 tablet in the PM   Yes Historical Provider, MD  levonorgestrel (MIRENA) 20 MCG/24HR IUD 1 Intra Uterine Device (1 each total) by Intrauterine route once. 03/06/12  Yes Rosalita Chessman, DO  levothyroxine (SYNTHROID, LEVOTHROID) 137 MCG tablet Take 1 tablet (137 mcg total) by mouth daily before breakfast. 09/28/13  Yes Webb Silversmith, NP  sertraline (ZOLOFT) 100 MG tablet Take 150 mg by mouth daily.    Yes Historical Provider, MD  simvastatin (ZOCOR) 10 MG tablet Take 1 tablet (10 mg total) by mouth daily. 10/01/13  Yes Webb Silversmith, NP    No Known Allergies   Physical Exam:  General - No distress ENT - No sinus tenderness, no oral exudate, no LAN, MP 4 Cardiac - s1s2 regular, no murmur Chest - No wheeze/rales/dullness Back - No focal tenderness Abd - Soft, non-tender Ext - No edema Neuro - Normal strength Skin - No rashes Psych - normal mood, and behavior   Assessment/Plan:  Chesley Mires, MD [facility]/Sleep Pager:  [phone]

## 2013-12-05 NOTE — Patient Instructions (Signed)
Will arrange for CPAP set up  Follow up in 2 months after CPAP set up 

## 2013-12-05 NOTE — Assessment & Plan Note (Signed)
She has moderate sleep apnea.  I have reviewed the recent sleep study results with the patient.  We discussed how sleep apnea can affect various health problems including risks for hypertension, cardiovascular disease, and diabetes.  We also discussed how sleep disruption can increase risks for accident, such as while driving.  Weight loss as a means of improving sleep apnea was also reviewed.  Additional treatment options discussed were CPAP therapy, oral appliance, and surgical intervention.  Will arrange for auto CPAP set up.

## 2013-12-06 ENCOUNTER — Encounter: Payer: Self-pay | Admitting: Pulmonary Disease

## 2013-12-29 ENCOUNTER — Encounter: Payer: Self-pay | Admitting: Pulmonary Disease

## 2014-01-16 ENCOUNTER — Other Ambulatory Visit: Payer: Self-pay | Admitting: Internal Medicine

## 2014-01-16 DIAGNOSIS — E785 Hyperlipidemia, unspecified: Secondary | ICD-10-CM

## 2014-01-25 ENCOUNTER — Other Ambulatory Visit (INDEPENDENT_AMBULATORY_CARE_PROVIDER_SITE_OTHER): Payer: Managed Care, Other (non HMO)

## 2014-01-25 DIAGNOSIS — E785 Hyperlipidemia, unspecified: Secondary | ICD-10-CM

## 2014-01-25 LAB — LIPID PANEL
Cholesterol: 150 mg/dL (ref 0–200)
HDL: 35.3 mg/dL — AB (ref >39.00)
NonHDL: 114.7
TRIGLYCERIDES: 233 mg/dL — AB (ref 0.0–149.0)
Total CHOL/HDL Ratio: 4
VLDL: 46.6 mg/dL — ABNORMAL HIGH (ref 0.0–40.0)

## 2014-01-25 LAB — LDL CHOLESTEROL, DIRECT: Direct LDL: 98.7 mg/dL

## 2014-02-04 ENCOUNTER — Ambulatory Visit (INDEPENDENT_AMBULATORY_CARE_PROVIDER_SITE_OTHER): Payer: Managed Care, Other (non HMO) | Admitting: Pulmonary Disease

## 2014-02-04 ENCOUNTER — Encounter: Payer: Self-pay | Admitting: Pulmonary Disease

## 2014-02-04 VITALS — BP 120/84 | HR 87 | Ht 66.0 in | Wt 216.0 lb

## 2014-02-04 DIAGNOSIS — G4733 Obstructive sleep apnea (adult) (pediatric): Secondary | ICD-10-CM

## 2014-02-04 NOTE — Assessment & Plan Note (Signed)
She is compliant with therapy and reports benefit from CPAP.  Will call her with results of CPAP report.

## 2014-02-04 NOTE — Progress Notes (Signed)
Chief Complaint  Patient presents with  . Follow-up    Wears CPAP nightly x 6-7hrs. Denies problems with mask/pressure. Pt states that she notices at times the mask loosens.    History of Present Illness: Cheryl [name] is a 38 y.o. female with OSA.   She has been doing well with CPAP.  She is not having issue with her mask.  She is sleeping better, and no longer needs to take naps during the day.  She is no longer snoring.  TESTS: HST 11/27/13 >> AHI 23.3, SaO2 80%. Auto CPAP 12/13/13 to 12/26/13 >> used on 14 of 14 nights with average 6 hrs and 30 min.  Average AHI is 2.2 with median CPAP 8.3 cm H2O and 95 th percentile CPAP 11 cm H2O.  PMHx, PSHx, Medications, Allergies, Fhx, Shx reviewed.  Physical Exam:  General - No distress ENT - No sinus tenderness, no oral exudate, no LAN, MP 4 Cardiac - s1s2 regular, no murmur Chest - No wheeze/rales/dullness Back - No focal tenderness Abd - Soft, non-tender Ext - No edema Neuro - Normal strength Skin - No rashes Psych - normal mood, and behavior   Assessment/Plan:  Cheryl Mires, MD [facility]Critical Care/Sleep Pager:  [phone]

## 2014-02-04 NOTE — Patient Instructions (Signed)
Will get copy of your CPAP report  Follow up in 1 year 

## 2014-02-05 ENCOUNTER — Telehealth: Payer: Self-pay | Admitting: Pulmonary Disease

## 2014-02-05 NOTE — Telephone Encounter (Signed)
Auto CPAP 12/13/13 to 12/26/13 >> used on 14 of 14 nights with average 6 hrs and 30 min.  Average AHI is 2.2 with median CPAP 8.3 cm H2O and 95 th percentile CPAP 11 cm H2O.  Will have my nurse inform pt that CPAP report shows excellent control of sleep apnea.  No change to current set up needed.

## 2014-02-06 NOTE — Telephone Encounter (Signed)
Results have been explained to patient, pt expressed understanding. Nothing further needed.  

## 2014-02-26 ENCOUNTER — Telehealth: Payer: Self-pay | Admitting: Certified Nurse Midwife

## 2014-02-26 NOTE — Telephone Encounter (Signed)
Patient calling re: [facility] appointment for AEX with Johny Shock, CNM 10/23/13. The patient states she pressed 2 to cancel the appointment. The call log shows "Call Failed" and did not indicate her response. May we waive this fee this time for this patient?

## 2014-02-28 ENCOUNTER — Other Ambulatory Visit: Payer: Self-pay | Admitting: Internal Medicine

## 2014-02-28 NOTE — Telephone Encounter (Signed)
Please notify patient will waive fee this once.

## 2014-02-28 NOTE — Telephone Encounter (Signed)
Patient notified

## 2014-03-01 NOTE — Telephone Encounter (Signed)
Pt had labs 01/25/14 and chol had dropped to normal--i saw in the not where you advised pt to start fish oil--is pt still supposed to be taking the chol meds--please advise

## 2014-03-01 NOTE — Telephone Encounter (Signed)
Yes continue cholesterol medication and add fish oil

## 2014-05-31 ENCOUNTER — Other Ambulatory Visit: Payer: Self-pay

## 2014-05-31 DIAGNOSIS — E039 Hypothyroidism, unspecified: Secondary | ICD-10-CM

## 2014-05-31 MED ORDER — LEVOTHYROXINE SODIUM 137 MCG PO TABS: 137.0000 ug | ORAL_TABLET | Freq: Every day | ORAL | Status: DC

## 2014-12-10 ENCOUNTER — Telehealth: Payer: Self-pay

## 2014-12-10 NOTE — Telephone Encounter (Signed)
Diabetic Bundle. Left voicemail advising pt her A1C blood test is due. Pt advised to contact PCP's office to schedule.  

## 2014-12-24 ENCOUNTER — Other Ambulatory Visit: Payer: Self-pay | Admitting: Internal Medicine

## 2015-01-02 ENCOUNTER — Other Ambulatory Visit: Payer: Self-pay

## 2015-01-02 MED ORDER — SIMVASTATIN 10 MG PO TABS: 10.0000 mg | ORAL_TABLET | Freq: Every day | ORAL | Status: DC

## 2015-03-05 ENCOUNTER — Other Ambulatory Visit: Payer: Self-pay | Admitting: Internal Medicine

## 2015-06-25 ENCOUNTER — Encounter: Payer: Self-pay | Admitting: Internal Medicine

## 2015-07-16 IMAGING — US US SOFT TISSUE HEAD/NECK
1 series · 13 of 25 positions shown · non-contrast
Comparison: None.

CLINICAL DATA: Thyromegaly and hypothyroidism for many years per
patient. Pt states in the last few months she has had difficultly
swallowing and eating and feeling of fullness in neck area. Pt
getting ready to restart meds for thyroid.

EXAM:
THYROID ULTRASOUND
TECHNIQUE: Ultrasound examination of the thyroid gland and adjacent soft
tissues was performed.

[Series 1: us soft tissue head/neck · 0.07mm/px · 13 of 45 slices shown]
[im 1/45]
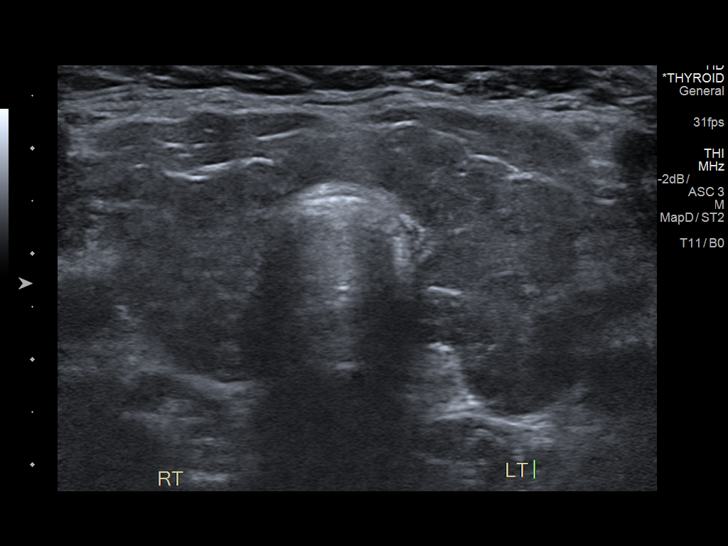
[im 4/45]
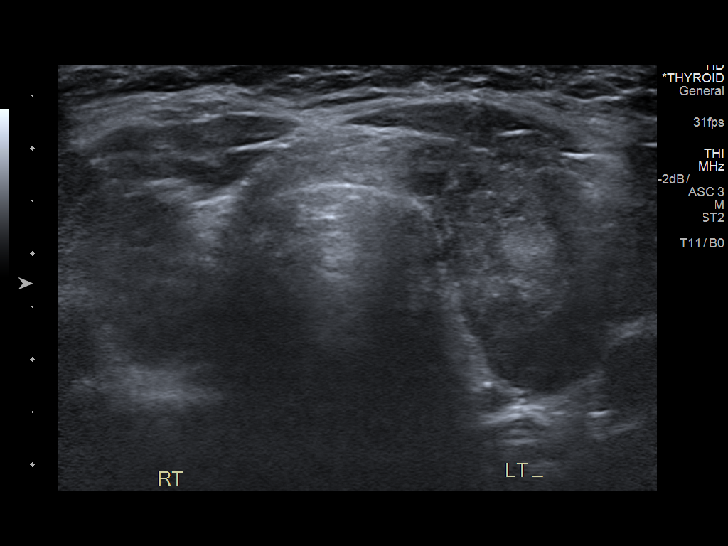
[im 8/45]
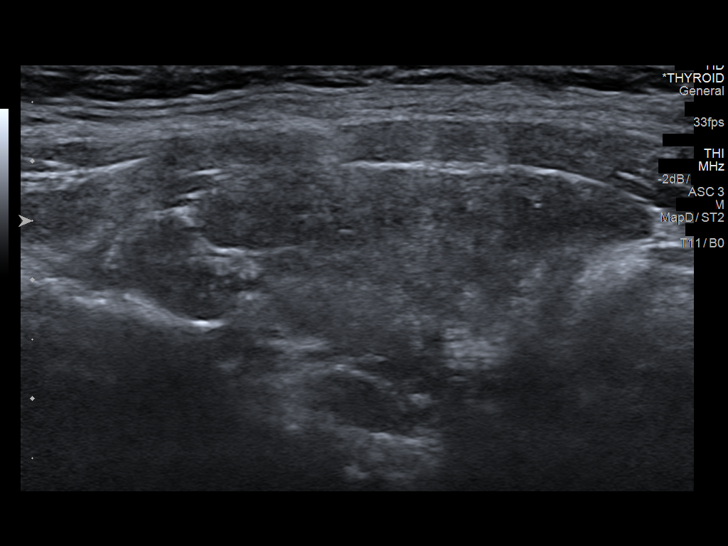
[im 12/45]
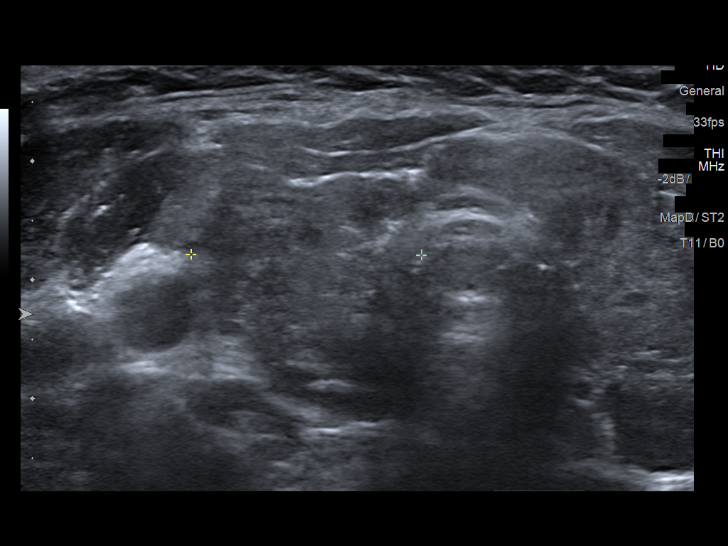
[im 15/45]
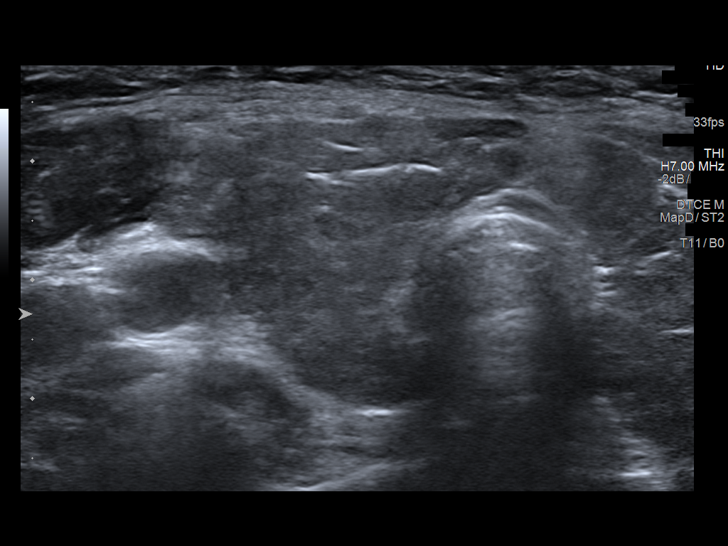
[im 19/45]
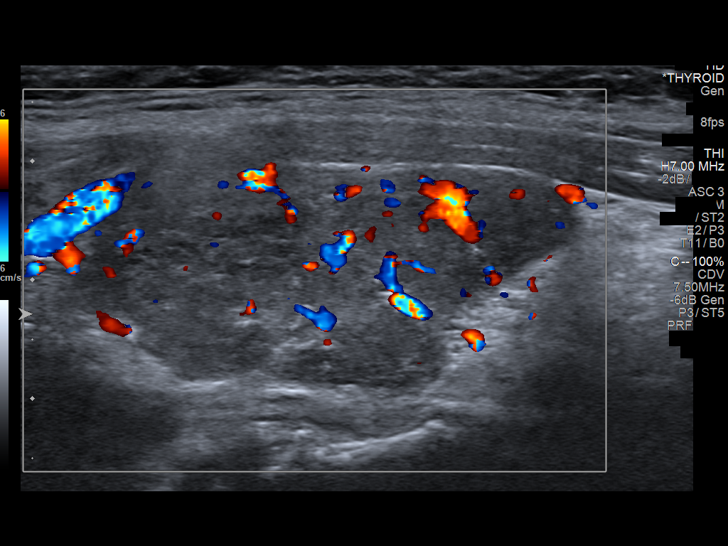
[im 23/45]
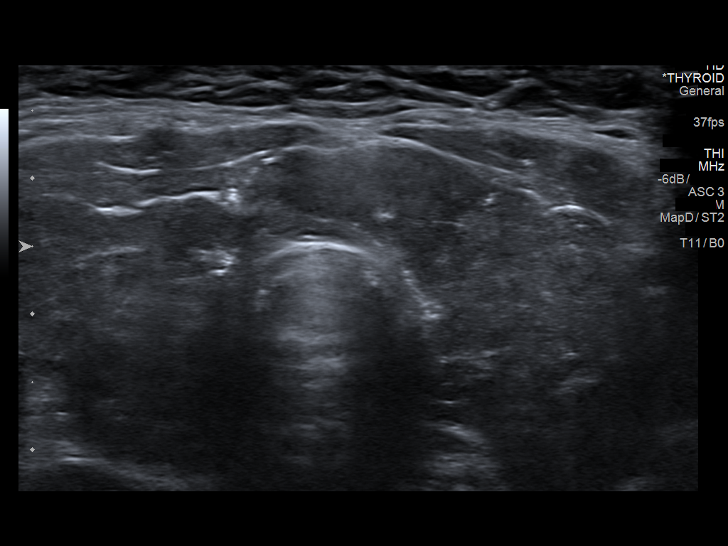
[im 26/45]
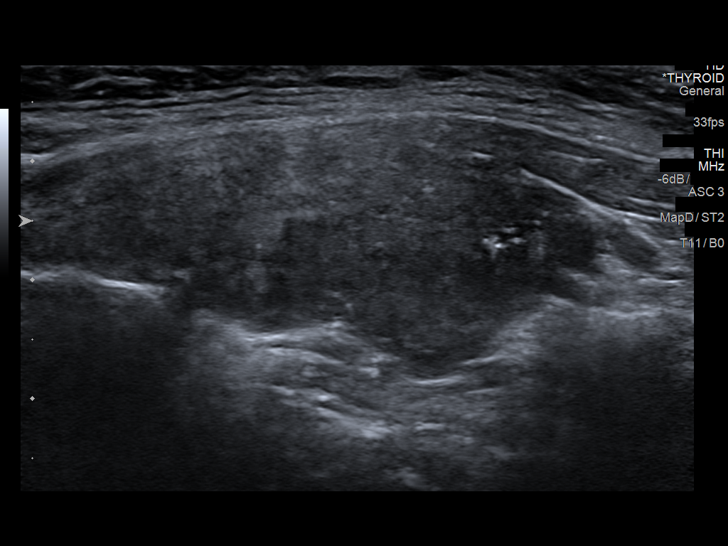
[im 30/45]
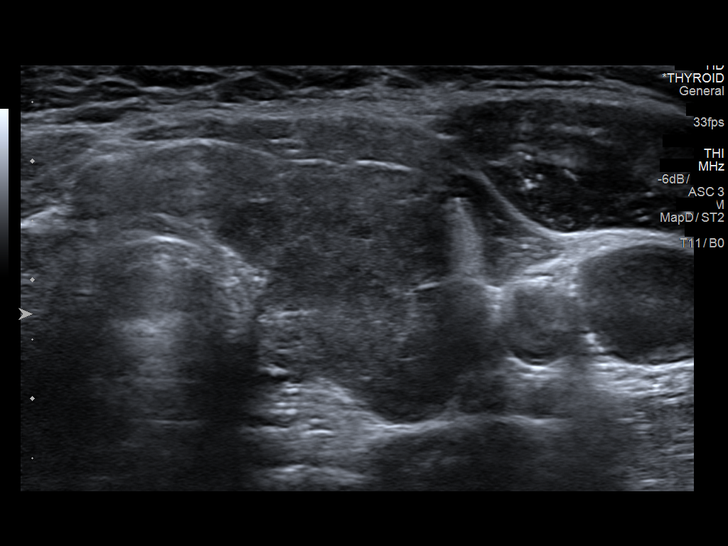
[im 34/45]
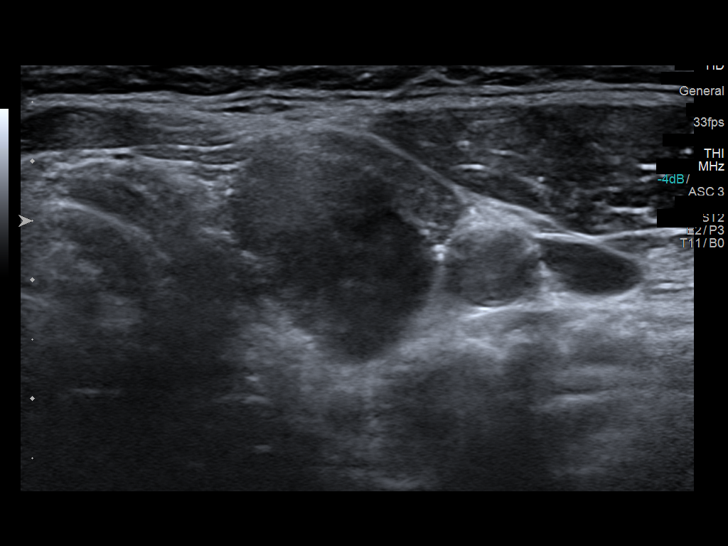
[im 37/45]
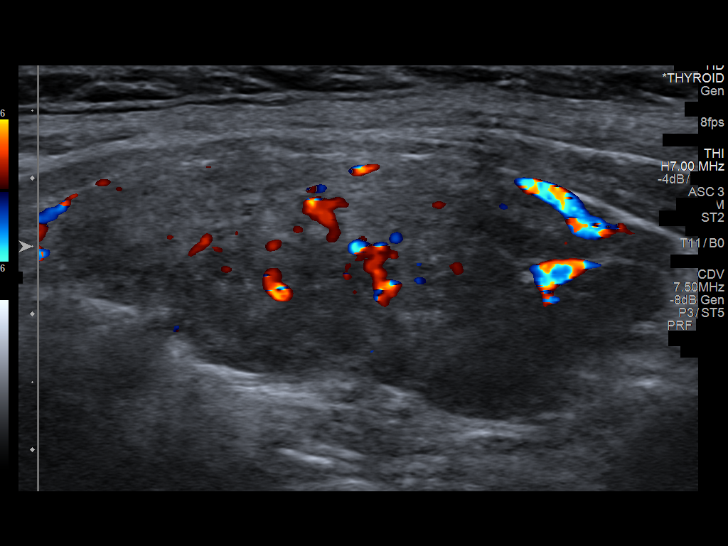
[im 41/45]
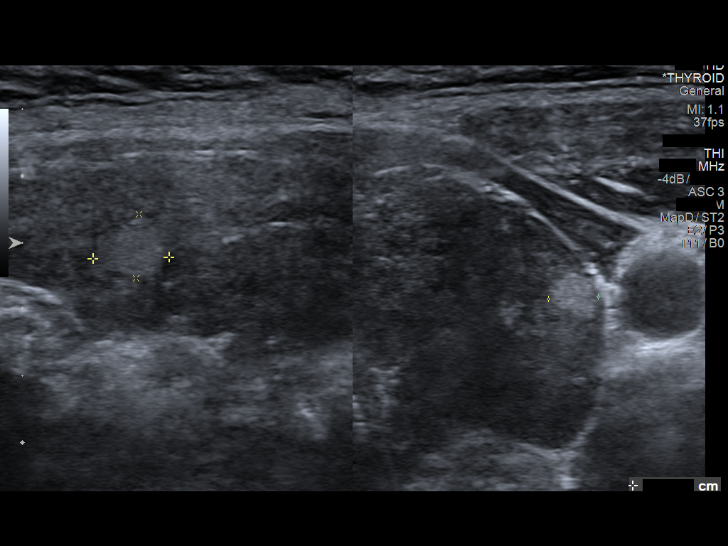
[im 45/45]
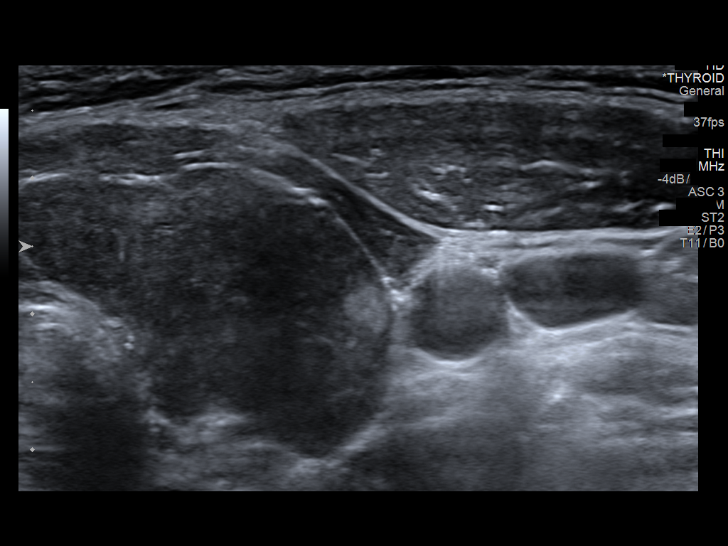

[13 of 25 positions shown; findings below may reference images not displayed]

FINDINGS: Right thyroid lobe

Measurements: 43 x 19 x 19 mm. Coarse nodular heterogeneous
echotexture with mild hyperemia. No focal lesion.

Left thyroid lobe

Measurements: 48 x 22 x 21 mm. Heterogeneous background echotexture
with a single small 6 x 5 x 4 mm echogenic nodule, superior pole.

Isthmus

Thickness: 6 mm.  No nodules visualized.

Lymphadenopathy

None visualized.
IMPRESSION: 1. Normal-sized thyroid with heterogeneous echotexture, and a single
6 mm left nodule. Findings do not meet current consensus criteria
for biopsy. Follow-up by clinical exam is recommended. If patient
has known risk factors for thyroid carcinoma, consider follow-up
ultrasound in 12 months. If patient is clinically hyperthyroid,
consider nuclear medicine thyroid uptake and scan. This
recommendation follows the consensus statement: Management of
Thyroid Nodules Detected as US: Society of Radiologists in

## 2015-08-01 ENCOUNTER — Encounter: Payer: Self-pay | Admitting: Family Medicine

## 2015-08-01 ENCOUNTER — Ambulatory Visit (HOSPITAL_BASED_OUTPATIENT_CLINIC_OR_DEPARTMENT_OTHER)
Admission: RE | Admit: 2015-08-01 | Discharge: 2015-08-01 | Disposition: A | Discharge status: Home / Self Care | Payer: 59 | Source: Ambulatory Visit | Attending: Family Medicine | Admitting: Family Medicine

## 2015-08-01 ENCOUNTER — Ambulatory Visit (INDEPENDENT_AMBULATORY_CARE_PROVIDER_SITE_OTHER): Payer: 59 | Admitting: Family Medicine

## 2015-08-01 VITALS — BP 122/70 | HR 80 | Temp 98.3°F | Ht 66.0 in | Wt 232.0 lb

## 2015-08-01 DIAGNOSIS — E041 Nontoxic single thyroid nodule: Secondary | ICD-10-CM | POA: Diagnosis not present

## 2015-08-01 DIAGNOSIS — E039 Hypothyroidism, unspecified: Secondary | ICD-10-CM | POA: Diagnosis present

## 2015-08-01 DIAGNOSIS — E1151 Type 2 diabetes mellitus with diabetic peripheral angiopathy without gangrene: Secondary | ICD-10-CM | POA: Diagnosis not present

## 2015-08-01 DIAGNOSIS — E785 Hyperlipidemia, unspecified: Secondary | ICD-10-CM

## 2015-08-01 DIAGNOSIS — F411 Generalized anxiety disorder: Secondary | ICD-10-CM

## 2015-08-01 LAB — COMPREHENSIVE METABOLIC PANEL
ALT: 35 U/L (ref 0–35)
AST: 36 U/L (ref 0–37)
Albumin: 4.5 g/dL (ref 3.5–5.2)
Alkaline Phosphatase: 79 U/L (ref 39–117)
BUN: 16 mg/dL (ref 6–23)
CHLORIDE: 100 meq/L (ref 96–112)
CO2: 31 meq/L (ref 19–32)
CREATININE: 1.03 mg/dL (ref 0.40–1.20)
Calcium: 9.8 mg/dL (ref 8.4–10.5)
GFR: 63.19 mL/min (ref >60.00)
GLUCOSE: 108 mg/dL — AB (ref 70–99)
Potassium: 3.6 mEq/L (ref 3.5–5.1)
Sodium: 138 mEq/L (ref 135–145)
Total Bilirubin: 0.3 mg/dL (ref 0.2–1.2)
Total Protein: 7.9 g/dL (ref 6.0–8.3)

## 2015-08-01 LAB — LIPID PANEL
Cholesterol: 345 mg/dL — ABNORMAL HIGH (ref 0–200)
HDL: 42.5 mg/dL (ref >39.00)
Total CHOL/HDL Ratio: 8
Triglycerides: 592 mg/dL — ABNORMAL HIGH (ref 0.0–149.0)

## 2015-08-01 LAB — THYROID PANEL WITH TSH
T3 UPTAKE: 23 % (ref 22–35)
T4, Total: <0.7 ug/dL — ABNORMAL LOW (ref 4.5–12.0)
TSH: 66.72 m[IU]/L — AB

## 2015-08-01 LAB — HEMOGLOBIN A1C: Hgb A1c MFr Bld: 6.8 % — ABNORMAL HIGH (ref 4.6–6.5)

## 2015-08-01 LAB — LDL CHOLESTEROL, DIRECT: Direct LDL: 193 mg/dL

## 2015-08-01 LAB — MICROALBUMIN / CREATININE URINE RATIO
CREATININE, U: 208.2 mg/dL
MICROALB UR: 5.5 mg/dL — AB (ref 0.0–1.9)
MICROALB/CREAT RATIO: 2.6 mg/g (ref 0.0–30.0)

## 2015-08-01 MED ORDER — LEVOTHYROXINE SODIUM 137 MCG PO TABS: 137.0000 ug | ORAL_TABLET | Freq: Every day | ORAL | Status: DC

## 2015-08-01 MED ORDER — GLUCOSE BLOOD VI STRP: ORAL_STRIP | Status: DC

## 2015-08-01 MED ORDER — ONETOUCH ULTRASOFT LANCETS MISC: Status: DC

## 2015-08-01 NOTE — Progress Notes (Signed)
Pre visit review using our clinic review tool, if applicable. No additional management support is needed unless otherwise documented below in the visit note. 

## 2015-08-01 NOTE — Patient Instructions (Signed)

## 2015-08-01 NOTE — Progress Notes (Signed)
Patient ID: Cheryl [name], female    DOB: 07-09-1975  Age: 40 y.o. MRN: [id number]    Subjective:  Subjective HPI BURNELL MATLIN presents for f/u dm , cholesteol and htn.   HPI HYPERTENSION  Blood pressure range-not checking  Chest pain- no      Dyspnea- no Lightheadedness- no   Edema- no Other side effects - no   Medication compliance: good Low salt diet- yes   DIABETES  Blood Sugar ranges-not checking   Polyuria- no New Visual problems- no Hypoglycemic symptoms- no Other side effects-no Medication compliance - poor Last eye exam- due Foot exam- today  HYPERLIPIDEMIA  Medication compliance- good RUQ pain- no  Muscle aches- no Other side effects-no    Review of Systems  Constitutional: Negative for diaphoresis, appetite change, fatigue and unexpected weight change.  Eyes: Negative for pain, redness and visual disturbance.  Respiratory: Negative for cough, chest tightness, shortness of breath and wheezing.   Cardiovascular: Negative for chest pain, palpitations and leg swelling.  Endocrine: Negative for cold intolerance, heat intolerance, polydipsia, polyphagia and polyuria.  Genitourinary: Negative for dysuria, frequency and difficulty urinating.  Neurological: Negative for dizziness, light-headedness, numbness and headaches.    History Past Medical History  Diagnosis Date  . Depression   . Thyroid disease   . Bipolar 1 disorder ([place])   . Migraines     with aura  . Diabetes mellitus without complication ([place])   . OSA (obstructive sleep apnea) 10/17/2013    She has past surgical history that includes Wisdom tooth extraction; Nasal septum surgery; Intrauterine device (iud) insertion (5/13); and Other surgical history.   Her family history includes Alcohol abuse in her paternal grandfather; Aneurysm in her paternal grandfather; COPD in her maternal grandmother; Cancer in her maternal aunt, maternal grandfather, maternal grandmother, and maternal  uncle; Cancer (age of onset: 75) in her mother; Depression in her cousin, maternal grandmother, and sister; Diabetes in her father and paternal grandmother; Heart disease in her father; Hyperlipidemia in her father; Hypertension in her father and maternal grandmother; Hypothyroidism in her mother and sister; Migraines in her father; Stroke in her maternal grandmother and paternal grandmother.She reports that she has been smoking Cigarettes.  She has a 10 pack-year smoking history. She has never used smokeless tobacco. She reports that she drinks alcohol. She reports that she does not use illicit drugs.  Current Outpatient Prescriptions on File Prior to Visit  Medication Sig Dispense Refill  . IBUPROFEN PO Take by mouth as needed.    . lamoTRIgine (LAMICTAL) 100 MG tablet Take 1 tablet in the AM, 1 tablet in the PM    . levonorgestrel (MIRENA) 20 MCG/24HR IUD 1 Intra Uterine Device (1 each total) by Intrauterine route once. 1 each 0  . sertraline (ZOLOFT) 100 MG tablet Take 150 mg by mouth daily.      No current facility-administered medications on file prior to visit.     Objective:  Objective Physical Exam  Constitutional: She is oriented to person, place, and time. She appears well-developed and well-nourished.  HENT:  Head: Normocephalic and atraumatic.  Eyes: Conjunctivae and EOM are normal.  Neck: Normal range of motion. Neck supple. No JVD present. Carotid bruit is not present. Thyromegaly present.  Cardiovascular: Normal rate, regular rhythm and normal heart sounds.   No murmur heard. Pulmonary/Chest: Effort normal and breath sounds normal. No respiratory distress. She has no wheezes. She has no rales. She exhibits no tenderness.  Musculoskeletal: She exhibits no edema.  Neurological: She is alert and oriented to person, place, and time.  Psychiatric: She has a normal mood and affect. Her behavior is normal. Thought content normal.  Nursing note and vitals reviewed.  BP 122/70 mmHg   Pulse 80  Temp(Src) 98.3 F (36.8 C) (Oral)  Ht _0  (1.676 m)  Wt 232 lb (105.235 kg)  BMI 37.46 kg/m2  SpO2 97% Wt Readings from Last 3 Encounters:  08/01/15 232 lb (105.235 kg)  02/04/14 216 lb (97.977 kg)  12/05/13 215 lb 9.6 oz (97.796 kg)     Lab Results  Component Value Date   WBC 8.3 09/28/2013   HGB 13.6 09/28/2013   HCT 40.1 09/28/2013   PLT 257.0 09/28/2013   GLUCOSE 108* 08/01/2015   CHOL 345* 08/01/2015   TRIG * 08/01/2015    592.0 Triglyceride is over 400; calculations on Lipids are invalid.   HDL 42.50 08/01/2015   LDLDIRECT 193.0 08/01/2015   LDLCALC 206* 09/28/2013   ALT 35 08/01/2015   AST 36 08/01/2015   NA 138 08/01/2015   K 3.6 08/01/2015   CL 100 08/01/2015   CREATININE 1.03 08/01/2015   BUN 16 08/01/2015   CO2 31 08/01/2015   TSH 66.72* 08/01/2015   HGBA1C 6.8* 08/01/2015   MICROALBUR 5.5* 08/01/2015    No results found.   Assessment & Plan:  Plan I am having Ms. Stanfill start on glucose blood and onetouch ultrasoft. I am also having her maintain her levonorgestrel, IBUPROFEN PO, lamoTRIgine, sertraline, clonazePAM, and levothyroxine.  Meds ordered this encounter  Medications  . clonazePAM (KLONOPIN) 0.5 MG tablet    Sig: Take 0.5 mg by mouth 2 (two) times daily as needed for anxiety.  Marland Kitchen glucose blood test strip    Sig: Use as instructed daily, one touch verio flex    Dispense:  100 each    Refill:  12  . Lancets (ONETOUCH ULTRASOFT) lancets    Sig: Use as instructed    Dispense:  100 each    Refill:  12  . levothyroxine (SYNTHROID, LEVOTHROID) 137 MCG tablet    Sig: Take 1 tablet (137 mcg total) by mouth daily before breakfast. MUST SCHEDULE ANNUAL PHYSICAL FOR FURTHER REFILLS [phone]    Dispense:  30 tablet    Refill:  1    Problem List Items Addressed This Visit    None    Visit Diagnoses    Hypothyroidism, unspecified hypothyroidism type    -  Primary    Relevant Medications    levothyroxine (SYNTHROID,  LEVOTHROID) 137 MCG tablet    Other Relevant Orders    Comp Met (CMET) (Completed)    Lipid panel (Completed)    Thyroid Panel With TSH (Completed)    US Soft Tissue Head/Neck (Completed)    Hyperlipidemia        Relevant Orders    Comp Met (CMET) (Completed)    Lipid panel (Completed)    DM (diabetes mellitus) type II controlled peripheral vascular disorder (HCC)        Relevant Medications    glucose blood test strip    Lancets (ONETOUCH ULTRASOFT) lancets    Other Relevant Orders    Microalbumin / creatinine urine ratio (Completed)    Hemoglobin A1c (Completed)       Follow-up: Return in about 3 months (around 10/29/2015), or if symptoms worsen or fail to improve, for hyperlipidemia, diabetes II.  Garnet Koyanagi, DO

## 2015-08-02 ENCOUNTER — Other Ambulatory Visit: Payer: Self-pay | Admitting: Family Medicine

## 2015-08-02 DIAGNOSIS — E785 Hyperlipidemia, unspecified: Secondary | ICD-10-CM

## 2015-08-02 MED ORDER — SIMVASTATIN 10 MG PO TABS: 10.0000 mg | ORAL_TABLET | Freq: Every day | ORAL | Status: DC

## 2015-09-23 ENCOUNTER — Other Ambulatory Visit: Payer: Self-pay | Admitting: Family Medicine

## 2015-09-23 ENCOUNTER — Encounter: Payer: Self-pay | Admitting: Family Medicine

## 2015-09-23 DIAGNOSIS — F329 Major depressive disorder, single episode, unspecified: Secondary | ICD-10-CM

## 2015-09-23 DIAGNOSIS — F32A Depression, unspecified: Secondary | ICD-10-CM

## 2015-09-23 MED ORDER — SERTRALINE HCL 100 MG PO TABS: 150.0000 mg | ORAL_TABLET | Freq: Every day | ORAL | Status: DC

## 2015-10-20 ENCOUNTER — Encounter: Payer: Self-pay | Admitting: Family Medicine

## 2015-10-20 MED ORDER — LAMOTRIGINE 100 MG PO TABS: ORAL_TABLET | ORAL | Status: DC

## 2015-10-20 NOTE — Telephone Encounter (Signed)
Refill for 1 month.

## 2015-10-20 NOTE — Telephone Encounter (Signed)
The Rx was never filled here.      KP

## 2015-10-21 ENCOUNTER — Other Ambulatory Visit: Payer: Self-pay | Admitting: Family Medicine

## 2015-10-30 ENCOUNTER — Ambulatory Visit (INDEPENDENT_AMBULATORY_CARE_PROVIDER_SITE_OTHER): Payer: 59 | Admitting: Family Medicine

## 2015-10-30 VITALS — BP 133/83 | HR 83 | Temp 98.3°F | Ht 66.0 in | Wt 226.4 lb

## 2015-10-30 DIAGNOSIS — E039 Hypothyroidism, unspecified: Secondary | ICD-10-CM

## 2015-10-30 DIAGNOSIS — F32A Depression, unspecified: Secondary | ICD-10-CM

## 2015-10-30 DIAGNOSIS — E118 Type 2 diabetes mellitus with unspecified complications: Secondary | ICD-10-CM | POA: Diagnosis not present

## 2015-10-30 DIAGNOSIS — F329 Major depressive disorder, single episode, unspecified: Secondary | ICD-10-CM

## 2015-10-30 DIAGNOSIS — E785 Hyperlipidemia, unspecified: Secondary | ICD-10-CM

## 2015-10-30 DIAGNOSIS — F411 Generalized anxiety disorder: Secondary | ICD-10-CM

## 2015-10-30 DIAGNOSIS — F3181 Bipolar II disorder: Secondary | ICD-10-CM

## 2015-10-30 MED ORDER — LEVOTHYROXINE SODIUM 137 MCG PO TABS: 137.0000 ug | ORAL_TABLET | Freq: Every day | ORAL | Status: DC

## 2015-10-30 MED ORDER — LEVOTHYROXINE SODIUM 137 MCG PO TABS: ORAL_TABLET | ORAL | Status: DC

## 2015-10-30 MED ORDER — CLONAZEPAM 0.5 MG PO TABS: 0.5000 mg | ORAL_TABLET | Freq: Two times a day (BID) | ORAL | Status: DC | PRN

## 2015-10-30 MED ORDER — SIMVASTATIN 10 MG PO TABS: 10.0000 mg | ORAL_TABLET | Freq: Every day | ORAL | Status: DC

## 2015-10-30 MED ORDER — SERTRALINE HCL 100 MG PO TABS: 100.0000 mg | ORAL_TABLET | Freq: Every day | ORAL | Status: DC

## 2015-10-30 MED ORDER — LAMOTRIGINE 100 MG PO TABS: ORAL_TABLET | ORAL | Status: DC

## 2015-10-30 NOTE — Progress Notes (Signed)
Patient ID: Cheryl [name], female    DOB: [birth year]  Age: 40 y.o. MRN: [id number]    Subjective:  Subjective HPI Cheryl [name] presents for f/u dm , thyroid and bipolar.  She is switching to [facility] at [facility].    Review of Systems  Constitutional: Negative for diaphoresis, appetite change, fatigue and unexpected weight change.  Eyes: Negative for pain, redness and visual disturbance.  Respiratory: Negative for cough, chest tightness, shortness of breath and wheezing.   Cardiovascular: Negative for chest pain, palpitations and leg swelling.  Endocrine: Negative for cold intolerance, heat intolerance, polydipsia, polyphagia and polyuria.  Genitourinary: Negative for dysuria, frequency and difficulty urinating.  Neurological: Negative for dizziness, light-headedness, numbness and headaches.    History Past Medical History  Diagnosis Date  . Depression   . Thyroid disease   . Bipolar 1 disorder ([place])   . Migraines     with aura  . Diabetes mellitus without complication ([place])   . OSA (obstructive sleep apnea) 10/17/2013    She has past surgical history that includes Wisdom tooth extraction; Nasal septum surgery; Intrauterine device (iud) insertion (5/13); and Other surgical history.   Her family history includes Alcohol abuse in her paternal grandfather; Aneurysm in her paternal grandfather; COPD in her maternal grandmother; Cancer in her maternal aunt, maternal grandfather, maternal grandmother, and maternal uncle; Cancer (age of onset: 5) in her mother; Depression in her cousin, maternal grandmother, and sister; Diabetes in her father and paternal grandmother; Heart disease in her father; Hyperlipidemia in her father; Hypertension in her father and maternal grandmother; Hypothyroidism in her mother and sister; Migraines in her father; Stroke in her maternal grandmother and paternal grandmother.She reports that she has been smoking Cigarettes.  She has a 20 pack-year  smoking history. She has never used smokeless tobacco. She reports that she drinks alcohol. She reports that she does not use illicit drugs.  Current Outpatient Prescriptions on File Prior to Visit  Medication Sig Dispense Refill  . glucose blood test strip Use as instructed daily, one touch verio flex 100 each 12  . IBUPROFEN PO Take by mouth as needed.    . Lancets (ONETOUCH ULTRASOFT) lancets Use as instructed 100 each 12  . levonorgestrel (MIRENA) 20 MCG/24HR IUD 1 Intra Uterine Device (1 each total) by Intrauterine route once. 1 each 0   No current facility-administered medications on file prior to visit.     Objective:  Objective Physical Exam  Constitutional: She is oriented to person, place, and time. She appears well-developed and well-nourished.  HENT:  Head: Normocephalic and atraumatic.  Eyes: Conjunctivae and EOM are normal.  Neck: Normal range of motion. Neck supple. No JVD present. Carotid bruit is not present. No thyromegaly present.  Cardiovascular: Normal rate, regular rhythm and normal heart sounds.   No murmur heard. Pulmonary/Chest: Effort normal and breath sounds normal. No respiratory distress. She has no wheezes. She has no rales. She exhibits no tenderness.  Musculoskeletal: She exhibits no edema.  Neurological: She is alert and oriented to person, place, and time.  Psychiatric: She has a normal mood and affect. Her behavior is normal. Judgment and thought content normal.  Nursing note and vitals reviewed.  BP 133/83 mmHg  Pulse 83  Temp(Src) 98.3 F (36.8 C) (Oral)  Ht 5\' 6"  (1.676 m)  Wt 226 lb 6.4 oz (102.694 kg)  BMI 36.56 kg/m2  SpO2 98% Wt Readings from Last 3 Encounters:  10/31/15 225 lb 9.6 oz (102.331 kg)  10/30/15  226 lb 6.4 oz (102.694 kg)  08/01/15 232 lb (105.235 kg)     Lab Results  Component Value Date   WBC 8.3 09/28/2013   HGB 13.6 09/28/2013   HCT 40.1 09/28/2013   PLT 257.0 09/28/2013   GLUCOSE 108* 08/01/2015   CHOL 345*  08/01/2015   TRIG * 08/01/2015    592.0 Triglyceride is over 400; calculations on Lipids are invalid.   HDL 42.50 08/01/2015   LDLDIRECT 193.0 08/01/2015   LDLCALC 206* 09/28/2013   ALT 35 08/01/2015   AST 36 08/01/2015   NA 138 08/01/2015   K 3.6 08/01/2015   CL 100 08/01/2015   CREATININE 1.03 08/01/2015   BUN 16 08/01/2015   CO2 31 08/01/2015   TSH 66.72* 08/01/2015   HGBA1C 6.8* 08/01/2015   MICROALBUR 5.5* 08/01/2015    US Soft Tissue Head/neck  08/01/2015  CLINICAL DATA:  Thyromegaly and hypothyroidism for many years per patient. Pt states in the last few months she has had difficultly swallowing and eating and feeling of fullness in neck area. Pt getting ready to restart meds for thyroid. EXAM: THYROID ULTRASOUND TECHNIQUE: Ultrasound examination of the thyroid gland and adjacent soft tissues was performed. COMPARISON:  None. FINDINGS: Right thyroid lobe Measurements: 43 x 19 x 19 mm. Coarse nodular heterogeneous echotexture with mild hyperemia. No focal lesion. Left thyroid lobe Measurements: 48 x 22 x 21 mm. Heterogeneous background echotexture with a single small 6 x 5 x 4 mm echogenic nodule, superior pole. Isthmus Thickness: 6 mm.  No nodules visualized. Lymphadenopathy None visualized. IMPRESSION: 1. Normal-sized thyroid with heterogeneous echotexture, and a single 6 mm left nodule. Findings do not meet current consensus criteria for biopsy. Follow-up by clinical exam is recommended. If patient has known risk factors for thyroid carcinoma, consider follow-up ultrasound in 12 months. If patient is clinically hyperthyroid, consider nuclear medicine thyroid uptake and scan. This recommendation follows the consensus statement: Management of Thyroid Nodules Detected as [place]: Society of Radiologists in [place]. Radiology 2005; [id number]. Electronically Signed   By: Lucrezia Europe M.D.   On: 08/01/2015 12:37     Assessment & Plan:  Plan I have discontinued  Cheryl [name]'s lamoTRIgine and levothyroxine. I have also changed her sertraline, clonazePAM, and levothyroxine. Additionally, I am having her maintain her levonorgestrel, IBUPROFEN PO, glucose blood, onetouch ultrasoft, and simvastatin.  Meds ordered this encounter  Medications  . sertraline (ZOLOFT) 100 MG tablet    Sig: Take 1 tablet (100 mg total) by mouth daily.    Dispense:  30 tablet    Refill:  2  . clonazePAM (KLONOPIN) 0.5 MG tablet    Sig: Take 1 tablet (0.5 mg total) by mouth 2 (two) times daily as needed for anxiety.    Dispense:  30 tablet  . DISCONTD: lamoTRIgine (LAMICTAL) 100 MG tablet    Sig: Take 1 tablet in the AM, 1 tablet in the PM    Dispense:  60 tablet    Refill:  0  . simvastatin (ZOCOR) 10 MG tablet    Sig: Take 1 tablet (10 mg total) by mouth daily at 6 PM.    Dispense:  90 tablet    Refill:  0  . DISCONTD: levothyroxine (SYNTHROID, LEVOTHROID) 137 MCG tablet    Sig: Take 1 tablet (137 mcg total) by mouth daily before breakfast. Labs are due now    Dispense:  30 tablet    Refill:  0  . levothyroxine (SYNTHROID, LEVOTHROID) 137 MCG  tablet    Sig: 1 po qd    Dispense:  30 tablet    Refill:  5    Problem List Items Addressed This Visit    None    Visit Diagnoses    Controlled type 2 diabetes mellitus with complication, without long-term current use of insulin (HCC)    -  Primary    Relevant Medications    simvastatin (ZOCOR) 10 MG tablet    Other Relevant Orders    Comprehensive metabolic panel    CBC with Differential/Platelet    Hemoglobin A1c    Lipid panel    Microalbumin / creatinine urine ratio    POCT urinalysis dipstick    TSH    Hypothyroidism, unspecified hypothyroidism type        Relevant Medications    levothyroxine (SYNTHROID, LEVOTHROID) 137 MCG tablet    Other Relevant Orders    Comprehensive metabolic panel    CBC with Differential/Platelet    Hemoglobin A1c    Lipid panel    Microalbumin / creatinine urine ratio    POCT  urinalysis dipstick    TSH    Type 2 diabetes mellitus with complication, without long-term current use of insulin (HCC)        Relevant Medications    simvastatin (ZOCOR) 10 MG tablet    Depression        Relevant Medications    sertraline (ZOLOFT) 100 MG tablet    Generalized anxiety disorder        Relevant Medications    clonazePAM (KLONOPIN) 0.5 MG tablet    Bipolar 2 disorder, major depressive episode (HCC)        Relevant Medications    sertraline (ZOLOFT) 100 MG tablet    clonazePAM (KLONOPIN) 0.5 MG tablet    Hyperlipidemia        Relevant Medications    simvastatin (ZOCOR) 10 MG tablet    Other Relevant Orders    Comprehensive metabolic panel    CBC with Differential/Platelet    Hemoglobin A1c    Lipid panel    Microalbumin / creatinine urine ratio    POCT urinalysis dipstick    TSH       Follow-up: Return in about 6 months (around 05/01/2016), or if symptoms worsen or fail to improve, for annual exam, fasting.  Ann Held, DO

## 2015-10-30 NOTE — Patient Instructions (Addendum)

## 2015-10-31 ENCOUNTER — Encounter (HOSPITAL_COMMUNITY): Payer: Self-pay | Admitting: Psychiatry

## 2015-10-31 ENCOUNTER — Ambulatory Visit (INDEPENDENT_AMBULATORY_CARE_PROVIDER_SITE_OTHER): Payer: 59 | Admitting: Psychiatry

## 2015-10-31 VITALS — BP 126/74 | HR 78 | Ht 66.0 in | Wt 225.6 lb

## 2015-10-31 DIAGNOSIS — F3181 Bipolar II disorder: Secondary | ICD-10-CM

## 2015-10-31 MED ORDER — LAMOTRIGINE 150 MG PO TABS: 150.0000 mg | ORAL_TABLET | Freq: Two times a day (BID) | ORAL | Status: DC

## 2015-10-31 NOTE — Progress Notes (Signed)
[facility] Initial Assessment Note  Cheryl [name] [id number] 39 y.o.  10/31/2015 10:22 AM  Chief Complaint:  I am not happy with my psychiatrist.  I like to change my physician.  I don't get my medication on time.  History of Present Illness:  Cheryl [name] is 41 year old Caucasian, single, employed female who is self-referred for the management of her mental illness.  Agent has depression and bipolar disorder.  She is seeing Jobie Quaker at [facility] for more than 3 years but lately she is not happy with the services.  She do not get phone calls and refills on time and she like to try a new psychiatrist.  She is taking Zoloft, Lamictal and Klonopin as needed.  She mentioned her medicines are doing very well and she has no concern.  She endorse history of depression in her college when she is studying at [employer] [facility] and few years later she had episodes of mania with irritability, mood swing, anger and diagnosed with bipolar disorder.  For past few years she is taking Lamictal and Zoloft which is working very well for her.  She still have episodes of insomnia and irritability but overall she described her symptoms are well controlled.  She admitted history of noncompliance with medication when she felt much better but realized that she needed to take the medication to avoid relapse.  Patient is tolerating her medication and reported no side effects.  She has no rash or itching.  Patient denies any hallucination, delusions, panic attacks, OCD or any PTSD symptoms.  She lives by herself.  She had a steady boyfriend for more than 20 years and her relationship is very good.  She is very close to her mother and her sister.  Patient denies any feeling of hopelessness or worthlessness.  She denies any anhedonia, impulsive behavior or any self abusive behavior.  Currently she is seeing therapist Celesta Gentile at [place] [facility] and she like to continue counseling there.  Patient denies  drinking alcohol or using any illegal substances.  Her appetite is okay.  Her vitals are stable.  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  Patient describes her last mania was 1 year ago when she noticed extreme irritability, anger, mood swing and having passive and fleeting suicidal thoughts.  She admitted not taking the medication at the time.  Patient is very comfortable with her medication and like to continue it.  She takes rarely Klonopin 0.25 as needed every 3-4 weeks.  Suicidal Ideation: No Plan Formed: No Patient has means to carry out plan: No  Homicidal Ideation: No Plan Formed: No Patient has means to carry out plan: No  Past Psychiatric History/Hospitalization(s): Patient reported having depression when she was studying at [employer] state school.  She took Prozac and Effexor in the past.  She did not like Prozac and she had withdrawals from Effexor.  She was diagnosed bipolar disorder few years ago when she remember having mania , mood swing and anger issues.  Since then she is taking Lamictal .  Patient denies any history of psychiatric inpatient treatment, suicidal attempt but admitted history of fleeting and suicidal thoughts .  Patient denies any self abusive behavior.  She was seeing Jobie Quaker at [facility] but not happy with the services.  Anxiety: Yes Bipolar Disorder: Yes Depression: Yes Mania: Yes Psychosis: No Schizophrenia: No Personality Disorder: No Hospitalization for psychiatric illness: No History of  Electroconvulsive Shock Therapy: No Prior Suicide Attempts: No  Family History; Patient endorsed multiple family member has mental illness but they are not diagnosed.  Medical History; Patient is obese and had prediabetes, hyperlipidemia and hypothyroidism.  She see Dr. Hildred Laser.  Patient denies any history of seizures.  Traumatic brain injury: Patient denies any history of traumatic brain  injury.  Education and Work History; Patient has college education.  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  Psychosocial History; Patient born and raised in [place].  She was raised by her biological parents.  Patient is very close to her mother and her sister.  She never married.  She has no children.  She has a steady boyfriend for more than 20 years.  Patient lives by herself.  Legal History; Patient denies any legal issues.  History Of Abuse; Patient denies any history of abuse.  Substance Abuse History; Patient denies any history of drinking alcohol or using any illegal substance use.  Review of Systems: Psychiatric: Agitation: No Hallucination: No Depressed Mood: No Insomnia: Yes Hypersomnia: No Altered Concentration: No Feels Worthless: No Grandiose Ideas: No Belief In Special Powers: No New/Increased Substance Abuse: No Compulsions: No  Neurologic: Headache: No Seizure: No Paresthesias: No   Outpatient Encounter Prescriptions as of 10/31/2015  Medication Sig  . clonazePAM (KLONOPIN) 0.5 MG tablet Take 1 tablet (0.5 mg total) by mouth 2 (two) times daily as needed for anxiety.  Marland Kitchen glucose blood test strip Use as instructed daily, one touch verio flex  . IBUPROFEN PO Take by mouth as needed.  . lamoTRIgine (LAMICTAL) 150 MG tablet Take 1 tablet (150 mg total) by mouth 2 (two) times daily. Take 1 tablet in the AM, 1 tablet in the PM  . Lancets (ONETOUCH ULTRASOFT) lancets Use as instructed  . levonorgestrel (MIRENA) 20 MCG/24HR IUD 1 Intra Uterine Device (1 each total) by Intrauterine route once.  Marland Kitchen levothyroxine (SYNTHROID, LEVOTHROID) 137 MCG tablet 1 po qd  . sertraline (ZOLOFT) 100 MG tablet Take 1 tablet (100 mg total) by mouth daily.  . simvastatin (ZOCOR) 10 MG tablet Take 1 tablet (10 mg total) by mouth daily at 6 PM.  . [DISCONTINUED] lamoTRIgine (LAMICTAL) 100 MG tablet Take 1 tablet in the  AM, 1 tablet in the PM   No facility-administered encounter medications on file as of 10/31/2015.    No results found for this or any previous visit (from the past 2160 hour(s)).    Constitutional:  BP 126/74 mmHg  Pulse 78  Ht 5\' 6"  (1.676 m)  Wt 225 lb 9.6 oz (102.331 kg)  BMI 36.43 kg/m2   Musculoskeletal: Strength & Muscle Tone: within normal limits Gait & Station: normal Patient leans: N/A  Psychiatric Specialty Exam: General Appearance: Casual and Fairly Groomed  Engineer, water::  Fair  Speech:  Normal Rate  Volume:  Normal  Mood:  Anxious  Affect:  Appropriate  Thought Process:  Circumstantial  Orientation:  Full (Time, Place, and Person)  Thought Content:  Rumination  Suicidal Thoughts:  No  Homicidal Thoughts:  No  Memory:  Immediate;   Fair Recent;   Fair Remote;   Good  Judgement:  Fair  Insight:  Good  Psychomotor Activity:  Normal  Concentration:  Fair  Recall:  [place] of Knowledge:  Good  Language:  Good  Akathisia:  No  Handed:  Right  AIMS (if indicated):     Assets:  Communication Skills Desire for Improvement Financial Resources/Insurance Housing Physical Health Social Support Transportation  ADL's:  Intact  Cognition:  WNL  Sleep:        Established Problem, Stable/Improving (1), New problem, with additional work up planned, Review of Psycho-Social Stressors (1), Review or order clinical lab tests (1), Decision to obtain old records (1), Review and summation of old records (2), Review of Medication Regimen & Side Effects (2) and Review of New Medication or Change in Dosage (2)  Assessment: Axis I: Bipolar disorder type I.  Rule out.  Depressive disorder recurrent mild.  Axis II: Deferred  Axis III:  Past Medical History  Diagnosis Date  . Depression   . Thyroid disease   . Bipolar 1 disorder ([place])   . Migraines     with aura  . Diabetes mellitus without complication ([place])   . OSA (obstructive sleep apnea) 10/17/2013      Plan:  I review her symptoms, history, collateral information, recent blood work results and her current medication.  She is taking 150 mg twice a day Lamictal and Zoloft 100 mg daily.  She is also prescribed Klonopin 0.5 mg which she takes half tablet only as needed.  Patient is a stable on her current psychiatric medication.  She does not want to change the dose.  Discussed medication side effects especially rash that may occur with the Lamictal and in that case she needed to stop the medication immediately.  Patient will continue her counseling with Celesta Gentile at [facility].  We will get records from her previous psychiatrist Trish Fountain.  Recommended to call us back if she has any question or any concern.  I will see her again in 3-4 weeks.  Discuss safety plan that anytime having active suicidal thoughts or homicidal thoughts then she need to call 911 or go to the local emergency room.  ARFEEN,SYED T., MD 10/31/2015

## 2015-11-01 ENCOUNTER — Encounter: Payer: Self-pay | Admitting: Family Medicine

## 2015-11-06 ENCOUNTER — Other Ambulatory Visit: Payer: 59

## 2015-11-19 ENCOUNTER — Other Ambulatory Visit (INDEPENDENT_AMBULATORY_CARE_PROVIDER_SITE_OTHER): Payer: 59

## 2015-11-19 DIAGNOSIS — E039 Hypothyroidism, unspecified: Secondary | ICD-10-CM | POA: Diagnosis not present

## 2015-11-19 DIAGNOSIS — E118 Type 2 diabetes mellitus with unspecified complications: Secondary | ICD-10-CM

## 2015-11-19 DIAGNOSIS — E785 Hyperlipidemia, unspecified: Secondary | ICD-10-CM

## 2015-11-19 LAB — CBC WITH DIFFERENTIAL/PLATELET
Basophils Absolute: 0.1 10*3/uL (ref 0.0–0.1)
Basophils Relative: 0.7 % (ref 0.0–3.0)
EOS PCT: 2 % (ref 0.0–5.0)
Eosinophils Absolute: 0.2 10*3/uL (ref 0.0–0.7)
HEMATOCRIT: 38.9 % (ref 36.0–46.0)
Hemoglobin: 13.1 g/dL (ref 12.0–15.0)
LYMPHS ABS: 2.9 10*3/uL (ref 0.7–4.0)
Lymphocytes Relative: 29.4 % (ref 12.0–46.0)
MCHC: 33.6 g/dL (ref 30.0–36.0)
MCV: 92.3 fl (ref 78.0–100.0)
MONOS PCT: 5.6 % (ref 3.0–12.0)
Monocytes Absolute: 0.6 10*3/uL (ref 0.1–1.0)
NEUTROS ABS: 6.2 10*3/uL (ref 1.4–7.7)
NEUTROS PCT: 62.3 % (ref 43.0–77.0)
PLATELETS: 248 10*3/uL (ref 150.0–400.0)
RBC: 4.22 Mil/uL (ref 3.87–5.11)
RDW: 13 % (ref 11.5–15.5)
WBC: 10 10*3/uL (ref 4.0–10.5)

## 2015-11-19 LAB — LIPID PANEL
Cholesterol: 228 mg/dL — ABNORMAL HIGH (ref 0–200)
HDL: 42 mg/dL (ref >39.00)
NONHDL: 186.49
Total CHOL/HDL Ratio: 5
Triglycerides: 287 mg/dL — ABNORMAL HIGH (ref 0.0–149.0)
VLDL: 57.4 mg/dL — AB (ref 0.0–40.0)

## 2015-11-19 LAB — COMPREHENSIVE METABOLIC PANEL
ALT: 31 U/L (ref 0–35)
AST: 28 U/L (ref 0–37)
Albumin: 4.5 g/dL (ref 3.5–5.2)
Alkaline Phosphatase: 74 U/L (ref 39–117)
BUN: 15 mg/dL (ref 6–23)
CO2: 29 meq/L (ref 19–32)
Calcium: 9.3 mg/dL (ref 8.4–10.5)
Chloride: 102 mEq/L (ref 96–112)
Creatinine, Ser: 0.78 mg/dL (ref 0.40–1.20)
GFR: 86.96 mL/min (ref >60.00)
GLUCOSE: 113 mg/dL — AB (ref 70–99)
POTASSIUM: 3.6 meq/L (ref 3.5–5.1)
Sodium: 137 mEq/L (ref 135–145)
Total Bilirubin: 0.5 mg/dL (ref 0.2–1.2)
Total Protein: 7.1 g/dL (ref 6.0–8.3)

## 2015-11-19 LAB — TSH: TSH: 55.22 u[IU]/mL — AB (ref 0.35–4.50)

## 2015-11-19 LAB — LDL CHOLESTEROL, DIRECT: Direct LDL: 136 mg/dL

## 2015-11-19 LAB — HEMOGLOBIN A1C: Hgb A1c MFr Bld: 7 % — ABNORMAL HIGH (ref 4.6–6.5)

## 2015-11-20 ENCOUNTER — Encounter (HOSPITAL_COMMUNITY): Payer: Self-pay | Admitting: Psychiatry

## 2015-11-20 ENCOUNTER — Ambulatory Visit (INDEPENDENT_AMBULATORY_CARE_PROVIDER_SITE_OTHER): Payer: 59 | Admitting: Psychiatry

## 2015-11-20 VITALS — BP 120/80 | HR 78 | Ht 66.0 in | Wt 226.8 lb

## 2015-11-20 DIAGNOSIS — F329 Major depressive disorder, single episode, unspecified: Secondary | ICD-10-CM

## 2015-11-20 DIAGNOSIS — F32A Depression, unspecified: Secondary | ICD-10-CM

## 2015-11-20 DIAGNOSIS — F3181 Bipolar II disorder: Secondary | ICD-10-CM | POA: Diagnosis not present

## 2015-11-20 MED ORDER — LAMOTRIGINE 150 MG PO TABS: 150.0000 mg | ORAL_TABLET | Freq: Two times a day (BID) | ORAL | Status: DC

## 2015-11-20 MED ORDER — SERTRALINE HCL 100 MG PO TABS: 100.0000 mg | ORAL_TABLET | Freq: Every day | ORAL | Status: DC

## 2015-11-20 NOTE — Progress Notes (Signed)
[place] Progress Note  Cheryl LAABS [id number] 40 y.o.  11/20/2015 4:15 PM  Chief Complaint:  I missed a few doses of Lamictal and I was very manic.    History of Present Illness:  Cheryl [name] is 40 year old Caucasian, single, employed female who was seen 3 weeks ago as initial evaluation .  She was self-referred because she was not happy with her previous provider.  She was seeing Jobie Quaker at [facility] for more than 3 years but lately she is not happy with the services.  Patient has history of bipolar disorder.  She is taking Lamictal, Zoloft and reported no side effects.  We are still awaiting records from her previous psychiatrist.  Patient told she is doing better but lately she missed a few doses of Lamictal and became very manic.  She was calling all her friends and having a lot of energy and poor sleep.  She was doing craft work for her best friend who is retiring.  Her sister noticed that she was somewhat manic and reminded to take her medication.  Patient restarted Lamictal and she is feeling much better.  Her sleep is improved.  She has no rash or itching.  She denies any paranoia or any hallucination.  Her energy level is good.  Recently she's seen her primary care physician and she has blood work.  Her cholesterol, triglyceride is improved from the past.  Her hemoglobin and see is 7.  She is trying to lose weight.  Patient denies any feeling of hopelessness or worthlessness.  She denies any crying spells or any paranoia.  She wants to continue Zoloft and Lamictal.  She rarely takes Klonopin which was prescribed by her primary care physician.  Patient denies drinking alcohol or using any illegal substances.  She lives by herself.  She never married and she has no children.  She is working at a [employer], technical sales in customer care services.  She has a close contact with her sister and mother who are supportive.   Suicidal Ideation: No Plan Formed: No Patient  has means to carry out plan: No  Homicidal Ideation: No Plan Formed: No Patient has means to carry out plan: No  Past Psychiatric History/Hospitalization(s): Patient reported having depression when she was studying at [employer].  She took Prozac and Effexor in the past.  She did not like Prozac and she had withdrawals from Effexor.  She was diagnosed bipolar disorder few years ago when she remember having mania , mood swing and anger issues.  Since then she is taking Lamictal .  Patient denies any history of psychiatric inpatient treatment, suicidal attempt but admitted history of fleeting and suicidal thoughts .  Patient denies any self abusive behavior.  She was seeing Jobie Quaker at [facility] but not happy with the services.  Anxiety: Yes Bipolar Disorder: Yes Depression: Yes Mania: Yes Psychosis: No Schizophrenia: No Personality Disorder: No Hospitalization for psychiatric illness: No History of Electroconvulsive Shock Therapy: No Prior Suicide Attempts: No  Family History; Patient endorsed multiple family member has mental illness but they are not diagnosed.  Medical History; Patient is obese and had prediabetes, hyperlipidemia and hypothyroidism.  She see Dr. Hildred Laser.  Patient denies any history of seizures.  Review of Systems: Psychiatric: Agitation: No Hallucination: No Depressed Mood: No Insomnia: Yes Hypersomnia: No Altered Concentration: No Feels Worthless: No Grandiose Ideas: No Belief In Special Powers: No New/Increased Substance Abuse: No Compulsions: No  Neurologic: Headache: No Seizure: No  Paresthesias: No   Outpatient Encounter Prescriptions as of 11/20/2015  Medication Sig  . clonazePAM (KLONOPIN) 0.5 MG tablet Take 1 tablet (0.5 mg total) by mouth 2 (two) times daily as needed for anxiety.  Marland Kitchen glucose blood test strip Use as instructed daily, one touch verio flex  . IBUPROFEN PO Take by mouth as needed.  . lamoTRIgine (LAMICTAL) 150  MG tablet Take 1 tablet (150 mg total) by mouth 2 (two) times daily. Take 1 tablet in the AM, 1 tablet in the PM  . Lancets (ONETOUCH ULTRASOFT) lancets Use as instructed  . levonorgestrel (MIRENA) 20 MCG/24HR IUD 1 Intra Uterine Device (1 each total) by Intrauterine route once.  Marland Kitchen levothyroxine (SYNTHROID, LEVOTHROID) 137 MCG tablet 1 po qd  . sertraline (ZOLOFT) 100 MG tablet Take 1 tablet (100 mg total) by mouth daily.  . simvastatin (ZOCOR) 10 MG tablet Take 1 tablet (10 mg total) by mouth daily at 6 PM.  . [DISCONTINUED] lamoTRIgine (LAMICTAL) 150 MG tablet Take 1 tablet (150 mg total) by mouth 2 (two) times daily. Take 1 tablet in the AM, 1 tablet in the PM  . [DISCONTINUED] sertraline (ZOLOFT) 100 MG tablet Take 1 tablet (100 mg total) by mouth daily.   No facility-administered encounter medications on file as of 11/20/2015.    Recent Results (from the past 2160 hour(s))  Comprehensive metabolic panel     Status: Abnormal   Collection Time: 11/19/15  7:56 AM  Result Value Ref Range   Sodium 137 135 - 145 mEq/L   Potassium 3.6 3.5 - 5.1 mEq/L   Chloride 102 96 - 112 mEq/L   CO2 29 19 - 32 mEq/L   Glucose, Bld 113 (H) 70 - 99 mg/dL   BUN 15 6 - 23 mg/dL   Creatinine, Ser 0.78 0.40 - 1.20 mg/dL   Total Bilirubin 0.5 0.2 - 1.2 mg/dL   Alkaline Phosphatase 74 39 - 117 U/L   AST 28 0 - 37 U/L   ALT 31 0 - 35 U/L   Total Protein 7.1 6.0 - 8.3 g/dL   Albumin 4.5 3.5 - 5.2 g/dL   Calcium 9.3 8.4 - 10.5 mg/dL   GFR 86.96 >60.00 mL/min  CBC with Differential/Platelet     Status: None   Collection Time: 11/19/15  7:56 AM  Result Value Ref Range   WBC 10.0 4.0 - 10.5 K/uL   RBC 4.22 3.87 - 5.11 Mil/uL   Hemoglobin 13.1 12.0 - 15.0 g/dL   HCT 38.9 36.0 - 46.0 %   MCV 92.3 78.0 - 100.0 fl   MCHC 33.6 30.0 - 36.0 g/dL   RDW 13.0 11.5 - 15.5 %   Platelets 248.0 150.0 - 400.0 K/uL   Neutrophils Relative % 62.3 43.0 - 77.0 %   Lymphocytes Relative 29.4 12.0 - 46.0 %   Monocytes Relative  5.6 3.0 - 12.0 %   Eosinophils Relative 2.0 0.0 - 5.0 %   Basophils Relative 0.7 0.0 - 3.0 %   Neutro Abs 6.2 1.4 - 7.7 K/uL   Lymphs Abs 2.9 0.7 - 4.0 K/uL   Monocytes Absolute 0.6 0.1 - 1.0 K/uL   Eosinophils Absolute 0.2 0.0 - 0.7 K/uL   Basophils Absolute 0.1 0.0 - 0.1 K/uL  Hemoglobin A1c     Status: Abnormal   Collection Time: 11/19/15  7:56 AM  Result Value Ref Range   Hgb A1c MFr Bld 7.0 (H) 4.6 - 6.5 %    Comment: Glycemic Control Guidelines for  People with Diabetes:Non Diabetic:  <6%Goal of Therapy: <7%Additional Action Suggested:  >8%   Lipid panel     Status: Abnormal   Collection Time: 11/19/15  7:56 AM  Result Value Ref Range   Cholesterol 228 (H) 0 - 200 mg/dL    Comment: ATP III Classification       Desirable:  < 200 mg/dL               Borderline High:  200 - 239 mg/dL          High:  > = 240 mg/dL   Triglycerides 287.0 (H) 0.0 - 149.0 mg/dL    Comment: Normal:  <150 mg/dLBorderline High:  150 - 199 mg/dL   HDL 42.00 >39.00 mg/dL   VLDL 57.4 (H) 0.0 - 40.0 mg/dL   Total CHOL/HDL Ratio 5     Comment:                Men          Women1/2 Average Risk     3.4          3.3Average Risk          5.0          4.42X Average Risk          9.6          7.13X Average Risk          15.0          11.0                       NonHDL 186.49     Comment: NOTE:  Non-HDL goal should be 30 mg/dL higher than patient's LDL goal (i.e. LDL goal of < 70 mg/dL, would have non-HDL goal of < 100 mg/dL)  TSH     Status: Abnormal   Collection Time: 11/19/15  7:56 AM  Result Value Ref Range   TSH 55.22 (H) 0.35 - 4.50 uIU/mL  LDL cholesterol, direct     Status: None   Collection Time: 11/19/15  7:56 AM  Result Value Ref Range   Direct LDL 136.0 mg/dL    Comment: Optimal:  <100 mg/dLNear or Above Optimal:  100-129 mg/dLBorderline High:  130-159 mg/dLHigh:  160-189 mg/dLVery High:  >190 mg/dL      Constitutional:  BP 120/80 mmHg  Pulse 78  Ht 5\' 6"  (1.676 m)  Wt 226 lb 12.8 oz (102.876 kg)   BMI 36.62 kg/m2   Musculoskeletal: Strength & Muscle Tone: within normal limits Gait & Station: normal Patient leans: N/A  Psychiatric Specialty Exam: General Appearance: Casual and Fairly Groomed  Engineer, water::  Fair  Speech:  Normal Rate  Volume:  Normal  Mood:  Anxious  Affect:  Appropriate  Thought Process:  Circumstantial  Orientation:  Full (Time, Place, and Person)  Thought Content:  Rumination  Suicidal Thoughts:  No  Homicidal Thoughts:  No  Memory:  Immediate;   Fair Recent;   Fair Remote;   Good  Judgement:  Fair  Insight:  Good  Psychomotor Activity:  Normal  Concentration:  Fair  Recall:  [place] of Knowledge:  Good  Language:  Good  Akathisia:  No  Handed:  Right  AIMS (if indicated):     Assets:  Communication Skills Desire for Improvement Financial Resources/Insurance Housing Physical Health Social Support Transportation  ADL's:  Intact  Cognition:  WNL  Sleep:  Established Problem, Stable/Improving (1), Review of Psycho-Social Stressors (1), Review or order clinical lab tests (1), Review and summation of old records (2), Review of Last Therapy Session (1) and Review of Medication Regimen & Side Effects (2)  Assessment: Axis I: Bipolar disorder type I.  Rule out.  Depressive disorder recurrent mild.  Axis II: Deferred  Axis III:  Past Medical History  Diagnosis Date  . Depression   . Thyroid disease   . Bipolar 1 disorder ([place])   . Migraines     with aura  . Diabetes mellitus without complication ([place])   . OSA (obstructive sleep apnea) 10/17/2013     Plan:  I reviewed records from her primary care physician, recent blood work results and her current medication.  Patient is doing better on her current psychiatric medication.  She has no rash or itching.  I will continue Lamictal 300 mg daily and Zoloft 100 mg daily.  Reminded medication compliance to avoid relapses.  Patient is seeing Mickeal Skinner for counseling.  We are still  awaiting collateral information from her previous therapist.  Discussed medication side effects and benefits.  Recommended to call us back if she has any question or any concern.  Follow-up in 3 months. Discuss safety plan that anytime having active suicidal thoughts or homicidal thoughts then she need to call 911 or go to the local emergency room.  Gelene Recktenwald T., MD 11/20/2015

## 2015-11-26 ENCOUNTER — Other Ambulatory Visit: Payer: Self-pay

## 2015-11-26 DIAGNOSIS — E039 Hypothyroidism, unspecified: Secondary | ICD-10-CM

## 2015-11-26 MED ORDER — LEVOTHYROXINE SODIUM 150 MCG PO TABS: ORAL_TABLET | ORAL | Status: DC

## 2015-11-26 MED ORDER — ATORVASTATIN CALCIUM 20 MG PO TABS: 20.0000 mg | ORAL_TABLET | Freq: Every day | ORAL | Status: DC

## 2015-11-26 MED ORDER — METFORMIN HCL ER 500 MG PO TB24: 500.0000 mg | ORAL_TABLET | Freq: Every evening | ORAL | Status: DC

## 2015-12-05 ENCOUNTER — Encounter: Payer: Self-pay | Admitting: Family Medicine

## 2015-12-05 NOTE — Telephone Encounter (Signed)
Labs in 3 months.

## 2015-12-08 ENCOUNTER — Other Ambulatory Visit: Payer: Self-pay

## 2015-12-25 ENCOUNTER — Encounter: Payer: Self-pay | Admitting: Family Medicine

## 2015-12-25 DIAGNOSIS — E118 Type 2 diabetes mellitus with unspecified complications: Secondary | ICD-10-CM

## 2015-12-25 NOTE — Telephone Encounter (Signed)
Per Dr. Charlett Blake: I am happy to reauthorize if you know what class she has been to. If it is through [facility] can you place the referral? Or do you need me to?   Chart reviewed, Pt previously seen by Jeanie Sewer, RN in 2014. Referral placed.

## 2015-12-25 NOTE — Telephone Encounter (Signed)
The class was in 2014. Please advise if it ok to do a new referral. [id number].   KP

## 2016-01-16 ENCOUNTER — Encounter: Payer: Self-pay | Admitting: Family Medicine

## 2016-01-16 DIAGNOSIS — E1151 Type 2 diabetes mellitus with diabetic peripheral angiopathy without gangrene: Secondary | ICD-10-CM

## 2016-01-16 MED ORDER — GLUCOSE BLOOD VI STRP: ORAL_STRIP | 12 refills | Status: DC

## 2016-01-23 ENCOUNTER — Encounter: Payer: Self-pay | Admitting: *Deleted

## 2016-01-23 ENCOUNTER — Encounter: Payer: 59 | Attending: Family Medicine | Admitting: *Deleted

## 2016-01-23 DIAGNOSIS — E119 Type 2 diabetes mellitus without complications: Secondary | ICD-10-CM

## 2016-01-23 DIAGNOSIS — Z713 Dietary counseling and surveillance: Secondary | ICD-10-CM | POA: Insufficient documentation

## 2016-01-23 DIAGNOSIS — E118 Type 2 diabetes mellitus with unspecified complications: Secondary | ICD-10-CM | POA: Insufficient documentation

## 2016-01-23 NOTE — Progress Notes (Signed)
Diabetes Self-Management Education  Visit Type: First/Initial  Appt. Start Time: 0800 Appt. End Time: 0930  01/23/2016  [name], identified by name and date of birth, is a 40 y.o. female with a diagnosis of Diabetes: Type 2.   ASSESSMENT  Height 5\' 6"  (1.676 m), weight 212 lb 14.4 oz (96.6 kg). Body mass index is 34.36 kg/m.      Diabetes Self-Management Education - 01/23/16 0817      Visit Information   Visit Type First/Initial     Initial Visit   Diabetes Type Type 2   Are you currently following a meal plan? Yes   Are you taking your medications as prescribed? Not on Medications   Date Diagnosed a few years ago     Health Coping   How would you rate your overall health? Fair     Psychosocial Assessment   Patient Belief/Attitude about Diabetes Motivated to manage diabetes   Other persons present Patient   Patient Concerns Nutrition/Meal planning   Special Needs None   Preferred Learning Style Hands on   Learning Readiness Change in progress   What is the last grade level you completed in school? college     Pre-Education Assessment   Patient understands the diabetes disease and treatment process. Needs Instruction   Patient understands incorporating nutritional management into lifestyle. Needs Instruction   Patient undertands incorporating physical activity into lifestyle. Needs Instruction   Patient understands using medications safely. Needs Instruction   Patient understands monitoring blood glucose, interpreting and using results Needs Instruction   Patient understands prevention, detection, and treatment of acute complications. Needs Instruction   Patient understands prevention, detection, and treatment of chronic complications. Needs Instruction   Patient understands how to develop strategies to address psychosocial issues. Needs Instruction   Patient understands how to develop strategies to promote health/change behavior. Needs Instruction     Complications   Last HgB A1C per patient/outside source 7 %   How often do you check your blood sugar? 1-2 times/day   Number of hypoglycemic episodes per month 0   Have you had a dilated eye exam in the past 12 months? No   Have you had a dental exam in the past 12 months? No   Are you checking your feet? No     Dietary Intake   Breakfast 2 boiled eggs, [place] sausage or bacon   Snack (morning) not usually unless pretzels or bell pepper and hummus   Lunch brings usually- left overs OR vegetable salad OR microwave meal OR buys a sandwich   Snack (afternoon) none if snacked in the AM OR same as AM snack foods , likes crunchy foods   Dinner lean meat, vegetables, small serving starch OR    Beverage(s) coffee with hot chocolate (no sugar added), diet soda, crystal light, water     Exercise   Exercise Type Light (walking / raking leaves)   How many days per week to you exercise? 1   How many minutes per day do you exercise? 60   Total minutes per week of exercise 60     Patient Education   Previous Diabetes Education No   Disease state  Definition of diabetes, type 1 and 2, and the diagnosis of diabetes   Nutrition management  Role of diet in the treatment of diabetes and the relationship between the three main macronutrients and blood glucose level;Food label reading, portion sizes and measuring food.;Carbohydrate counting   Physical activity and exercise  Role  of exercise on diabetes management, blood pressure control and cardiac health.   Medications --  NA   Monitoring Purpose and frequency of SMBG.;Identified appropriate SMBG and/or A1C goals.   Chronic complications Relationship between chronic complications and blood glucose control   Psychosocial adjustment Role of stress on diabetes     Individualized Goals (developed by patient)   Nutrition Follow meal plan discussed   Physical Activity Exercise 3-5 times per week   Medications Not Applicable   Monitoring  test blood  glucose pre and post meals as discussed   Reducing Risk examine blood glucose patterns     Post-Education Assessment   Patient understands the diabetes disease and treatment process. Demonstrates understanding / competency   Patient understands incorporating nutritional management into lifestyle. Demonstrates understanding / competency   Patient undertands incorporating physical activity into lifestyle. Demonstrates understanding / competency   Patient understands using medications safely. Demonstrates understanding / competency   Patient understands monitoring blood glucose, interpreting and using results Demonstrates understanding / competency   Patient understands prevention, detection, and treatment of chronic complications. Demonstrates understanding / competency   Patient understands how to develop strategies to address psychosocial issues. Demonstrates understanding / competency     Outcomes   Expected Outcomes Demonstrated interest in learning. Expect positive outcomes   Future DMSE PRN   Program Status Completed      Individualized Plan for Diabetes Self-Management Training:   Learning Objective:  Patient will have a greater understanding of diabetes self-management. Patient education plan is to attend individual and/or group sessions per assessed needs and concerns.   Plan:   Patient Instructions  Plan:  Aim for 2 Carb Choices per meal (30 grams) +/- 1 either way  Aim for 0-1 Carbs per snack if hungry  Include protein in moderation with your meals and snacks Consider reading food labels for Total Carbohydrate of foods Continue checking BG at alternate times per day        Expected Outcomes:  Demonstrated interest in learning. Expect positive outcomes  Education material provided: Living Well with Diabetes, A1C conversion sheet, Meal plan card and Carbohydrate counting sheet  If problems or questions, patient to contact team via:  Phone and Email  Future DSME  appointment: PRN

## 2016-01-23 NOTE — Patient Instructions (Addendum)
Plan:  Aim for 2 Carb Choices per meal (30 grams) +/- 1 either way  Aim for 0-1 Carbs per snack if hungry  Include protein in moderation with your meals and snacks Consider reading food labels for Total Carbohydrate of foods Continue checking BG at alternate times per day      

## 2016-02-24 ENCOUNTER — Ambulatory Visit (HOSPITAL_COMMUNITY): Payer: Self-pay | Admitting: Psychiatry

## 2016-02-24 ENCOUNTER — Other Ambulatory Visit (HOSPITAL_COMMUNITY): Payer: Self-pay

## 2016-02-24 DIAGNOSIS — F32A Depression, unspecified: Secondary | ICD-10-CM

## 2016-02-24 DIAGNOSIS — F3181 Bipolar II disorder: Secondary | ICD-10-CM

## 2016-02-24 DIAGNOSIS — F329 Major depressive disorder, single episode, unspecified: Secondary | ICD-10-CM

## 2016-02-24 MED ORDER — SERTRALINE HCL 100 MG PO TABS: 100.0000 mg | ORAL_TABLET | Freq: Every day | ORAL | 2 refills | Status: DC

## 2016-02-24 MED ORDER — LAMOTRIGINE 150 MG PO TABS: 150.0000 mg | ORAL_TABLET | Freq: Two times a day (BID) | ORAL | 2 refills | Status: DC

## 2016-03-22 ENCOUNTER — Other Ambulatory Visit: Payer: Self-pay | Admitting: Family Medicine

## 2016-03-22 DIAGNOSIS — Z1231 Encounter for screening mammogram for malignant neoplasm of breast: Secondary | ICD-10-CM

## 2016-03-24 ENCOUNTER — Ambulatory Visit (HOSPITAL_BASED_OUTPATIENT_CLINIC_OR_DEPARTMENT_OTHER)
Admission: RE | Admit: 2016-03-24 | Discharge: 2016-03-24 | Disposition: A | Discharge status: Home / Self Care | Payer: 59 | Source: Ambulatory Visit | Attending: Family Medicine | Admitting: Family Medicine

## 2016-03-24 DIAGNOSIS — Z1231 Encounter for screening mammogram for malignant neoplasm of breast: Secondary | ICD-10-CM | POA: Diagnosis not present

## 2016-03-25 ENCOUNTER — Other Ambulatory Visit: Payer: Self-pay | Admitting: Family Medicine

## 2016-03-25 ENCOUNTER — Other Ambulatory Visit (HOSPITAL_COMMUNITY): Payer: Self-pay | Admitting: Psychiatry

## 2016-03-25 DIAGNOSIS — F3181 Bipolar II disorder: Secondary | ICD-10-CM

## 2016-03-25 DIAGNOSIS — E039 Hypothyroidism, unspecified: Secondary | ICD-10-CM

## 2016-03-25 DIAGNOSIS — F329 Major depressive disorder, single episode, unspecified: Secondary | ICD-10-CM

## 2016-03-25 DIAGNOSIS — F32A Depression, unspecified: Secondary | ICD-10-CM

## 2016-03-26 ENCOUNTER — Other Ambulatory Visit: Payer: Self-pay | Admitting: Family Medicine

## 2016-04-13 ENCOUNTER — Telehealth: Payer: Self-pay | Admitting: Family Medicine

## 2016-04-13 DIAGNOSIS — E785 Hyperlipidemia, unspecified: Secondary | ICD-10-CM

## 2016-04-13 DIAGNOSIS — E118 Type 2 diabetes mellitus with unspecified complications: Secondary | ICD-10-CM

## 2016-04-13 DIAGNOSIS — E039 Hypothyroidism, unspecified: Secondary | ICD-10-CM

## 2016-04-13 MED ORDER — ATORVASTATIN CALCIUM 20 MG PO TABS: 20.0000 mg | ORAL_TABLET | Freq: Every day | ORAL | 0 refills | Status: DC

## 2016-04-13 MED ORDER — LEVOTHYROXINE SODIUM 150 MCG PO TABS: 150.0000 ug | ORAL_TABLET | Freq: Every day | ORAL | 0 refills | Status: DC

## 2016-04-13 NOTE — Telephone Encounter (Signed)
levothyroxine (SYNTHROID, LEVOTHROID) 150 MCG tablet  atorvastatin (LIPITOR) 20 MG tablet   Patient is stating that her prescription bottles say that she is due for labs. She also stated that she may want her A1C check as she has been working on diet and exercise for her diabetes. Please advise.   Phone: [phone]

## 2016-04-13 NOTE — Telephone Encounter (Signed)
Med's faxed and Orders are in. Please schedule a lab visit.     KP

## 2016-04-14 NOTE — Telephone Encounter (Signed)
Patient scheduled and aware that meds were faxed.

## 2016-04-15 ENCOUNTER — Ambulatory Visit (HOSPITAL_COMMUNITY): Payer: Self-pay | Admitting: Psychiatry

## 2016-04-22 ENCOUNTER — Other Ambulatory Visit (INDEPENDENT_AMBULATORY_CARE_PROVIDER_SITE_OTHER): Payer: 59

## 2016-04-22 DIAGNOSIS — E039 Hypothyroidism, unspecified: Secondary | ICD-10-CM

## 2016-04-22 DIAGNOSIS — E118 Type 2 diabetes mellitus with unspecified complications: Secondary | ICD-10-CM

## 2016-04-22 DIAGNOSIS — E785 Hyperlipidemia, unspecified: Secondary | ICD-10-CM | POA: Diagnosis not present

## 2016-04-22 LAB — COMPREHENSIVE METABOLIC PANEL
ALT: 18 U/L (ref 0–35)
AST: 14 U/L (ref 0–37)
Albumin: 4.3 g/dL (ref 3.5–5.2)
Alkaline Phosphatase: 70 U/L (ref 39–117)
BILIRUBIN TOTAL: 0.6 mg/dL (ref 0.2–1.2)
BUN: 14 mg/dL (ref 6–23)
CO2: 29 meq/L (ref 19–32)
CREATININE: 0.79 mg/dL (ref 0.40–1.20)
Calcium: 9.3 mg/dL (ref 8.4–10.5)
Chloride: 105 mEq/L (ref 96–112)
GFR: 85.51 mL/min (ref >60.00)
GLUCOSE: 96 mg/dL (ref 70–99)
Potassium: 3.7 mEq/L (ref 3.5–5.1)
Sodium: 139 mEq/L (ref 135–145)
Total Protein: 7.1 g/dL (ref 6.0–8.3)

## 2016-04-22 LAB — LIPID PANEL
CHOL/HDL RATIO: 3
Cholesterol: 109 mg/dL (ref 0–200)
HDL: 35.5 mg/dL — ABNORMAL LOW (ref >39.00)
LDL Cholesterol: 52 mg/dL (ref 0–99)
NONHDL: 73.83
Triglycerides: 111 mg/dL (ref 0.0–149.0)
VLDL: 22.2 mg/dL (ref 0.0–40.0)

## 2016-04-22 LAB — HEMOGLOBIN A1C: Hgb A1c MFr Bld: 6.2 % (ref 4.6–6.5)

## 2016-04-22 LAB — TSH: TSH: 11.31 u[IU]/mL — ABNORMAL HIGH (ref 0.35–4.50)

## 2016-04-29 ENCOUNTER — Ambulatory Visit (INDEPENDENT_AMBULATORY_CARE_PROVIDER_SITE_OTHER): Payer: 59 | Admitting: Psychiatry

## 2016-04-29 ENCOUNTER — Encounter (HOSPITAL_COMMUNITY): Payer: Self-pay | Admitting: Psychiatry

## 2016-04-29 DIAGNOSIS — F411 Generalized anxiety disorder: Secondary | ICD-10-CM

## 2016-04-29 DIAGNOSIS — F3181 Bipolar II disorder: Secondary | ICD-10-CM | POA: Diagnosis not present

## 2016-04-29 DIAGNOSIS — Z79899 Other long term (current) drug therapy: Secondary | ICD-10-CM

## 2016-04-29 MED ORDER — CLONAZEPAM 0.5 MG PO TABS: 0.5000 mg | ORAL_TABLET | Freq: Every day | ORAL | 0 refills | Status: DC | PRN

## 2016-04-29 MED ORDER — SERTRALINE HCL 100 MG PO TABS: 100.0000 mg | ORAL_TABLET | Freq: Every day | ORAL | 2 refills | Status: DC

## 2016-04-29 MED ORDER — LAMOTRIGINE 150 MG PO TABS: 150.0000 mg | ORAL_TABLET | Freq: Two times a day (BID) | ORAL | 2 refills | Status: DC

## 2016-04-29 NOTE — Progress Notes (Signed)
[facility] Progress Note  LASHAWANDA NORBECK [id number] 40 y.o.  04/29/2016 4:48 PM  Chief Complaint:  Medication management and follow-up.     History of Present Illness:  Cheryl [name] came for her follow-up appointment.  She is compliant with medication.  Most of the time she described her mood stable but there are times when she feels that her attention and concentration is not as good.  She does not want to change medication.  She stopped seeing therapists because she felt she does not need it anymore.  She denies any mania, psychosis, hallucination.  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  In recent months he denies any mania, suicidal thoughts, irritability or any anger.  She is tolerating her medication and reported no side effects.  She has no rash, itching, headaches.  She really takes Klonopin but she is completely out and will require a new prescription.  Patient denies drinking alcohol or using any illegal substances.  We are still awaiting collateral information from her previous psychiatry.  She used to see psychiatrist at [facility].  Patient never married and she has no children.  She has close contact with her sister and mother who are very supportive.  Recently she had blood work and her hemoglobin A1c is 6.2 which is better from 7.  Her TSH is improved from 55.22 to 11.31.  Her comprehensive metabolic panel is normal and her total cholesterol is 109.  Suicidal Ideation: No Plan Formed: No Patient has means to carry out plan: No  Homicidal Ideation: No Plan Formed: No Patient has means to carry out plan: No  Past Psychiatric History/Hospitalization(s): Patient reported having depression when she was studying at [employer] state [employer].  She took Prozac and Effexor in the past.  She did not like Prozac and she had withdrawals from Effexor.  She was diagnosed bipolar disorder few years ago when  she remember having mania , mood swing and anger issues.  Since then she is taking Lamictal .  Patient denies any history of psychiatric inpatient treatment, suicidal attempt but admitted history of fleeting and suicidal thoughts .  Patient denies any self abusive behavior.  She was seeing Jobie Quaker at [facility] but not happy with the services.  Anxiety: Yes Bipolar Disorder: Yes Depression: Yes Mania: Yes Psychosis: No Schizophrenia: No Personality Disorder: No Hospitalization for psychiatric illness: No History of Electroconvulsive Shock Therapy: No Prior Suicide Attempts: No  Family History; Patient endorsed multiple family member has mental illness but they are not diagnosed.  Medical History; Patient is obese and had prediabetes, hyperlipidemia and hypothyroidism.  She see Dr. Hildred Laser.  Patient denies any history of seizures.  Review of Systems  Constitutional: Negative.   HENT: Negative.   Eyes: Negative.   Respiratory: Negative.   Cardiovascular: Negative.   Gastrointestinal: Negative.   Genitourinary: Negative.   Musculoskeletal: Negative.   Skin: Negative.  Negative for itching and rash.  Neurological: Negative.   Endo/Heme/Allergies: Negative.     Psychiatric: Agitation: No Hallucination: No Depressed Mood: No Insomnia: Yes Hypersomnia: No Altered Concentration: No Feels Worthless: No Grandiose Ideas: No Belief In Special Powers: No New/Increased Substance Abuse: No Compulsions: No  Neurologic: Headache: No Seizure: No Paresthesias: No   Outpatient Encounter Prescriptions as of 04/29/2016  Medication Sig  . atorvastatin (LIPITOR) 20 MG tablet Take 1 tablet (20 mg total) by mouth daily at 6 PM. Repeat labs are due  now  . clonazePAM (KLONOPIN) 0.5 MG tablet Take 1 tablet (0.5 mg total) by mouth daily as needed for anxiety.  Marland Kitchen glucose blood test strip Check blood sugar twice daily. one touch verio flex  . IBUPROFEN PO Take by mouth as needed.  .  lamoTRIgine (LAMICTAL) 150 MG tablet Take 1 tablet (150 mg total) by mouth 2 (two) times daily. Take 1 tablet in the AM, 1 tablet in the PM  . Lancets (ONETOUCH ULTRASOFT) lancets Use as instructed  . levonorgestrel (MIRENA) 20 MCG/24HR IUD 1 Intra Uterine Device (1 each total) by Intrauterine route once.  Marland Kitchen levothyroxine (SYNTHROID, LEVOTHROID) 150 MCG tablet Take 1 tablet (150 mcg total) by mouth daily. Repeat labs are due now  . sertraline (ZOLOFT) 100 MG tablet Take 1 tablet (100 mg total) by mouth daily.  . [DISCONTINUED] clonazePAM (KLONOPIN) 0.5 MG tablet Take 1 tablet (0.5 mg total) by mouth 2 (two) times daily as needed for anxiety.  . [DISCONTINUED] lamoTRIgine (LAMICTAL) 150 MG tablet Take 1 tablet (150 mg total) by mouth 2 (two) times daily. Take 1 tablet in the AM, 1 tablet in the PM  . [DISCONTINUED] sertraline (ZOLOFT) 100 MG tablet Take 1 tablet (100 mg total) by mouth daily.   No facility-administered encounter medications on file as of 04/29/2016.     Recent Results (from the past 2160 hour(s))  Lipid panel     Status: Abnormal   Collection Time: 04/22/16  7:53 AM  Result Value Ref Range   Cholesterol 109 0 - 200 mg/dL    Comment: ATP III Classification       Desirable:  < 200 mg/dL               Borderline High:  200 - 239 mg/dL          High:  > = 240 mg/dL   Triglycerides 111.0 0.0 - 149.0 mg/dL    Comment: Normal:  <150 mg/dLBorderline High:  150 - 199 mg/dL   HDL 35.50 (L) >39.00 mg/dL   VLDL 22.2 0.0 - 40.0 mg/dL   LDL Cholesterol 52 0 - 99 mg/dL   Total CHOL/HDL Ratio 3     Comment:                Men          Women1/2 Average Risk     3.4          3.3Average Risk          5.0          4.42X Average Risk          9.6          7.13X Average Risk          15.0          11.0                       NonHDL 73.83     Comment: NOTE:  Non-HDL goal should be 30 mg/dL higher than patient's LDL goal (i.e. LDL goal of < 70 mg/dL, would have non-HDL goal of < 100 mg/dL)   Comprehensive metabolic panel     Status: None   Collection Time: 04/22/16  7:53 AM  Result Value Ref Range   Sodium 139 135 - 145 mEq/L   Potassium 3.7 3.5 - 5.1 mEq/L   Chloride 105 96 - 112 mEq/L   CO2 29 19 - 32 mEq/L   Glucose,  Bld 96 70 - 99 mg/dL   BUN 14 6 - 23 mg/dL   Creatinine, Ser 0.79 0.40 - 1.20 mg/dL   Total Bilirubin 0.6 0.2 - 1.2 mg/dL   Alkaline Phosphatase 70 39 - 117 U/L   AST 14 0 - 37 U/L   ALT 18 0 - 35 U/L   Total Protein 7.1 6.0 - 8.3 g/dL   Albumin 4.3 3.5 - 5.2 g/dL   Calcium 9.3 8.4 - 10.5 mg/dL   GFR 85.51 >60.00 mL/min  TSH     Status: Abnormal   Collection Time: 04/22/16  7:53 AM  Result Value Ref Range   TSH 11.31 (H) 0.35 - 4.50 uIU/mL  Hemoglobin A1c     Status: None   Collection Time: 04/22/16  7:53 AM  Result Value Ref Range   Hgb A1c MFr Bld 6.2 4.6 - 6.5 %    Comment: Glycemic Control Guidelines for People with Diabetes:Non Diabetic:  <6%Goal of Therapy: <7%Additional Action Suggested:  >8%       Constitutional:  BP 122/70   Pulse 80   Ht 5\' 6"  (1.676 m)   Wt 206 lb 3.2 oz (93.5 kg)   BMI 33.28 kg/m    Musculoskeletal: Strength & Muscle Tone: within normal limits Gait & Station: normal Patient leans: N/A  Psychiatric Specialty Exam: General Appearance: Casual and Fairly Groomed  Engineer, water::  Fair  Speech:  Normal Rate  Volume:  Normal  Mood:  Anxious  Affect:  Appropriate  Thought Process:  Circumstantial  Orientation:  Full (Time, Place, and Person)  Thought Content:  Rumination  Suicidal Thoughts:  No  Homicidal Thoughts:  No  Memory:  Immediate;   Fair Recent;   Fair Remote;   Good  Judgement:  Fair  Insight:  Good  Psychomotor Activity:  Normal  Concentration:  Fair  Recall:  [place] of Knowledge:  Good  Language:  Good  Akathisia:  No  Handed:  Right  AIMS (if indicated):     Assets:  Communication Skills Desire for Improvement Financial Resources/Insurance Housing Physical Health Social  Support Transportation  ADL's:  Intact  Cognition:  WNL  Sleep:        Established Problem, Stable/Improving (1), Review of Psycho-Social Stressors (1), Review or order clinical lab tests (1), Review and summation of old records (2), Review of Last Therapy Session (1) and Review of Medication Regimen & Side Effects (2)  Assessment: Axis I: Bipolar disorder type I.  Rule out.  Depressive disorder recurrent mild.  Axis II: Deferred  Axis III:  Past Medical History:  Diagnosis Date  . Bipolar 1 disorder ([place])   . Depression   . Diabetes mellitus without complication ([place])   . Migraines    with aura  . OSA (obstructive sleep apnea) 10/17/2013  . Thyroid disease      Plan:  Patient is a stable on her current medication.  I review her blood work results.  She does not want to change her medication.  I will continue Lamictal 300 mg daily, Zoloft 100 mg daily.  She takes Klonopin very rarely for severe anxiety and insomnia.  Discussed medication side effects and benefits.  At this time patient is not interested in counseling.  We are still awaiting collateral information from primary care physician.  Recommended to call us back if she has any question, concern if she feels worsening of the symptom.  Follow-up in 3 months.  Glennie Bose T., MD 04/29/2016

## 2016-04-30 ENCOUNTER — Other Ambulatory Visit: Payer: Self-pay

## 2016-04-30 DIAGNOSIS — E039 Hypothyroidism, unspecified: Secondary | ICD-10-CM

## 2016-04-30 MED ORDER — LEVOTHYROXINE SODIUM 150 MCG PO TABS: 175.0000 ug | ORAL_TABLET | Freq: Every day | ORAL | 2 refills | Status: DC

## 2016-04-30 NOTE — Progress Notes (Signed)
Sent in new Rx change for Synthroid 150 mcg to 175 mcg, per PCP request in result notes.

## 2016-06-25 ENCOUNTER — Other Ambulatory Visit: Payer: Self-pay | Admitting: Family Medicine

## 2016-07-27 ENCOUNTER — Other Ambulatory Visit: Payer: Self-pay | Admitting: Family Medicine

## 2016-07-30 ENCOUNTER — Encounter: Payer: Self-pay | Admitting: Certified Nurse Midwife

## 2016-08-04 ENCOUNTER — Encounter: Payer: Self-pay | Admitting: Certified Nurse Midwife

## 2016-08-04 ENCOUNTER — Ambulatory Visit (INDEPENDENT_AMBULATORY_CARE_PROVIDER_SITE_OTHER): Payer: 59 | Admitting: Certified Nurse Midwife

## 2016-08-04 VITALS — BP 112/72 | HR 68 | Resp 16 | Ht 65.75 in | Wt 200.0 lb

## 2016-08-04 DIAGNOSIS — Z124 Encounter for screening for malignant neoplasm of cervix: Secondary | ICD-10-CM | POA: Diagnosis not present

## 2016-08-04 DIAGNOSIS — Z01419 Encounter for gynecological examination (general) (routine) without abnormal findings: Secondary | ICD-10-CM | POA: Diagnosis not present

## 2016-08-04 DIAGNOSIS — Z30432 Encounter for removal of intrauterine contraceptive device: Secondary | ICD-10-CM

## 2016-08-04 DIAGNOSIS — Z23 Encounter for immunization: Secondary | ICD-10-CM | POA: Diagnosis not present

## 2016-08-04 NOTE — Patient Instructions (Signed)

## 2016-08-04 NOTE — Progress Notes (Signed)
41 y.o. G0P0000 Single  Caucasian Fe here to re-establish gyn care and  for annual exam. Sees Dr. Cheri Rous for Type Diabetes 2 management, Cholesterol management and labs, aex. All stable at present. Sees [facility] for depression and bipolar management. Patient has loss 30 pounds with working on glucose management. Smoker one pack a day, plans cessation next year. Had Moved to [place] 4 years ago and had been seen there. Now in [place] again. Planning marriage in 10/18 and plans to not use contraception after IUD removal. Pregnancy would be welcome. Aware she would need to be in good control with diabetes and managed closely. Will work on good control and diet management with PCP at this point. Desires no labs today unless needed. No other health issues today.  No LMP recorded. Patient is not currently having periods (Reason: IUD).          Sexually active: Yes.    The current method of family planning is IUD.    Exercising: No.  exercise Smoker:  yes  Health Maintenance: Pap:  Maybe 44yrs ago, last visit here MMG:  03-24-16 category b density birads 1:neg Colonoscopy:  2010 BMD:   none TDaP:  2005 Shingles: no Pneumonia: no Hep C and HIV: HIV neg done in college Labs: none Self breast exam: done occ   reports that she has been smoking Cigarettes.  She has a 20.00 pack-year smoking history. She has never used smokeless tobacco. She reports that she does not drink alcohol or use drugs.  Past Medical History:  Diagnosis Date  . Anxiety   . Bipolar 1 disorder ([place])   . Bipolar 2 disorder ([place])   . Depression   . Diabetes mellitus without complication ([place])   . Migraines    with aura  . OSA (obstructive sleep apnea) 10/17/2013  . Thyroid disease     Past Surgical History:  Procedure Laterality Date  . INTRAUTERINE DEVICE (IUD) INSERTION  5/13   inserted  . NASAL SEPTUM SURGERY    . OTHER SURGICAL HISTORY     endometrial polyps (2)  . WISDOM TOOTH EXTRACTION       Current Outpatient Prescriptions  Medication Sig Dispense Refill  . atorvastatin (LIPITOR) 20 MG tablet TAKE 1 TABLET(20 MG) BY MOUTH DAILY AT 6 PM. REPEAT LABS ARE DUE NOW 30 tablet 0  . clonazePAM (KLONOPIN) 0.5 MG tablet Take 1 tablet (0.5 mg total) by mouth daily as needed for anxiety. 30 tablet 0  . glucose blood test strip Check blood sugar twice daily. one touch verio flex 100 each 12  . IBUPROFEN PO Take by mouth as needed.    . lamoTRIgine (LAMICTAL) 150 MG tablet Take 1 tablet (150 mg total) by mouth 2 (two) times daily. Take 1 tablet in the AM, 1 tablet in the PM 60 tablet 2  . Lancets (ONETOUCH ULTRASOFT) lancets Use as instructed 100 each 12  . levonorgestrel (MIRENA) 20 MCG/24HR IUD 1 Intra Uterine Device (1 each total) by Intrauterine route once. 1 each 0  . levothyroxine (SYNTHROID, LEVOTHROID) 150 MCG tablet Take 1 tablet (150 mcg total) by mouth daily. Repeat labs are due now 30 tablet 2  . sertraline (ZOLOFT) 100 MG tablet Take 1 tablet (100 mg total) by mouth daily. 30 tablet 2   No current facility-administered medications for this visit.     Family History  Problem Relation Age of Onset  . Cancer Mother 52    ovarian  . Hypothyroidism Mother   .  Hypertension Father   . Diabetes Father   . Hyperlipidemia Father   . Heart disease Father     cabg--stint  . Migraines Father   . Hypothyroidism Sister   . Depression Sister   . Cancer Maternal Aunt     breast   . Cancer Maternal Uncle     throat  . COPD Maternal Grandmother   . Cancer Maternal Grandmother     lung  . Hypertension Maternal Grandmother   . Depression Maternal Grandmother   . Stroke Maternal Grandmother   . Cancer Maternal Grandfather     prostate, lung  . Diabetes Paternal Grandmother   . Stroke Paternal Grandmother   . Alcohol abuse Paternal Grandfather   . Aneurysm Paternal Grandfather   . Depression Cousin     bipolar    ROS:  Pertinent items are noted in HPI.  Otherwise, a  comprehensive ROS was negative.  Exam:   BP 112/72   Pulse 68   Resp 16   Ht 5' 5.75" (1.67 m)   Wt 200 lb (90.7 kg)   BMI 32.53 kg/m  Height: 5' 5.75" (167 cm) Ht Readings from Last 3 Encounters:  08/04/16 5' 5.75" (1.67 m)  01/23/16 5\' 6"  (1.676 m)  10/30/15 5\' 6"  (1.676 m)    General appearance: alert, cooperative and appears stated age Head: Normocephalic, without obvious abnormality, atraumatic Neck: no adenopathy, supple, symmetrical, trachea midline and thyroid normal to inspection and palpation Lungs: clear to auscultation bilaterally Breasts: normal appearance, no masses or tenderness, No nipple retraction or dimpling, No nipple discharge or bleeding, No axillary or supraclavicular adenopathy Heart: regular rate and rhythm Abdomen: soft, non-tender; no masses,  no organomegaly Extremities: extremities normal, atraumatic, no cyanosis or edema Skin: Skin color, texture, turgor normal. No rashes or lesions Lymph nodes: Cervical, supraclavicular, and axillary nodes normal. No abnormal inguinal nodes palpated Neurologic: Grossly normal   Pelvic: External genitalia:  no lesions              Urethra:  normal appearing urethra with no masses, tenderness or lesions              Bartholin's and Skene's: normal                 Vagina: normal appearing vagina with normal color and discharge, no lesions              Cervix: no cervical motion tenderness, no lesions, nulliparous appearance and IUD string noted in cervical os             Bimanual Exam:  Uterus:  normal size, contour, position, consistency, mobility, non-tender              Adnexa: normal adnexa and no mass, fullness, tenderness               Rectovaginal: Confirms               Anus:  normal sphincter tone, no lesions  Chaperone present: yes  A:  Well Woman with normal exam  Contraception MIrena IUD due for removal 5/18 R/O vaginal infection due to vaginal odor  Type 2 Diabetes/Cholesterol/Hypothyroid with PCP  management all stable medications  Bipolar with medication management with Psychiatrist medications stable  Immunization due  May plan pregnancy later this year, getting married  Smoker, not interested in cessation at this point  P:   Reviewed health and wellness pertinent to exam  Will place order for removal in 5/18, patient  will be called with information and scheduled  Await affirm results  Continue follow up with MD as indicated  Requests TDAP today  Discussed would need management with medications prior to trying to conceive and should discuss with her MD's regarding medication use and pregnancy.  Recommend Rubella titer, so if needed could do before IUD removal. Declines today. Will call if decides to do  Aware of risks of smoking and risks during pregnancy.  Pap smear as above with HPVHR   counseled on breast self exam, adequate intake of calcium and vitamin D, diet and exercise  return annually or prn  An After Visit Summary was printed and given to the patient.

## 2016-08-05 ENCOUNTER — Ambulatory Visit (INDEPENDENT_AMBULATORY_CARE_PROVIDER_SITE_OTHER): Payer: 59 | Admitting: Psychiatry

## 2016-08-05 ENCOUNTER — Telehealth: Payer: Self-pay

## 2016-08-05 ENCOUNTER — Encounter (HOSPITAL_COMMUNITY): Payer: Self-pay | Admitting: Psychiatry

## 2016-08-05 DIAGNOSIS — Z818 Family history of other mental and behavioral disorders: Secondary | ICD-10-CM

## 2016-08-05 DIAGNOSIS — Z801 Family history of malignant neoplasm of trachea, bronchus and lung: Secondary | ICD-10-CM

## 2016-08-05 DIAGNOSIS — F3181 Bipolar II disorder: Secondary | ICD-10-CM

## 2016-08-05 DIAGNOSIS — Z9889 Other specified postprocedural states: Secondary | ICD-10-CM

## 2016-08-05 DIAGNOSIS — Z8041 Family history of malignant neoplasm of ovary: Secondary | ICD-10-CM

## 2016-08-05 DIAGNOSIS — Z823 Family history of stroke: Secondary | ICD-10-CM

## 2016-08-05 DIAGNOSIS — F411 Generalized anxiety disorder: Secondary | ICD-10-CM

## 2016-08-05 DIAGNOSIS — Z803 Family history of malignant neoplasm of breast: Secondary | ICD-10-CM

## 2016-08-05 DIAGNOSIS — Z79899 Other long term (current) drug therapy: Secondary | ICD-10-CM

## 2016-08-05 DIAGNOSIS — Z9102 Food additives allergy status: Secondary | ICD-10-CM

## 2016-08-05 DIAGNOSIS — F17210 Nicotine dependence, cigarettes, uncomplicated: Secondary | ICD-10-CM

## 2016-08-05 DIAGNOSIS — Z8249 Family history of ischemic heart disease and other diseases of the circulatory system: Secondary | ICD-10-CM

## 2016-08-05 DIAGNOSIS — Z811 Family history of alcohol abuse and dependence: Secondary | ICD-10-CM

## 2016-08-05 LAB — WET PREP BY MOLECULAR PROBE
CANDIDA SPECIES: NEGATIVE
GARDNERELLA VAGINALIS: POSITIVE — AB
TRICHOMONAS VAG: NEGATIVE

## 2016-08-05 MED ORDER — CLONAZEPAM 0.5 MG PO TABS: 0.5000 mg | ORAL_TABLET | Freq: Every day | ORAL | 0 refills | Status: DC | PRN

## 2016-08-05 MED ORDER — LAMOTRIGINE 150 MG PO TABS: 150.0000 mg | ORAL_TABLET | Freq: Two times a day (BID) | ORAL | 2 refills | Status: DC

## 2016-08-05 MED ORDER — SERTRALINE HCL 100 MG PO TABS: 100.0000 mg | ORAL_TABLET | Freq: Every day | ORAL | 2 refills | Status: DC

## 2016-08-05 NOTE — Telephone Encounter (Signed)
No voice mail.

## 2016-08-05 NOTE — Progress Notes (Signed)
Encounter reviewed. Would definitely recommend reviewing all of the risks of pregnancy with AMA and her medical conditions when removing her IUD (Ms Hollice Espy and I have discussed this).  Sumner Boast, MD

## 2016-08-05 NOTE — Telephone Encounter (Signed)
-----   Message from Regina Eck, CNM sent at 08/05/2016  8:16 AM EST ----- Notify patient that wet prep positive for BV,needs Rx Metrogel one applicator vag. Bid x 5 Yeast and trichomonas negative

## 2016-08-05 NOTE — Progress Notes (Signed)
BH MD/PA/NP OP Progress Note  08/05/2016 4:29 PM Cheryl [name]  MRN:  [id number]  Chief Complaint:  Subjective:  I'm doing good.  My medicine is working okay.  HPI: Patient came for her follow-up appointment.  She is taking her medication and reported no side effects.  She denies any irritability, anger, mania or any psychosis.  Sometimes she get sad and isolated which she believed due to winter season.  However she denies any suicidal thoughts or homicidal thought.  Her appetite is good.  She sleeping much better.  She daily takes Klonopin for anxiety.  Patient denies any hallucination, paranoia, suicidal thoughts or homicidal thought.  Her energy level is good.  She has no rash, itching or any EPS.  Patient denies drinking alcohol or using any illegal substances.  Patient never married and she has no children.  Patient very close to her sister and her mother who are very supportive.  Visit Diagnosis:    ICD-9-CM ICD-10-CM   1. Bipolar 2 disorder, major depressive episode (HCC) 296.89 F31.81 sertraline (ZOLOFT) 100 MG tablet     lamoTRIgine (LAMICTAL) 150 MG tablet     clonazePAM (KLONOPIN) 0.5 MG tablet  2. Generalized anxiety disorder 300.02 F41.1 clonazePAM (KLONOPIN) 0.5 MG tablet    Past Psychiatric History: Reviewed. Patient reported having depression when she was studying at [employer].  She took Prozac and Effexor in the past.  She did not like Prozac and she had withdrawals from Effexor.  She was diagnosed bipolar disorder few years ago when she remember having mania , mood swing and anger issues.  Since then she is taking Lamictal .  Patient denies any history of psychiatric inpatient treatment, suicidal attempt but admitted history of fleeting and suicidal thoughts .  Patient denies any self abusive behavior.  She was seeing Jobie Quaker at [facility] but not happy with the services.   Past Medical History:  Past Medical History:  Diagnosis Date  . Anxiety    . Bipolar 1 disorder ([place])   . Bipolar 2 disorder ([place])   . Depression   . Diabetes mellitus without complication ([place])   . Migraines    with aura  . OSA (obstructive sleep apnea) 10/17/2013  . Thyroid disease     Past Surgical History:  Procedure Laterality Date  . INTRAUTERINE DEVICE (IUD) INSERTION  5/13   inserted  . NASAL SEPTUM SURGERY    . OTHER SURGICAL HISTORY     endometrial polyps (2)  . WISDOM TOOTH EXTRACTION      Family Psychiatric History: Reviewed.  Family History:  Family History  Problem Relation Age of Onset  . Cancer Mother 16    ovarian  . Hypothyroidism Mother   . Hypertension Father   . Diabetes Father   . Hyperlipidemia Father   . Heart disease Father     cabg--stint  . Migraines Father   . Hypothyroidism Sister   . Depression Sister   . Cancer Maternal Aunt     breast   . Cancer Maternal Uncle     throat  . COPD Maternal Grandmother   . Cancer Maternal Grandmother     lung  . Hypertension Maternal Grandmother   . Depression Maternal Grandmother   . Stroke Maternal Grandmother   . Cancer Maternal Grandfather     prostate, lung  . Diabetes Paternal Grandmother   . Stroke Paternal Grandmother   . Alcohol abuse Paternal Grandfather   . Aneurysm Paternal Grandfather   .  Depression Cousin     bipolar    Social History:  Social History   Social History  . Marital status: Single    Spouse name: N/A  . Number of children: N/A  . Years of education: N/A   Occupational History  . [employer]   Social History Main Topics  . Smoking status: Current Every Day Smoker    Packs/day: 1.00    Years: 20.00    Types: Cigarettes  . Smokeless tobacco: Never Used  . Alcohol use No  . Drug use: No  . Sexual activity: Yes    Partners: Male    Birth control/ protection: IUD   Other Topics Concern  . Not on file   Social History Narrative   Exercise-- no    Allergies:  Allergies  Allergen  Reactions  . Food     bison    Metabolic Disorder Labs: Lab Results  Component Value Date   HGBA1C 6.2 04/22/2016   No results found for: PROLACTIN Lab Results  Component Value Date   CHOL 109 04/22/2016   TRIG 111.0 04/22/2016   HDL 35.50 (L) 04/22/2016   CHOLHDL 3 04/22/2016   VLDL 22.2 04/22/2016   LDLCALC 52 04/22/2016   LDLCALC 206 (H) 09/28/2013     Current Medications: Current Outpatient Prescriptions  Medication Sig Dispense Refill  . atorvastatin (LIPITOR) 20 MG tablet TAKE 1 TABLET(20 MG) BY MOUTH DAILY AT 6 PM. REPEAT LABS ARE DUE NOW 30 tablet 0  . clonazePAM (KLONOPIN) 0.5 MG tablet Take 1 tablet (0.5 mg total) by mouth daily as needed for anxiety. 30 tablet 0  . glucose blood test strip Check blood sugar twice daily. one touch verio flex 100 each 12  . IBUPROFEN PO Take by mouth as needed.    . lamoTRIgine (LAMICTAL) 150 MG tablet Take 1 tablet (150 mg total) by mouth 2 (two) times daily. Take 1 tablet in the AM, 1 tablet in the PM 60 tablet 2  . Lancets (ONETOUCH ULTRASOFT) lancets Use as instructed 100 each 12  . levonorgestrel (MIRENA) 20 MCG/24HR IUD 1 Intra Uterine Device (1 each total) by Intrauterine route once. 1 each 0  . levothyroxine (SYNTHROID, LEVOTHROID) 150 MCG tablet Take 1 tablet (150 mcg total) by mouth daily. Repeat labs are due now 30 tablet 2  . sertraline (ZOLOFT) 100 MG tablet Take 1 tablet (100 mg total) by mouth daily. 30 tablet 2   No current facility-administered medications for this visit.     Neurologic: Headache: No Seizure: No Paresthesias: No  Musculoskeletal: Strength & Muscle Tone: within normal limits Gait & Station: normal Patient leans: N/A  Psychiatric Specialty Exam: Review of Systems  Constitutional: Negative.   Skin: Negative.  Negative for itching and rash.  Neurological: Negative.     Blood pressure 126/72, pulse 91, height 5' 5.75" (1.67 m), weight 200 lb 12.8 oz (91.1 kg).There is no height or weight on  file to calculate BMI.  General Appearance: Casual  Eye Contact:  Good  Speech:  Clear and Coherent  Volume:  Normal  Mood:  Euthymic  Affect:  Congruent  Thought Process:  Goal Directed  Orientation:  Full (Time, Place, and Person)  Thought Content: WDL and Logical   Suicidal Thoughts:  No  Homicidal Thoughts:  No  Memory:  Immediate;   Good Recent;   Good Remote;   Good  Judgement:  Good  Insight:  Good  Psychomotor Activity:  Normal  Concentration:  Concentration: Good and Attention Span: Good  Recall:  Good  Fund of Knowledge: Good  Language: Good  Akathisia:  No  Handed:  Right  AIMS (if indicated):  0  Assets:  Communication Skills Desire for Improvement Housing Physical Health Resilience  ADL's:  Intact  Cognition: WNL  Sleep:  good   Assessment: Bipolar 2 disorder.  Anxiety disorder NOS.  Plan: Patient doing better on her current psychiatric medication.  I will continue Lamictal 300 mg daily, Zoloft or milligrams daily and Klonopin as needed.  Discussed medication side effects and benefits.  Recommended to call us back if she has any question, concern or if she feels worsening of the symptom.  Follow-up in 3 months.  Helane Briceno T., MD 08/05/2016, 4:29 PM

## 2016-08-06 ENCOUNTER — Telehealth: Payer: Self-pay

## 2016-08-06 ENCOUNTER — Other Ambulatory Visit: Payer: Self-pay

## 2016-08-06 LAB — IPS PAP TEST WITH HPV

## 2016-08-06 MED ORDER — METRONIDAZOLE 0.75 % VA GEL: VAGINAL | 0 refills | Status: DC

## 2016-08-06 NOTE — Telephone Encounter (Signed)
lmtcb

## 2016-08-06 NOTE — Telephone Encounter (Signed)
Patient notified of results. See lab 

## 2016-08-06 NOTE — Telephone Encounter (Signed)
-----   Message from Regina Eck, CNM sent at 08/06/2016  4:02 PM EST ----- Notify patient that pap smear is ASCUS, but HPVHR not detected, no further evaluation at this time. Repeat pap smear at next aex. 02 Pap showed BV which has been addressed. This may explain pap results.

## 2016-08-11 NOTE — Telephone Encounter (Signed)
No voicemail. Set up.

## 2016-08-13 NOTE — Telephone Encounter (Signed)
Released to mychart. Pt viewed results.

## 2016-08-21 ENCOUNTER — Emergency Department (HOSPITAL_COMMUNITY)
Admission: EM | Admit: 2016-08-21 | Discharge: 2016-08-21 | Disposition: A | Discharge status: Home / Self Care | Payer: 59 | Attending: Emergency Medicine | Admitting: Emergency Medicine

## 2016-08-21 ENCOUNTER — Encounter (HOSPITAL_COMMUNITY): Payer: Self-pay | Admitting: Emergency Medicine

## 2016-08-21 ENCOUNTER — Emergency Department (HOSPITAL_COMMUNITY): Payer: 59

## 2016-08-21 DIAGNOSIS — Z79899 Other long term (current) drug therapy: Secondary | ICD-10-CM | POA: Insufficient documentation

## 2016-08-21 DIAGNOSIS — F17210 Nicotine dependence, cigarettes, uncomplicated: Secondary | ICD-10-CM | POA: Diagnosis not present

## 2016-08-21 DIAGNOSIS — N200 Calculus of kidney: Secondary | ICD-10-CM | POA: Diagnosis not present

## 2016-08-21 DIAGNOSIS — E119 Type 2 diabetes mellitus without complications: Secondary | ICD-10-CM | POA: Insufficient documentation

## 2016-08-21 DIAGNOSIS — E039 Hypothyroidism, unspecified: Secondary | ICD-10-CM | POA: Diagnosis not present

## 2016-08-21 DIAGNOSIS — R1032 Left lower quadrant pain: Secondary | ICD-10-CM | POA: Diagnosis present

## 2016-08-21 LAB — CBC
HCT: 42.7 % (ref 36.0–46.0)
Hemoglobin: 14.3 g/dL (ref 12.0–15.0)
MCH: 31.2 pg (ref 26.0–34.0)
MCHC: 33.5 g/dL (ref 30.0–36.0)
MCV: 93 fL (ref 78.0–100.0)
PLATELETS: 227 10*3/uL (ref 150–400)
RBC: 4.59 MIL/uL (ref 3.87–5.11)
RDW: 12.2 % (ref 11.5–15.5)
WBC: 11.2 10*3/uL — ABNORMAL HIGH (ref 4.0–10.5)

## 2016-08-21 LAB — URINALYSIS, ROUTINE W REFLEX MICROSCOPIC
Bilirubin Urine: NEGATIVE
Glucose, UA: NEGATIVE mg/dL
Ketones, ur: NEGATIVE mg/dL
Leukocytes, UA: NEGATIVE
Nitrite: NEGATIVE
Protein, ur: NEGATIVE mg/dL
SPECIFIC GRAVITY, URINE: 1.015 (ref 1.005–1.030)
pH: 5 (ref 5.0–8.0)

## 2016-08-21 LAB — COMPREHENSIVE METABOLIC PANEL
ALK PHOS: 75 U/L (ref 38–126)
ALT: 20 U/L (ref 14–54)
AST: 19 U/L (ref 15–41)
Albumin: 4.5 g/dL (ref 3.5–5.0)
Anion gap: 12 (ref 5–15)
BUN: 15 mg/dL (ref 6–20)
CALCIUM: 10 mg/dL (ref 8.9–10.3)
CO2: 23 mmol/L (ref 22–32)
CREATININE: 0.93 mg/dL (ref 0.44–1.00)
Chloride: 102 mmol/L (ref 101–111)
Glucose, Bld: 130 mg/dL — ABNORMAL HIGH (ref 65–99)
Potassium: 3.9 mmol/L (ref 3.5–5.1)
Sodium: 137 mmol/L (ref 135–145)
Total Bilirubin: 0.4 mg/dL (ref 0.3–1.2)
Total Protein: 7.6 g/dL (ref 6.5–8.1)

## 2016-08-21 LAB — I-STAT BETA HCG BLOOD, ED (MC, WL, AP ONLY)

## 2016-08-21 LAB — LIPASE, BLOOD: Lipase: 31 U/L (ref 11–51)

## 2016-08-21 IMAGING — CT CT RENAL STONE PROTOCOL
2 of 4 series · 16 of 46 positions shown, 18 images · non-contrast
Comparison: None.

CLINICAL DATA: Left-sided abdominal pain/CVA tenderness.

EXAM:
CT ABDOMEN AND PELVIS WITHOUT CONTRAST
TECHNIQUE: Multidetector CT imaging of the abdomen and pelvis was performed
following the standard protocol without IV contrast.

[Series 2: renal stone 5.0 · axial · 0.81mm/px · z∈[+703,+1158]mm · 13 of 99 slices shown, 15 images]
[im 4/99  soft-tissue]
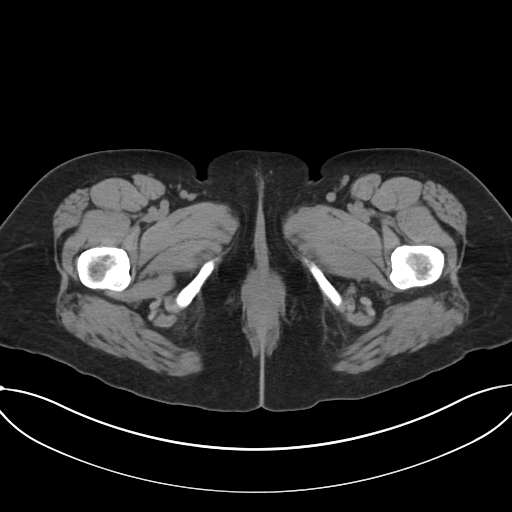
[im 4/99  bone]
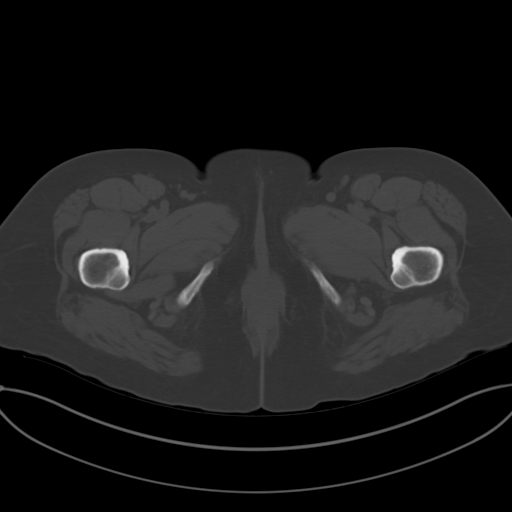
[im 12/99  soft-tissue]
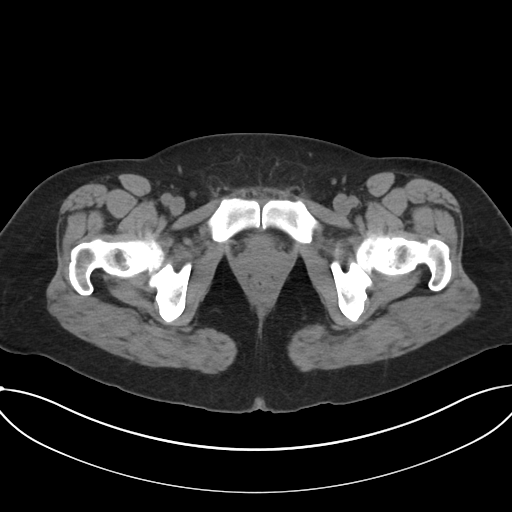
[im 20/99  soft-tissue]
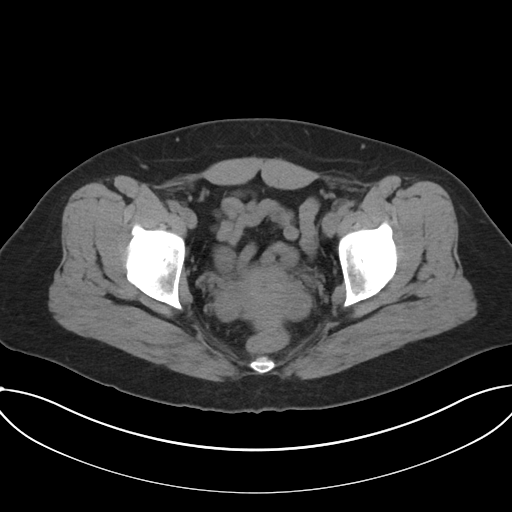
[im 28/99  soft-tissue]
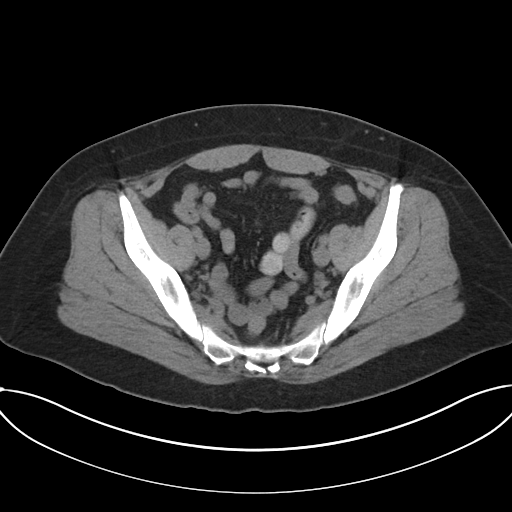
[im 36/99  soft-tissue]
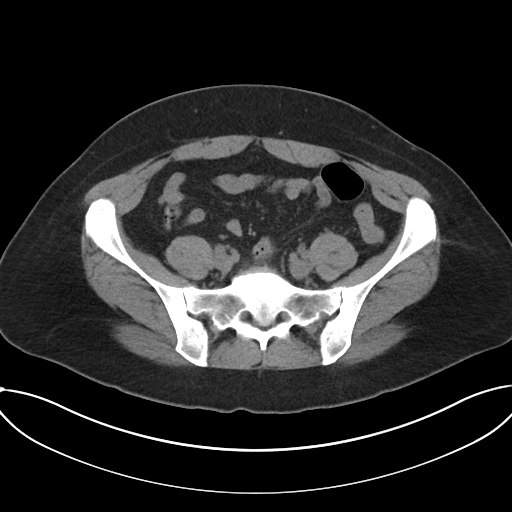
[im 44/99  soft-tissue]
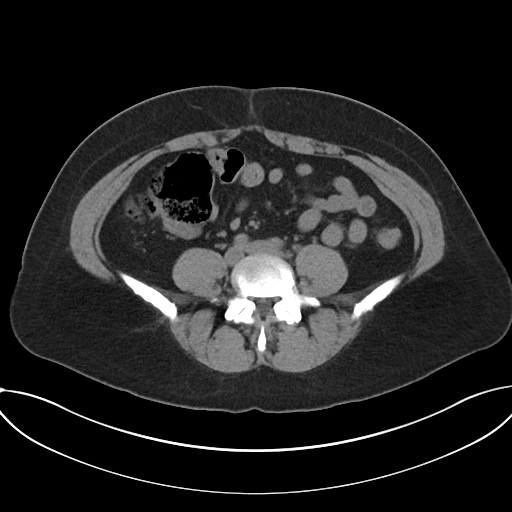
[im 51/99  soft-tissue]
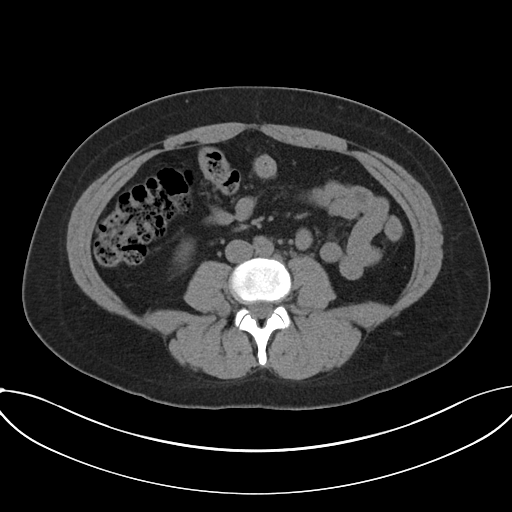
[im 55/99  soft-tissue]
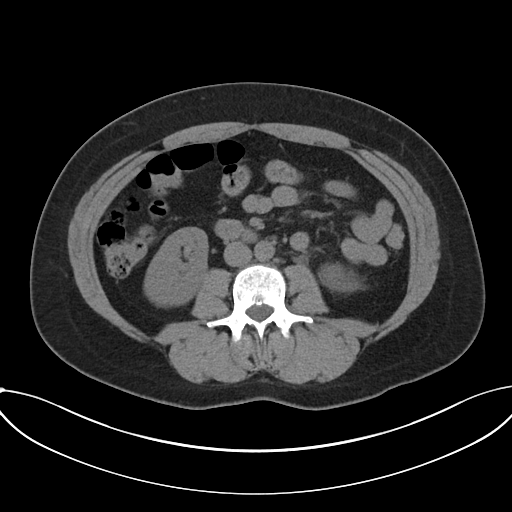
[im 63/99  soft-tissue]
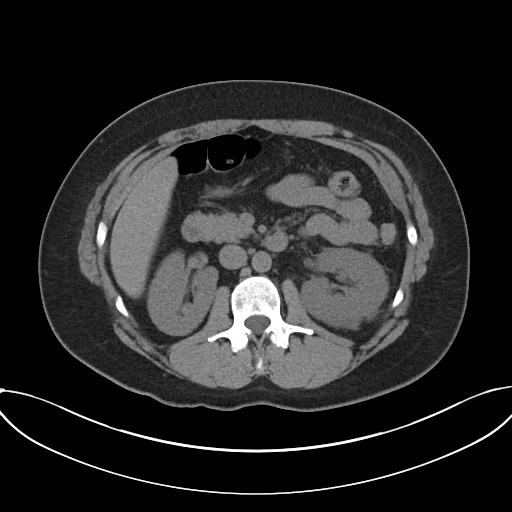
[im 63/99  bone]
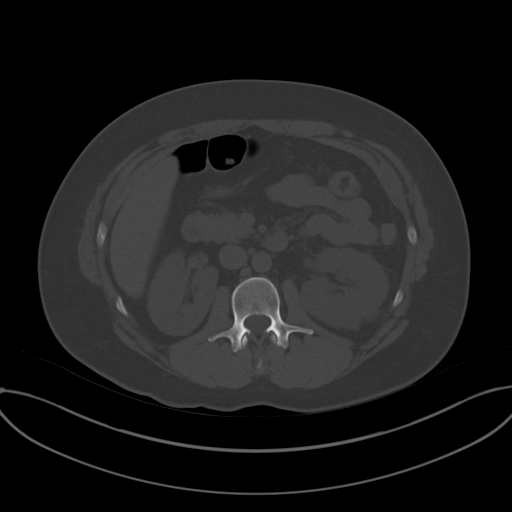
[im 71/99  soft-tissue]
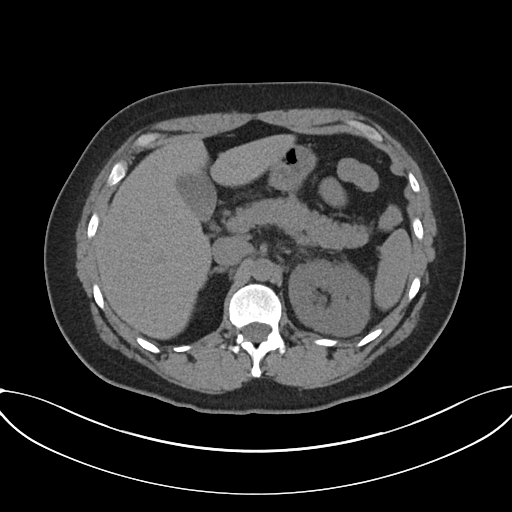
[im 79/99  soft-tissue]
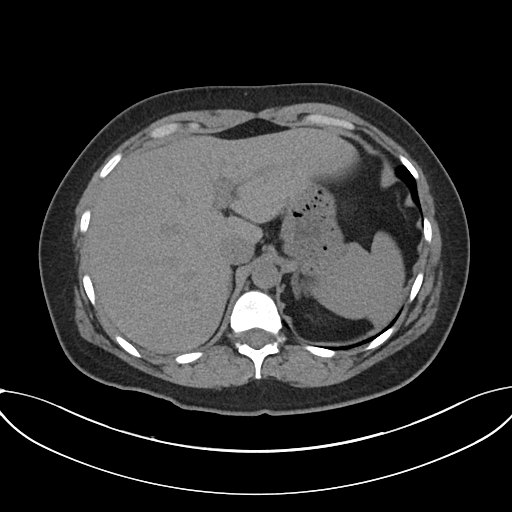
[im 87/99  soft-tissue]
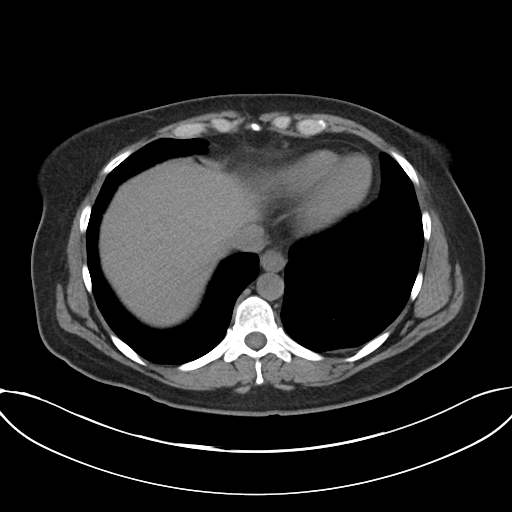
[im 95/99  soft-tissue]
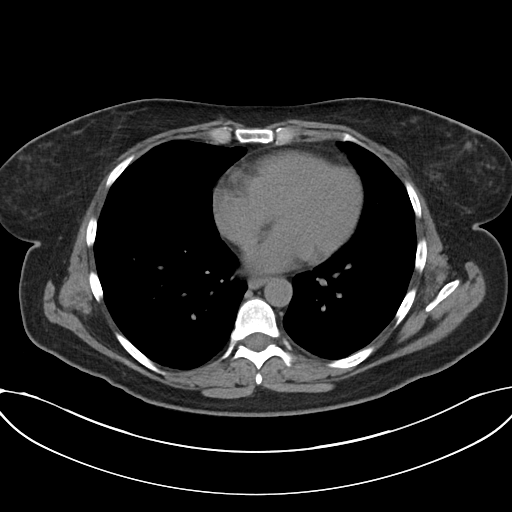

[Series 4: renal stone 3.0 cor · coronal · 0.72mm/px · 3 of 88 slices shown]
[im 30/88  soft-tissue]
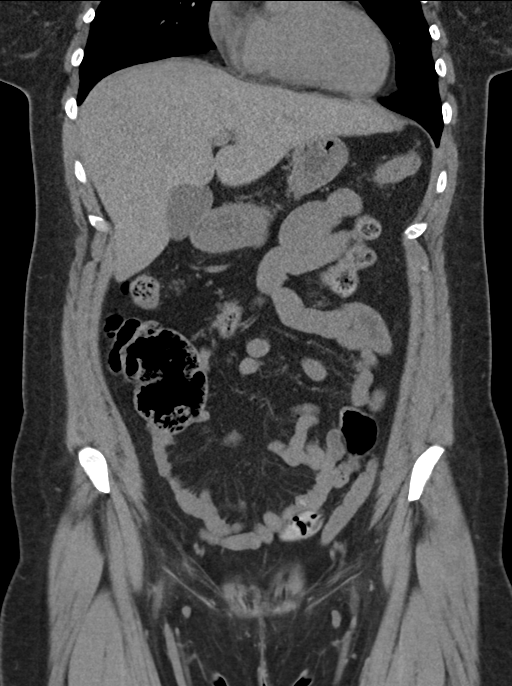
[im 39/88  soft-tissue]
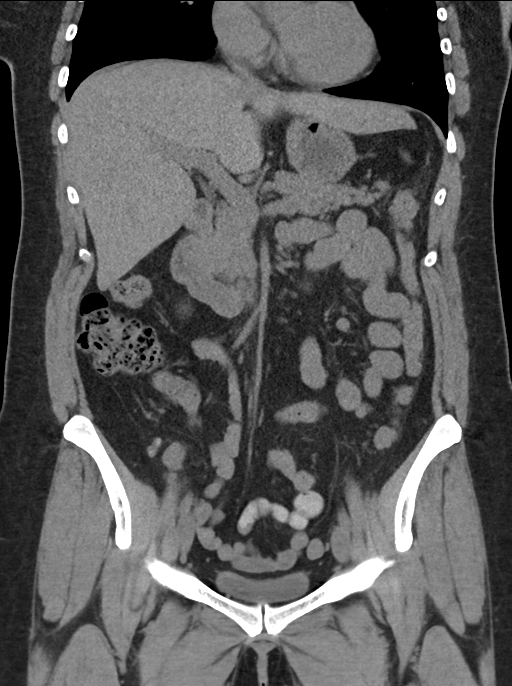
[im 49/88  soft-tissue]
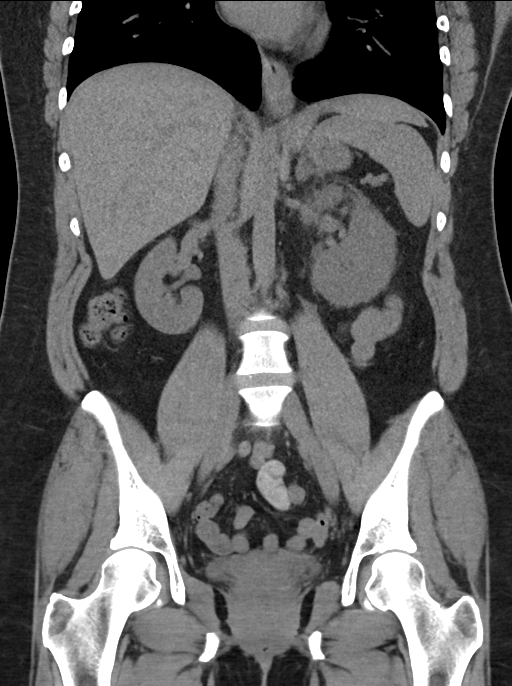

[16 of 46 positions shown; findings below may reference images not displayed]

FINDINGS: Lower chest: The lung bases are clear.

Hepatobiliary: No focal liver abnormality is seen. No gallstones,
gallbladder wall thickening, or biliary dilatation.

Pancreas: No ductal dilatation or inflammation.

Spleen: Normal in size without focal abnormality.

Adrenals/Urinary Tract: Punctate, less than 2 mm, stone at the left
ureterovesicular junction with mild left hydronephrosis and
perinephric edema. No additional nonobstructing stones in either
kidney. No right hydronephrosis. Urinary bladder is decompressed.
Low-density subcentimeter nodularity to the left adrenal gland is
consistent with adenoma. Right adrenal gland is normal.

Stomach/Bowel: Small hiatal hernia. Stomach is decompressed. No
evidence of bowel wall thickening, inflammation or obstruction.
Normal appendix.

Vascular/Lymphatic: Abdominal aorta is normal in caliber.
Retroperitoneal, mesenteric or pelvic adenopathy.

Reproductive: Intrauterine device appropriately positioned in the
uterus, which is retroverted. No adnexal mass.

Other: No free air, free fluid, or intra-abdominal fluid collection.

Musculoskeletal: Bilateral L5 pars interarticularis defects with
trace anterolisthesis of L5 on S1. Mild L5-S1 degenerative disc
disease.
IMPRESSION: 1. Punctate obstructing stone at the left ureterovesicular junction
with mild hydronephrosis and perinephric edema.
2. Small hiatal hernia.
3. Bilateral L5 pars interarticularis defects.

## 2016-08-21 MED ORDER — SODIUM CHLORIDE 0.9 % IV BOLUS (SEPSIS)
1000.0000 mL | Freq: Once | INTRAVENOUS | Status: AC
  Administered 2016-08-21: 1000 mL via INTRAVENOUS

## 2016-08-21 MED ORDER — ONDANSETRON 4 MG PO TBDP: 4.0000 mg | ORAL_TABLET | Freq: Three times a day (TID) | ORAL | 0 refills | Status: DC | PRN

## 2016-08-21 MED ORDER — MORPHINE SULFATE (PF) 4 MG/ML IV SOLN
4.0000 mg | Freq: Once | INTRAVENOUS | Status: AC
  Administered 2016-08-21: 4 mg via INTRAVENOUS
  Filled 2016-08-21: qty 1

## 2016-08-21 MED ORDER — KETOROLAC TROMETHAMINE 30 MG/ML IJ SOLN
15.0000 mg | Freq: Once | INTRAMUSCULAR | Status: AC
  Administered 2016-08-21: 15 mg via INTRAVENOUS
  Filled 2016-08-21: qty 1

## 2016-08-21 MED ORDER — TAMSULOSIN HCL 0.4 MG PO CAPS: 0.4000 mg | ORAL_CAPSULE | Freq: Two times a day (BID) | ORAL | 0 refills | Status: DC

## 2016-08-21 MED ORDER — OXYCODONE-ACETAMINOPHEN 5-325 MG PO TABS: 1.0000 | ORAL_TABLET | ORAL | 0 refills | Status: DC | PRN

## 2016-08-21 MED ORDER — ONDANSETRON HCL 4 MG/2ML IJ SOLN
4.0000 mg | Freq: Once | INTRAMUSCULAR | Status: AC
  Administered 2016-08-21: 4 mg via INTRAVENOUS
  Filled 2016-08-21: qty 2

## 2016-08-21 NOTE — ED Provider Notes (Signed)
[facility] DEPT Provider Note   CSN: [id number] Arrival date & time: 08/21/16  [place]     History   Chief Complaint Chief Complaint  Patient presents with  . Abdominal Pain  . Emesis  . Urinary Frequency    HPI Cheryl [name] is a 41 y.o. female.  HPI   Patient is a 41 year old female with history of type 2 diabetes who presents the ED with complaint of abdominal pain, onset 2 hours prior to arrival. Patient reports having sudden onset, waxing and waning sharp cramping pain to her left lower abdomen that radiates around to the left side of her back. Patient denies any aggravating or alleviating factors. Endorses associated nausea and 2 episodes of NBNB vomiting and increased urge to urinate. Denies taking any medications for her symptoms. Denies fever, chills, chest pain, shortness of breath, hematemesis, diarrhea, constipation, hematuria, dysuria, vaginal bleeding, vaginal discharge. Denies history of similar symptoms. Denies history of abdominal surgeries.  Past Medical History:  Diagnosis Date  . Anxiety   . Bipolar 1 disorder ([place])   . Bipolar 2 disorder ([place])   . Depression   . Diabetes mellitus without complication ([place])   . Migraines    with aura  . OSA (obstructive sleep apnea) 10/17/2013  . Thyroid disease     Patient Active Problem List   Diagnosis Date Noted  . Breast mass 11/20/2013  . OSA (obstructive sleep apnea) 10/17/2013  . Type II or unspecified type diabetes mellitus without mention of complication, not stated as uncontrolled 01/30/2013  . RECTAL BLEEDING 09/10/2008  . KNEE PAIN, LEFT 04/09/2008  . BACK PAIN 04/09/2008  . HYPOTHYROIDISM 02/17/2007  . HIRSUTISM 02/01/2007  . ACNE NEC 02/01/2007  . Bipolar II disorder ([place]) 12/20/2006  . DERMATITIS, OTHER ATOPIC 12/20/2006    Past Surgical History:  Procedure Laterality Date  . INTRAUTERINE DEVICE (IUD) INSERTION  5/13   inserted  . NASAL SEPTUM SURGERY    . OTHER SURGICAL HISTORY     endometrial polyps (2)  . WISDOM TOOTH EXTRACTION      OB History    Gravida Para Term Preterm AB Living   0 0 0 0 0 0   SAB TAB Ectopic Multiple Live Births   0 0 0 0         Home Medications    Prior to Admission medications   Medication Sig Start Date End Date Taking? Authorizing Provider  atorvastatin (LIPITOR) 20 MG tablet TAKE 1 TABLET(20 MG) BY MOUTH DAILY AT 6 PM. REPEAT LABS ARE DUE NOW 07/27/16  Yes Alferd Apa Lowne Chase, DO  ibuprofen (ADVIL,MOTRIN) 200 MG tablet Take 400 mg by mouth every 6 (six) hours as needed.   Yes Historical Provider, MD  lamoTRIgine (LAMICTAL) 150 MG tablet Take 1 tablet (150 mg total) by mouth 2 (two) times daily. Take 1 tablet in the AM, 1 tablet in the PM 08/05/16  Yes Kathlee Nations, MD  levonorgestrel (MIRENA) 20 MCG/24HR IUD 1 Intra Uterine Device (1 each total) by Intrauterine route once. 03/06/12  Yes Yvonne R Lowne Chase, DO  levothyroxine (SYNTHROID, LEVOTHROID) 150 MCG tablet Take 1 tablet (150 mcg total) by mouth daily. Repeat labs are due now 04/30/16  Yes Alferd Apa Lowne Chase, DO  metroNIDAZOLE (METROGEL) 0.75 % vaginal gel Place 1 application vaginally daily as needed for irritation. 08/12/16  Yes Historical Provider, MD  naproxen sodium (ANAPROX) 220 MG tablet Take 220 mg by mouth 2 (two) times daily as needed (pain).  Yes Historical Provider, MD  sertraline (ZOLOFT) 100 MG tablet Take 1 tablet (100 mg total) by mouth daily. 08/05/16  Yes Kathlee Nations, MD  ondansetron (ZOFRAN ODT) 4 MG disintegrating tablet Take 1 tablet (4 mg total) by mouth every 8 (eight) hours as needed for nausea or vomiting. 08/21/16   Nona Dell, PA-C  oxyCODONE-acetaminophen (PERCOCET/ROXICET) 5-325 MG tablet Take 1-2 tablets by mouth every 4 (four) hours as needed for severe pain. 08/21/16   Nona Dell, PA-C  tamsulosin (FLOMAX) 0.4 MG CAPS capsule Take 1 capsule (0.4 mg total) by mouth 2 (two) times daily. 08/21/16   Nona Dell, PA-C     Family History Family History  Problem Relation Age of Onset  . Cancer Mother 50    ovarian  . Hypothyroidism Mother   . Hypertension Father   . Diabetes Father   . Hyperlipidemia Father   . Heart disease Father     cabg--stint  . Migraines Father   . Hypothyroidism Sister   . Depression Sister   . Cancer Maternal Aunt     breast   . Cancer Maternal Uncle     throat  . COPD Maternal Grandmother   . Cancer Maternal Grandmother     lung  . Hypertension Maternal Grandmother   . Depression Maternal Grandmother   . Stroke Maternal Grandmother   . Cancer Maternal Grandfather     prostate, lung  . Diabetes Paternal Grandmother   . Stroke Paternal Grandmother   . Alcohol abuse Paternal Grandfather   . Aneurysm Paternal Grandfather   . Depression Cousin     bipolar    Social History Social History  Substance Use Topics  . Smoking status: Current Every Day Smoker    Packs/day: 1.00    Years: 20.00    Types: Cigarettes  . Smokeless tobacco: Never Used  . Alcohol use No     Allergies   Food   Review of Systems Review of Systems  Gastrointestinal: Positive for abdominal pain, nausea and vomiting.  Genitourinary: Positive for flank pain (left) and urgency.  All other systems reviewed and are negative.    Physical Exam Updated Vital Signs BP 165/89 (BP Location: Right Arm)   Pulse 80   Temp 98.7 F (37.1 C) (Oral)   Resp 20   Ht 5\' 6"  (1.676 m)   Wt 90.7 kg   SpO2 98%   BMI 32.28 kg/m   Physical Exam  Constitutional: She is oriented to person, place, and time. She appears well-developed and well-nourished.  Pt intermittently rolling around in bed, unable to sit still, appears to be in discomfort.  HENT:  Head: Normocephalic and atraumatic.  Mouth/Throat: Uvula is midline, oropharynx is clear and moist and mucous membranes are normal. No oropharyngeal exudate, posterior oropharyngeal edema, posterior oropharyngeal erythema or tonsillar abscesses. No  tonsillar exudate.  Eyes: Conjunctivae and EOM are normal. Right eye exhibits no discharge. Left eye exhibits no discharge. No scleral icterus.  Neck: Normal range of motion. Neck supple.  Cardiovascular: Normal rate, regular rhythm, normal heart sounds and intact distal pulses.   Pulmonary/Chest: Effort normal and breath sounds normal. No respiratory distress. She has no wheezes. She has no rales. She exhibits no tenderness.  Abdominal: Soft. Normal appearance and bowel sounds are normal. She exhibits no distension and no mass. There is tenderness in the left upper quadrant and left lower quadrant. There is CVA tenderness (left). There is no rigidity, no rebound, no guarding and negative  Murphy's sign. No hernia.  Mild diffuse tenderness over left side of abdomen.  Musculoskeletal: She exhibits no edema.  Neurological: She is alert and oriented to person, place, and time.  Skin: Skin is warm and dry. She is not diaphoretic.  Nursing note and vitals reviewed.    ED Treatments / Results  Labs (all labs ordered are listed, but only abnormal results are displayed) Labs Reviewed  COMPREHENSIVE METABOLIC PANEL - Abnormal; Notable for the following:       Result Value   Glucose, Bld 130 (*)    All other components within normal limits  CBC - Abnormal; Notable for the following:    WBC 11.2 (*)    All other components within normal limits  URINALYSIS, ROUTINE W REFLEX MICROSCOPIC - Abnormal; Notable for the following:    APPearance HAZY (*)    Hgb urine dipstick MODERATE (*)    Bacteria, UA RARE (*)    Squamous Epithelial / LPF 0-5 (*)    All other components within normal limits  LIPASE, BLOOD  I-STAT BETA HCG BLOOD, ED ([facility], [facility], [facility] ONLY)    EKG  EKG Interpretation None       Radiology Ct Renal Stone Study  Result Date: 08/21/2016 CLINICAL DATA:  Left-sided abdominal pain/CVA tenderness. EXAM: CT ABDOMEN AND PELVIS WITHOUT CONTRAST TECHNIQUE: Multidetector CT imaging of the  abdomen and pelvis was performed following the standard protocol without IV contrast. COMPARISON:  None. FINDINGS: Lower chest: The lung bases are clear. Hepatobiliary: No focal liver abnormality is seen. No gallstones, gallbladder wall thickening, or biliary dilatation. Pancreas: No ductal dilatation or inflammation. Spleen: Normal in size without focal abnormality. Adrenals/Urinary Tract: Punctate, less than 2 mm, stone at the left ureterovesicular junction with mild left hydronephrosis and perinephric edema. No additional nonobstructing stones in either kidney. No right hydronephrosis. Urinary bladder is decompressed. Low-density subcentimeter nodularity to the left adrenal gland is consistent with adenoma. Right adrenal gland is normal. Stomach/Bowel: Small hiatal hernia. Stomach is decompressed. No evidence of bowel wall thickening, inflammation or obstruction. Normal appendix. Vascular/Lymphatic: Abdominal aorta is normal in caliber. Retroperitoneal, mesenteric or pelvic adenopathy. Reproductive: Intrauterine device appropriately positioned in the uterus, which is retroverted. No adnexal mass. Other: No free air, free fluid, or intra-abdominal fluid collection. Musculoskeletal: Bilateral L5 pars interarticularis defects with trace anterolisthesis of L5 on S1. Mild L5-S1 degenerative disc disease. IMPRESSION: 1. Punctate obstructing stone at the left ureterovesicular junction with mild hydronephrosis and perinephric edema. 2. Small hiatal hernia. 3. Bilateral L5 pars interarticularis defects. Electronically Signed   By: Jeb Levering M.D.   On: 08/21/2016 21:47    Procedures Procedures (including critical care time)  Medications Ordered in ED Medications  sodium chloride 0.9 % bolus 1,000 mL (not administered)  ketorolac (TORADOL) 30 MG/ML injection 15 mg (15 mg Intravenous Given 08/21/16 2059)  morphine 4 MG/ML injection 4 mg (4 mg Intravenous Given 08/21/16 2059)  ondansetron (ZOFRAN) injection 4 mg  (4 mg Intravenous Given 08/21/16 2059)     Initial Impression / Assessment and Plan / ED Course  I have reviewed the triage vital signs and the nursing notes.  Pertinent labs & imaging results that were available during my care of the patient were reviewed by me and considered in my medical decision making (see chart for details).     Patient presents with sudden onset waxing and waning left lower abdominal pain that radiates around to her left flank. Endorses associated nausea and vomiting. Denies fever. VSS. Exam  revealed tenderness over left abdominal side, no peritoneal signs, left CVA tenderness. Remaining exam unremarkable. Patient given IV fluids, pain meds and antiemetics. Pregnancy negative. UA positive for hemoglobin. Remaining labs unremarkable. CT renal study showed punctate obstructing stone at left ureterovesicular junction with mild hydronephrosis. On reevaluation patient is resting in bed comfortably and reports significant improvement of symptoms. Patient able to tolerate PO. Discussed results and plan for discharge with patient. Patient discharged home with pain meds, antiemetics and urology outpatient follow-up as needed. Discussed or return precautions.  Final Clinical Impressions(s) / ED Diagnoses   Final diagnoses:  Kidney stone    New Prescriptions New Prescriptions   ONDANSETRON (ZOFRAN ODT) 4 MG DISINTEGRATING TABLET    Take 1 tablet (4 mg total) by mouth every 8 (eight) hours as needed for nausea or vomiting.   OXYCODONE-ACETAMINOPHEN (PERCOCET/ROXICET) 5-325 MG TABLET    Take 1-2 tablets by mouth every 4 (four) hours as needed for severe pain.   TAMSULOSIN (FLOMAX) 0.4 MG CAPS CAPSULE    Take 1 capsule (0.4 mg total) by mouth 2 (two) times daily.     Chesley Noon [place], [place] 08/21/16 [place], MD 08/21/16 [phone]

## 2016-08-21 NOTE — ED Triage Notes (Signed)
Pt c/o abdominal cramping, vomiting and increase urge to urinate.

## 2016-08-21 NOTE — Discharge Instructions (Signed)
Take your medications as prescribed as needed for pain relief and nausea. Continue drinking fluids at home to remain hydrated.  Call the urology clinic listed below if your symptoms have not improved or have worsened over the next 2-3 days.  Please return to the Emergency Department if symptoms worsen or new onset of fever, new/worsening abdomina/back pain, vomiting, unable to keep fluids down, difficulty urinating, chest pain, difficulty breathing.

## 2016-08-26 ENCOUNTER — Other Ambulatory Visit: Payer: Self-pay | Admitting: Family Medicine

## 2016-09-29 ENCOUNTER — Other Ambulatory Visit: Payer: Self-pay | Admitting: Family Medicine

## 2016-09-29 DIAGNOSIS — E039 Hypothyroidism, unspecified: Secondary | ICD-10-CM

## 2016-10-18 ENCOUNTER — Telehealth: Payer: Self-pay | Admitting: Certified Nurse Midwife

## 2016-10-18 NOTE — Telephone Encounter (Signed)
Patient cancelled procedure appointment for Wednesday and would like nurse to call so she can reschedule.

## 2016-10-18 NOTE — Telephone Encounter (Signed)
Spoke with patient. Patient rescheduled for IUD removal on 11/02/16 at 2pm with Melvia Heaps, CNM. Patient verbalizes understanding and is agreeable to date and time.  Routing to provider for final review. Patient is agreeable to disposition. Will close encounter.  Cc: Theresia Lo

## 2016-10-20 ENCOUNTER — Ambulatory Visit: Payer: 59 | Admitting: Certified Nurse Midwife

## 2016-11-01 ENCOUNTER — Other Ambulatory Visit: Payer: Self-pay | Admitting: Family Medicine

## 2016-11-01 DIAGNOSIS — E039 Hypothyroidism, unspecified: Secondary | ICD-10-CM

## 2016-11-02 ENCOUNTER — Encounter: Payer: Self-pay | Admitting: Certified Nurse Midwife

## 2016-11-02 ENCOUNTER — Ambulatory Visit (INDEPENDENT_AMBULATORY_CARE_PROVIDER_SITE_OTHER): Payer: 59 | Admitting: Certified Nurse Midwife

## 2016-11-02 ENCOUNTER — Ambulatory Visit (HOSPITAL_COMMUNITY): Payer: 59 | Admitting: Psychiatry

## 2016-11-02 ENCOUNTER — Encounter (HOSPITAL_COMMUNITY): Payer: Self-pay | Admitting: Psychiatry

## 2016-11-02 VITALS — BP 100/66 | HR 72 | Resp 16 | Ht 65.75 in | Wt 195.0 lb

## 2016-11-02 DIAGNOSIS — Z811 Family history of alcohol abuse and dependence: Secondary | ICD-10-CM | POA: Diagnosis not present

## 2016-11-02 DIAGNOSIS — F17210 Nicotine dependence, cigarettes, uncomplicated: Secondary | ICD-10-CM | POA: Diagnosis not present

## 2016-11-02 DIAGNOSIS — Z79899 Other long term (current) drug therapy: Secondary | ICD-10-CM | POA: Diagnosis not present

## 2016-11-02 DIAGNOSIS — F3181 Bipolar II disorder: Secondary | ICD-10-CM

## 2016-11-02 DIAGNOSIS — Z789 Other specified health status: Secondary | ICD-10-CM

## 2016-11-02 DIAGNOSIS — Z30432 Encounter for removal of intrauterine contraceptive device: Secondary | ICD-10-CM | POA: Diagnosis not present

## 2016-11-02 DIAGNOSIS — F419 Anxiety disorder, unspecified: Secondary | ICD-10-CM | POA: Diagnosis not present

## 2016-11-02 DIAGNOSIS — Z818 Family history of other mental and behavioral disorders: Secondary | ICD-10-CM | POA: Diagnosis not present

## 2016-11-02 DIAGNOSIS — Z30011 Encounter for initial prescription of contraceptive pills: Secondary | ICD-10-CM

## 2016-11-02 MED ORDER — NORETHINDRONE 0.35 MG PO TABS: 1.0000 | ORAL_TABLET | Freq: Every day | ORAL | 3 refills | Status: DC

## 2016-11-02 MED ORDER — LAMOTRIGINE 150 MG PO TABS: 150.0000 mg | ORAL_TABLET | Freq: Two times a day (BID) | ORAL | 2 refills | Status: DC

## 2016-11-02 MED ORDER — SERTRALINE HCL 100 MG PO TABS: 100.0000 mg | ORAL_TABLET | Freq: Every day | ORAL | 2 refills | Status: DC

## 2016-11-02 NOTE — Progress Notes (Signed)
[facility] MD/PA/NP OP Progress Note  11/02/2016 4:28 PM Cheryl [name]  MRN:  [id number]  Chief Complaint:  Subjective:  I am doing fine.  However I was very nervous 3 weeks ago when my cat died.  HPI: Patient came for her follow-up appointment.  She is taking Lamictal and Zoloft every day.  3 weeks ago she was very nervous and anxious and having panic attack when her cat was sick and eventually died.  She admitted having crying spells but then she handled herself much better.  She sleeping good.  She denies any irritability, anger, mania or any psychosis.  She is excited because she is getting married with her boyfriend Quillian Quince in September.  Patient lives by herself but she is very close to her sister and mother who are very supportive.  Patient has no rash, itching, tremors or shakes.  She denies drinking alcohol or using any illegal substances.  Visit Diagnosis:    ICD-9-CM ICD-10-CM   1. Bipolar 2 disorder, major depressive episode (HCC) 296.89 F31.81 sertraline (ZOLOFT) 100 MG tablet     lamoTRIgine (LAMICTAL) 150 MG tablet    Past Psychiatric History: Reviewed. Patient reported having depression when she was studying at [employer] state [employer]. She took Prozac and Effexor in the past. She did not like Prozac and she had withdrawals from Effexor. She was diagnosed bipolar disorder few years ago when she remember having mania , mood swing and anger issues. Since then she is taking Lamictal . Patient denies any history of psychiatric inpatient treatment, suicidal attempt but admitted history of fleeting and suicidal thoughts . Patient denies any self abusive behavior. She was seeing Jobie Quaker at [facility] but not happy with the services.   Past Medical History:  Past Medical History:  Diagnosis Date  . Anxiety   . Bipolar 1 disorder ([place])   . Bipolar 2 disorder ([place])   . Depression   . Diabetes mellitus without complication ([place])   . Kidney stone   . Migraines    with  aura  . OSA (obstructive sleep apnea) 10/17/2013  . Thyroid disease     Past Surgical History:  Procedure Laterality Date  . INTRAUTERINE DEVICE (IUD) INSERTION  5/13   inserted  . NASAL SEPTUM SURGERY    . OTHER SURGICAL HISTORY     endometrial polyps (2)  . WISDOM TOOTH EXTRACTION      Family Psychiatric History: Reviewed.  Family History:  Family History  Problem Relation Age of Onset  . Cancer Mother 10       ovarian  . Hypothyroidism Mother   . Hypertension Father   . Diabetes Father   . Hyperlipidemia Father   . Heart disease Father        cabg--stint  . Migraines Father   . Hypothyroidism Sister   . Depression Sister   . Cancer Maternal Aunt        breast   . Cancer Maternal Uncle        throat  . COPD Maternal Grandmother   . Cancer Maternal Grandmother        lung  . Hypertension Maternal Grandmother   . Depression Maternal Grandmother   . Stroke Maternal Grandmother   . Cancer Maternal Grandfather        prostate, lung  . Diabetes Paternal Grandmother   . Stroke Paternal Grandmother   . Alcohol abuse Paternal Grandfather   . Aneurysm Paternal Grandfather   . Depression Cousin  bipolar    Social History:  Social History   Social History  . Marital status: Single    Spouse name: N/A  . Number of children: N/A  . Years of education: N/A   Occupational History  . [employer]   Social History Main Topics  . Smoking status: Current Every Day Smoker    Packs/day: 1.00    Years: 20.00    Types: Cigarettes  . Smokeless tobacco: Never Used  . Alcohol use No  . Drug use: No  . Sexual activity: Yes    Partners: Male    Birth control/ protection: IUD   Other Topics Concern  . Not on file   Social History Narrative   Exercise-- no    Allergies:  Allergies  Allergen Reactions  . Food Itching    Bison-throat tingling    Metabolic Disorder Labs: Lab Results  Component Value Date   HGBA1C 6.2  04/22/2016   No results found for: PROLACTIN Lab Results  Component Value Date   CHOL 109 04/22/2016   TRIG 111.0 04/22/2016   HDL 35.50 (L) 04/22/2016   CHOLHDL 3 04/22/2016   VLDL 22.2 04/22/2016   LDLCALC 52 04/22/2016   LDLCALC 206 (H) 09/28/2013     Current Medications: Current Outpatient Prescriptions  Medication Sig Dispense Refill  . clonazePAM (KLONOPIN) 0.5 MG tablet   0  . ibuprofen (ADVIL,MOTRIN) 200 MG tablet Take 400 mg by mouth every 6 (six) hours as needed.    . lamoTRIgine (LAMICTAL) 150 MG tablet Take 1 tablet (150 mg total) by mouth 2 (two) times daily. Take 1 tablet in the AM, 1 tablet in the PM 60 tablet 2  . levonorgestrel (MIRENA) 20 MCG/24HR IUD 1 Intra Uterine Device (1 each total) by Intrauterine route once. 1 each 0  . levothyroxine (SYNTHROID, LEVOTHROID) 150 MCG tablet Take 1 tablet (150 mcg total) by mouth daily. Repeat labs are due now 30 tablet 2  . naproxen sodium (ANAPROX) 220 MG tablet Take 220 mg by mouth 2 (two) times daily as needed (pain).    . norethindrone (MICRONOR,CAMILA,ERRIN) 0.35 MG tablet Take 1 tablet (0.35 mg total) by mouth daily. 3 Package 3  . sertraline (ZOLOFT) 100 MG tablet Take 1 tablet (100 mg total) by mouth daily. 30 tablet 2   No current facility-administered medications for this visit.     Neurologic: Headache: No Seizure: No Paresthesias: No  Musculoskeletal: Strength & Muscle Tone: within normal limits Gait & Station: normal Patient leans: N/A  Psychiatric Specialty Exam: ROS  Blood pressure 129/79, pulse 90, height 5' 5.5" (1.664 m), weight 195 lb (88.5 kg).Body mass index is 31.96 kg/m.  General Appearance: Casual  Eye Contact:  Good  Speech:  Clear and Coherent  Volume:  Normal  Mood:  Euthymic  Affect:  Congruent  Thought Process:  Goal Directed  Orientation:  Full (Time, Place, and Person)  Thought Content: WDL and Logical   Suicidal Thoughts:  No  Homicidal Thoughts:  No  Memory:  Immediate;    Good Recent;   Good Remote;   Good  Judgement:  Good  Insight:  Good  Psychomotor Activity:  Normal  Concentration:  Concentration: Good and Attention Span: Good  Recall:  Good  Fund of Knowledge: Good  Language: Good  Akathisia:  No  Handed:  Right  AIMS (if indicated):  0  Assets:  Communication Skills [employer]ents/Skills Transportation  ADL's:  Intact  Cognition: WNL  Sleep:  Good     Assessment: Bipolar 2 disorder.  Anxiety disorder NOS.  Plan: Patient is doing better on her current psychiatric medication.  She rarely takes Klonopin but like to have it if needed.  She wants to continue Zoloft and Lamictal.  I will continue Zoloft 100 mg daily and Lamictal 150 mg twice a day.  Discussed medication side effects and benefits.  Recommended to call us back if she has any question, concern or if she feels worsening of the symptom.  Follow-up in 3 months.  Kadince Boxley T., MD 11/02/2016, 4:28 PM

## 2016-11-02 NOTE — Progress Notes (Signed)
12 yrs Caucasian Single [id number]. Presents for Mirena IUD removal.  Denies any vaginal symptoms or STD concerns.  Plans for contraception are progesterone only pills. Patient getting married later this summer and plans to try for pregnancy later in the year. Would like to have Rubella status drawn today as discussed at aex. No other health concerns today.  LMP 08/19/16     HPI pertinent to HPI  O: Healthy WDWN female Affect normal, Orientation x 3  Exam: Skin warm and dry Abdomen: soft non-tender Groin:no inguinal nodes palpated    Pelvic exam:Pelvic exam: normal external genitalia, vulva, vagina, cervix, uterus and adnexa, VULVA: normal appearing vulva with no masses, tenderness or lesions, VAGINA: normal appearing vagina with normal color and discharge, no lesions, CERVIX: normal appearing cervix without discharge or lesions, IUD string noted in cervix, UTERUS: uterus is normal size, shape, consistency and nontender, ADNEXA: normal adnexa in size, nontender and no masses.  Procedure: Speculum placed, cervix visualized cleansed with Betadine x 3.  IUD string visualized, grasp with ring forceps, with gentle traction IUD removed intact.  IUD shown to patient and discarded. No bleeding noted. Speculum removed.   Assessment:Mirena IUD removal Pt tolerated procedure well. Rubella immune status Planning for pregnancy later in year  Plan: Discussed risked/benefits of POP, bleeding profile expectations. Discussed very similar to IUD. Discussed may not resume period with use and not a concern if no missed pills. Discussed warning signs and need to advise. Rx Camilla see order with instructions. Recommended to start tomorrow. Plans abstinence until after marriage. Patient planned lab today Lab: Rubella Will call for referral to MFM once they decide when they want to try to conceive. Also will need to work with Psychiatrist with medication change prior to pregnancy. Recommend at least 2 months prior.  Questions addressed. Wished well with marriage  Rv prn,aex Questions addressed

## 2016-11-03 LAB — RUBELLA SCREEN: RUBELLA: 4.4 {index} — AB (ref <0.90)

## 2016-12-01 ENCOUNTER — Other Ambulatory Visit: Payer: Self-pay | Admitting: Family Medicine

## 2016-12-01 DIAGNOSIS — E039 Hypothyroidism, unspecified: Secondary | ICD-10-CM

## 2016-12-16 ENCOUNTER — Ambulatory Visit: Payer: 59 | Admitting: Family Medicine

## 2016-12-20 ENCOUNTER — Encounter: Payer: Self-pay | Admitting: Family Medicine

## 2017-01-01 ENCOUNTER — Other Ambulatory Visit: Payer: Self-pay | Admitting: Family Medicine

## 2017-01-01 DIAGNOSIS — E039 Hypothyroidism, unspecified: Secondary | ICD-10-CM

## 2017-01-07 ENCOUNTER — Other Ambulatory Visit: Payer: Self-pay | Admitting: Emergency Medicine

## 2017-01-07 ENCOUNTER — Encounter: Payer: Self-pay | Admitting: Family Medicine

## 2017-01-07 ENCOUNTER — Ambulatory Visit (INDEPENDENT_AMBULATORY_CARE_PROVIDER_SITE_OTHER): Payer: 59 | Admitting: Family Medicine

## 2017-01-07 VITALS — BP 110/70 | HR 81 | Temp 98.4°F | Resp 16 | Ht 66.0 in | Wt 194.6 lb

## 2017-01-07 DIAGNOSIS — E1151 Type 2 diabetes mellitus with diabetic peripheral angiopathy without gangrene: Secondary | ICD-10-CM | POA: Diagnosis not present

## 2017-01-07 DIAGNOSIS — F3181 Bipolar II disorder: Secondary | ICD-10-CM

## 2017-01-07 DIAGNOSIS — E1165 Type 2 diabetes mellitus with hyperglycemia: Secondary | ICD-10-CM

## 2017-01-07 DIAGNOSIS — E785 Hyperlipidemia, unspecified: Secondary | ICD-10-CM | POA: Diagnosis not present

## 2017-01-07 DIAGNOSIS — Z23 Encounter for immunization: Secondary | ICD-10-CM

## 2017-01-07 DIAGNOSIS — E039 Hypothyroidism, unspecified: Secondary | ICD-10-CM | POA: Diagnosis not present

## 2017-01-07 DIAGNOSIS — IMO0002 Reserved for concepts with insufficient information to code with codable children: Secondary | ICD-10-CM

## 2017-01-07 LAB — LIPID PANEL
Cholesterol: 184 mg/dL (ref 0–200)
HDL: 38.5 mg/dL — AB (ref >39.00)
LDL Cholesterol: 115 mg/dL — ABNORMAL HIGH (ref 0–99)
NONHDL: 145.69
TRIGLYCERIDES: 155 mg/dL — AB (ref 0.0–149.0)
Total CHOL/HDL Ratio: 5
VLDL: 31 mg/dL (ref 0.0–40.0)

## 2017-01-07 LAB — COMPREHENSIVE METABOLIC PANEL
ALT: 15 U/L (ref 0–35)
AST: 13 U/L (ref 0–37)
Albumin: 4.5 g/dL (ref 3.5–5.2)
Alkaline Phosphatase: 64 U/L (ref 39–117)
BILIRUBIN TOTAL: 0.5 mg/dL (ref 0.2–1.2)
BUN: 16 mg/dL (ref 6–23)
CALCIUM: 9.6 mg/dL (ref 8.4–10.5)
CO2: 30 meq/L (ref 19–32)
Chloride: 103 mEq/L (ref 96–112)
Creatinine, Ser: 0.89 mg/dL (ref 0.40–1.20)
GFR: 74.25 mL/min (ref >60.00)
GLUCOSE: 100 mg/dL — AB (ref 70–99)
POTASSIUM: 4.2 meq/L (ref 3.5–5.1)
Sodium: 137 mEq/L (ref 135–145)
Total Protein: 7.4 g/dL (ref 6.0–8.3)

## 2017-01-07 LAB — TSH: TSH: 3.54 u[IU]/mL (ref 0.35–4.50)

## 2017-01-07 LAB — HEMOGLOBIN A1C: HEMOGLOBIN A1C: 5.8 % (ref 4.6–6.5)

## 2017-01-07 MED ORDER — LEVOTHYROXINE SODIUM 150 MCG PO TABS: 175.0000 ug | ORAL_TABLET | Freq: Every day | ORAL | 3 refills | Status: DC

## 2017-01-07 NOTE — Assessment & Plan Note (Signed)
Per psych 

## 2017-01-07 NOTE — Assessment & Plan Note (Signed)
hgba1c to be done, minimize simple carbs. Increase exercise as tolerated. Continue current meds  

## 2017-01-07 NOTE — Assessment & Plan Note (Signed)
Check tsh On synthroid  

## 2017-01-07 NOTE — Addendum Note (Signed)
Addended by: Peggyann Shoals on: 01/07/2017 09:50 AM   Modules accepted: Orders

## 2017-01-07 NOTE — Patient Instructions (Signed)

## 2017-01-07 NOTE — Assessment & Plan Note (Signed)
Tolerating statin, encouraged heart healthy diet, avoid trans fats, minimize simple carbs and saturated fats. Increase exercise as tolerated 

## 2017-01-07 NOTE — Progress Notes (Signed)
Patient ID: Cheryl [name], female   DOB: Jun 30, 1975, 41 y.o.   MRN: [id number]     Subjective:  I acted as a Education administrator for Dr. Carollee Herter.  Guerry Bruin, [place]   Patient ID: Cheryl [name], female    DOB: [birth year], 41 y.o.   MRN: [id number]  Chief Complaint  Patient presents with  . Diabetes  . Hypothyroidism    HPI  Patient is in today for follow up diabetes and thyroid.  She has not been doing so well with checking sugars and diet. Pt also needs f/u thyroid   HPI HYPERTENSION   Blood pressure range-not checking   Chest pain- no      Dyspnea- no Lightheadedness- no   Edema- no  Other side effects - no   Medication compliance: good Low salt diet- yes    DIABETES    Blood Sugar ranges-not checking   Polyuria- no New Visual problems- no  Hypoglycemic symptoms- no  Other side effects-no Medication compliance - good Last eye exam- due Foot exam- today   HYPERLIPIDEMIA  Medication compliance- good RUQ pain- no  Muscle aches- no Other side effects-no     Patient Care Team: Ann Held, DO as PCP - General (Family Medicine) Adele Schilder, Arlyce Harman, MD as Consulting Physician (Psychiatry) Regina Eck, CNM as Referring Physician (Certified Nurse Midwife)   Past Medical History:  Diagnosis Date  . Anxiety   . Bipolar 1 disorder ([place])   . Bipolar 2 disorder ([place])   . Depression   . Diabetes mellitus without complication ([place])   . Kidney stone   . Migraines    with aura  . OSA (obstructive sleep apnea) 10/17/2013  . Thyroid disease     Past Surgical History:  Procedure Laterality Date  . INTRAUTERINE DEVICE (IUD) INSERTION  5/13   inserted  . NASAL SEPTUM SURGERY    . OTHER SURGICAL HISTORY     endometrial polyps (2)  . WISDOM TOOTH EXTRACTION      Family History  Problem Relation Age of Onset  . Cancer Mother 26       ovarian  . Hypothyroidism Mother   . Hypertension Father   . Diabetes Father   . Hyperlipidemia Father   . Heart  disease Father        cabg--stint  . Migraines Father   . Hypothyroidism Sister   . Depression Sister   . Cancer Maternal Aunt        breast   . Cancer Maternal Uncle        throat  . COPD Maternal Grandmother   . Cancer Maternal Grandmother        lung  . Hypertension Maternal Grandmother   . Depression Maternal Grandmother   . Stroke Maternal Grandmother   . Cancer Maternal Grandfather        prostate, lung  . Diabetes Paternal Grandmother   . Stroke Paternal Grandmother   . Alcohol abuse Paternal Grandfather   . Aneurysm Paternal Grandfather   . Depression Cousin        bipolar    Social History   Social History  . Marital status: Single    Spouse name: N/A  . Number of children: N/A  . Years of education: N/A   Occupational History  . [employer]   Social History Main Topics  . Smoking status: Current Every Day Smoker    Packs/day: 1.00    Years: 20.00  Types: Cigarettes  . Smokeless tobacco: Never Used  . Alcohol use No  . Drug use: No  . Sexual activity: Yes    Partners: Male    Birth control/ protection: IUD   Other Topics Concern  . Not on file   Social History Narrative   Exercise-- no    Outpatient Medications Prior to Visit  Medication Sig Dispense Refill  . clonazePAM (KLONOPIN) 0.5 MG tablet   0  . ibuprofen (ADVIL,MOTRIN) 200 MG tablet Take 400 mg by mouth every 6 (six) hours as needed.    . lamoTRIgine (LAMICTAL) 150 MG tablet Take 1 tablet (150 mg total) by mouth 2 (two) times daily. Take 1 tablet in the AM, 1 tablet in the PM 60 tablet 2  . naproxen sodium (ANAPROX) 220 MG tablet Take 220 mg by mouth 2 (two) times daily as needed (pain).    Marland Kitchen sertraline (ZOLOFT) 100 MG tablet Take 1 tablet (100 mg total) by mouth daily. 30 tablet 2  . levothyroxine (SYNTHROID, LEVOTHROID) 150 MCG tablet Take 1 tablet (150 mcg total) by mouth daily. Repeat labs are due now 30 tablet 2  . levonorgestrel (MIRENA) 20  MCG/24HR IUD 1 Intra Uterine Device (1 each total) by Intrauterine route once. 1 each 0  . levothyroxine (SYNTHROID, LEVOTHROID) 150 MCG tablet TAKE 1 TABLET BY MOUTH DAILY 30 tablet 0  . norethindrone (MICRONOR,CAMILA,ERRIN) 0.35 MG tablet Take 1 tablet (0.35 mg total) by mouth daily. 3 Package 3   No facility-administered medications prior to visit.     Allergies  Allergen Reactions  . Food Itching    Bison-throat tingling    Review of Systems  Constitutional: Negative for fever and malaise/fatigue.  HENT: Negative for congestion.   Eyes: Negative for blurred vision.  Respiratory: Negative for cough and shortness of breath.   Cardiovascular: Negative for chest pain, palpitations and leg swelling.  Gastrointestinal: Negative for vomiting.  Musculoskeletal: Negative for back pain.  Skin: Negative for rash.  Neurological: Negative for loss of consciousness and headaches.       Objective:    Physical Exam  Constitutional: She is oriented to person, place, and time. She appears well-developed and well-nourished. No distress.  HENT:  Head: Normocephalic and atraumatic.  Eyes: Conjunctivae are normal.  Neck: Normal range of motion. No thyromegaly present.  Cardiovascular: Normal rate and regular rhythm.   Pulmonary/Chest: Effort normal and breath sounds normal. She has no wheezes.  Abdominal: Soft. Bowel sounds are normal. There is no tenderness.  Musculoskeletal: Normal range of motion. She exhibits no edema or deformity.  Neurological: She is alert and oriented to person, place, and time.  Skin: Skin is warm and dry. She is not diaphoretic.  Psychiatric: She has a normal mood and affect.    BP 110/70 (BP Location: Left Arm, Cuff Size: Normal)   Pulse 81   Temp 98.4 F (36.9 C) (Oral)   Resp 16   Ht 5\' 6"  (1.676 m)   Wt 194 lb 9.6 oz (88.3 kg)   LMP 01/07/2017   SpO2 97%   BMI 31.41 kg/m  Wt Readings from Last 3 Encounters:  01/07/17 194 lb 9.6 oz (88.3 kg)    11/02/16 195 lb (88.5 kg)  08/21/16 200 lb (90.7 kg)   BP Readings from Last 3 Encounters:  01/07/17 110/70  11/02/16 100/66  08/21/16 (!) 100/52     Immunization History  Administered Date(s) Administered  . Tdap 03/06/2004, 08/04/2016    Health Maintenance  Topic  Date Due  . PNEUMOCOCCAL POLYSACCHARIDE VACCINE (1) 12/03/1977  . OPHTHALMOLOGY EXAM  12/03/1985  . URINE MICROALBUMIN  07/31/2016  . HEMOGLOBIN A1C  10/20/2016  . HIV Screening  01/08/2028 (Originally 12/04/1990)  . INFLUENZA VACCINE  01/19/2017  . FOOT EXAM  01/07/2018  . PAP SMEAR  08/05/2019  . TETANUS/TDAP  08/04/2026    Lab Results  Component Value Date   WBC 11.2 (H) 08/21/2016   HGB 14.3 08/21/2016   HCT 42.7 08/21/2016   PLT 227 08/21/2016   GLUCOSE 130 (H) 08/21/2016   CHOL 109 04/22/2016   TRIG 111.0 04/22/2016   HDL 35.50 (L) 04/22/2016   LDLDIRECT 136.0 11/19/2015   LDLCALC 52 04/22/2016   ALT 20 08/21/2016   AST 19 08/21/2016   NA 137 08/21/2016   K 3.9 08/21/2016   CL 102 08/21/2016   CREATININE 0.93 08/21/2016   BUN 15 08/21/2016   CO2 23 08/21/2016   TSH 11.31 (H) 04/22/2016   HGBA1C 6.2 04/22/2016   MICROALBUR 5.5 (H) 08/01/2015    Lab Results  Component Value Date   TSH 11.31 (H) 04/22/2016   Lab Results  Component Value Date   WBC 11.2 (H) 08/21/2016   HGB 14.3 08/21/2016   HCT 42.7 08/21/2016   MCV 93.0 08/21/2016   PLT 227 08/21/2016   Lab Results  Component Value Date   NA 137 08/21/2016   K 3.9 08/21/2016   CO2 23 08/21/2016   GLUCOSE 130 (H) 08/21/2016   BUN 15 08/21/2016   CREATININE 0.93 08/21/2016   BILITOT 0.4 08/21/2016   ALKPHOS 75 08/21/2016   AST 19 08/21/2016   ALT 20 08/21/2016   PROT 7.6 08/21/2016   ALBUMIN 4.5 08/21/2016   CALCIUM 10.0 08/21/2016   ANIONGAP 12 08/21/2016   GFR 85.51 04/22/2016   Lab Results  Component Value Date   CHOL 109 04/22/2016   Lab Results  Component Value Date   HDL 35.50 (L) 04/22/2016   Lab Results   Component Value Date   LDLCALC 52 04/22/2016   Lab Results  Component Value Date   TRIG 111.0 04/22/2016   Lab Results  Component Value Date   CHOLHDL 3 04/22/2016   Lab Results  Component Value Date   HGBA1C 6.2 04/22/2016         Assessment & Plan:   Problem List Items Addressed This Visit      Unprioritized   Bipolar II disorder ([place])    Per psych      DM (diabetes mellitus) type II uncontrolled, periph vascular disorder ([place]) - Primary    hgba1c to be done, minimize simple carbs. Increase exercise as tolerated. Continue current meds       Relevant Orders   Hemoglobin A1c   Comprehensive metabolic panel   Lipid panel   TSH   Microalbumin / creatinine urine ratio   Hyperlipidemia LDL goal <70    Tolerating statin, encouraged heart healthy diet, avoid trans fats, minimize simple carbs and saturated fats. Increase exercise as tolerated      Relevant Orders   Hemoglobin A1c   Comprehensive metabolic panel   Lipid panel   TSH   Microalbumin / creatinine urine ratio   Hypothyroidism    Check tsh On synthroid      Relevant Medications   levothyroxine (SYNTHROID, LEVOTHROID) 150 MCG tablet      I have discontinued Ms. Stanfill's levonorgestrel. I am also having her maintain her naproxen sodium, ibuprofen, clonazePAM, sertraline, lamoTRIgine, norethindrone, ONETOUCH  VERIO, and levothyroxine.  Meds ordered this encounter  Medications  . norethindrone (NORLYDA) 0.35 MG tablet    Sig: Take 1 tablet by mouth daily.  Glory Rosebush VERIO test strip  . levothyroxine (SYNTHROID, LEVOTHROID) 150 MCG tablet    Sig: Take 1 tablet (150 mcg total) by mouth daily. Repeat labs are due now    Dispense:  90 tablet    Refill:  3    CMA served as scribe during this visit. History, Physical and Plan performed by medical provider. Documentation and orders reviewed and attested to.  Ann Held, DO

## 2017-01-12 MED ORDER — SIMVASTATIN 20 MG PO TABS: 20.0000 mg | ORAL_TABLET | Freq: Every day | ORAL | 2 refills | Status: DC

## 2017-01-12 NOTE — Addendum Note (Signed)
Addended byDamita Dunnings D on: 01/12/2017 11:24 AM   Modules accepted: Orders

## 2017-01-16 ENCOUNTER — Other Ambulatory Visit: Payer: Self-pay | Admitting: Family Medicine

## 2017-01-16 DIAGNOSIS — E1151 Type 2 diabetes mellitus with diabetic peripheral angiopathy without gangrene: Secondary | ICD-10-CM

## 2017-01-26 ENCOUNTER — Ambulatory Visit (INDEPENDENT_AMBULATORY_CARE_PROVIDER_SITE_OTHER): Payer: 59 | Admitting: Family Medicine

## 2017-01-26 ENCOUNTER — Ambulatory Visit (HOSPITAL_BASED_OUTPATIENT_CLINIC_OR_DEPARTMENT_OTHER)
Admission: RE | Admit: 2017-01-26 | Discharge: 2017-01-26 | Disposition: A | Discharge status: Home / Self Care | Payer: 59 | Source: Ambulatory Visit | Attending: Family Medicine | Admitting: Family Medicine

## 2017-01-26 VITALS — BP 106/76 | HR 79 | Temp 98.9°F | Wt 196.8 lb

## 2017-01-26 DIAGNOSIS — S9032XA Contusion of left foot, initial encounter: Secondary | ICD-10-CM | POA: Insufficient documentation

## 2017-01-26 DIAGNOSIS — X58XXXA Exposure to other specified factors, initial encounter: Secondary | ICD-10-CM | POA: Diagnosis not present

## 2017-01-26 DIAGNOSIS — M79672 Pain in left foot: Secondary | ICD-10-CM | POA: Diagnosis not present

## 2017-01-26 IMAGING — DX DG FOOT COMPLETE 3+V*L*
3 series · 3 of 3 positions shown · non-contrast
Comparison: None.

CLINICAL DATA: Left foot pain after crush injury

EXAM:
LEFT FOOT - COMPLETE 3+ VIEW

[foot ap]
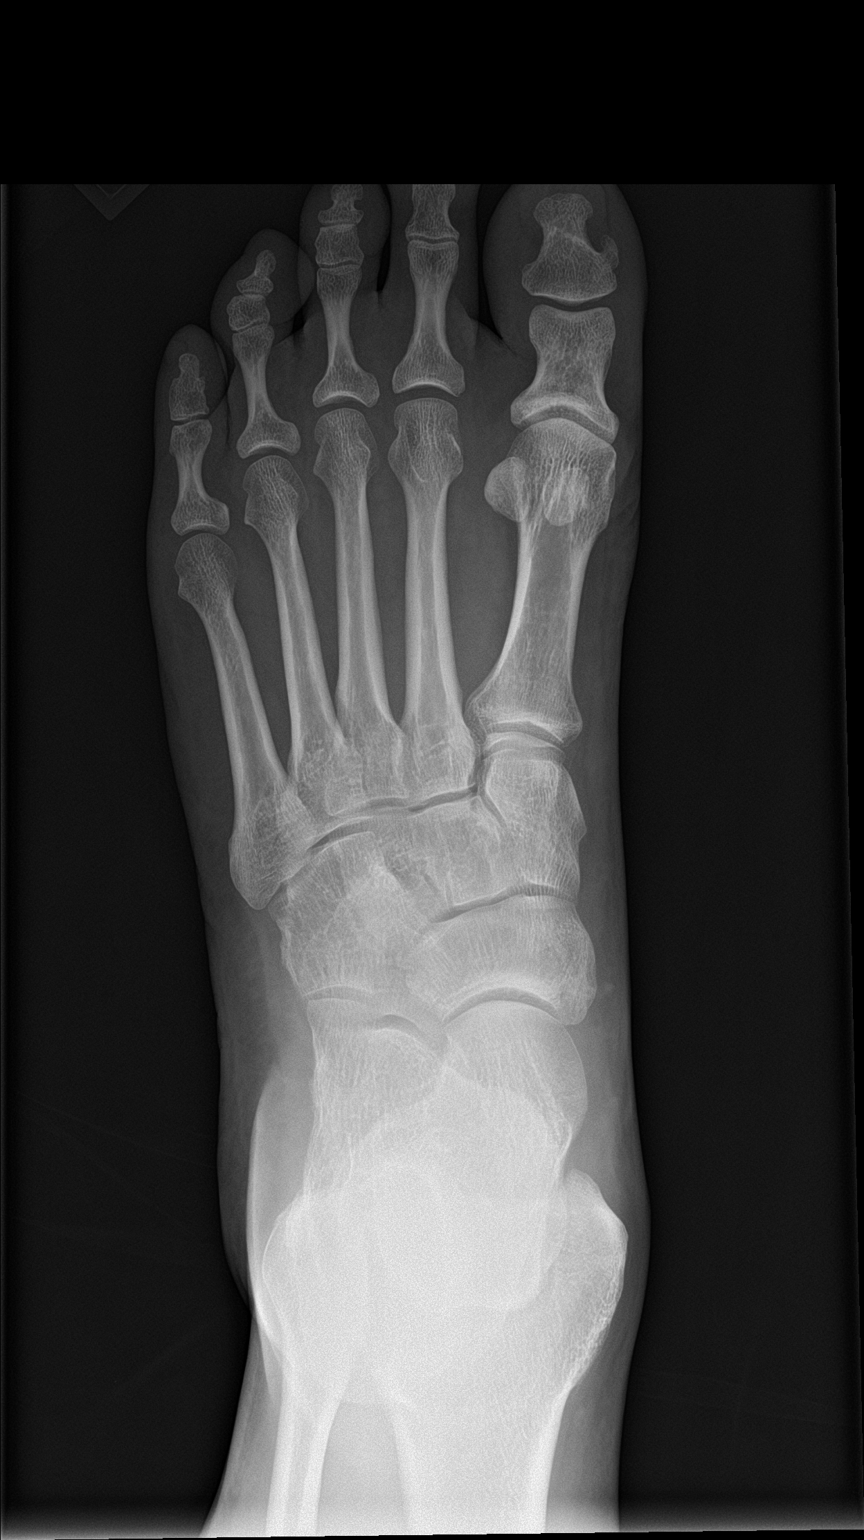

[foot obl]
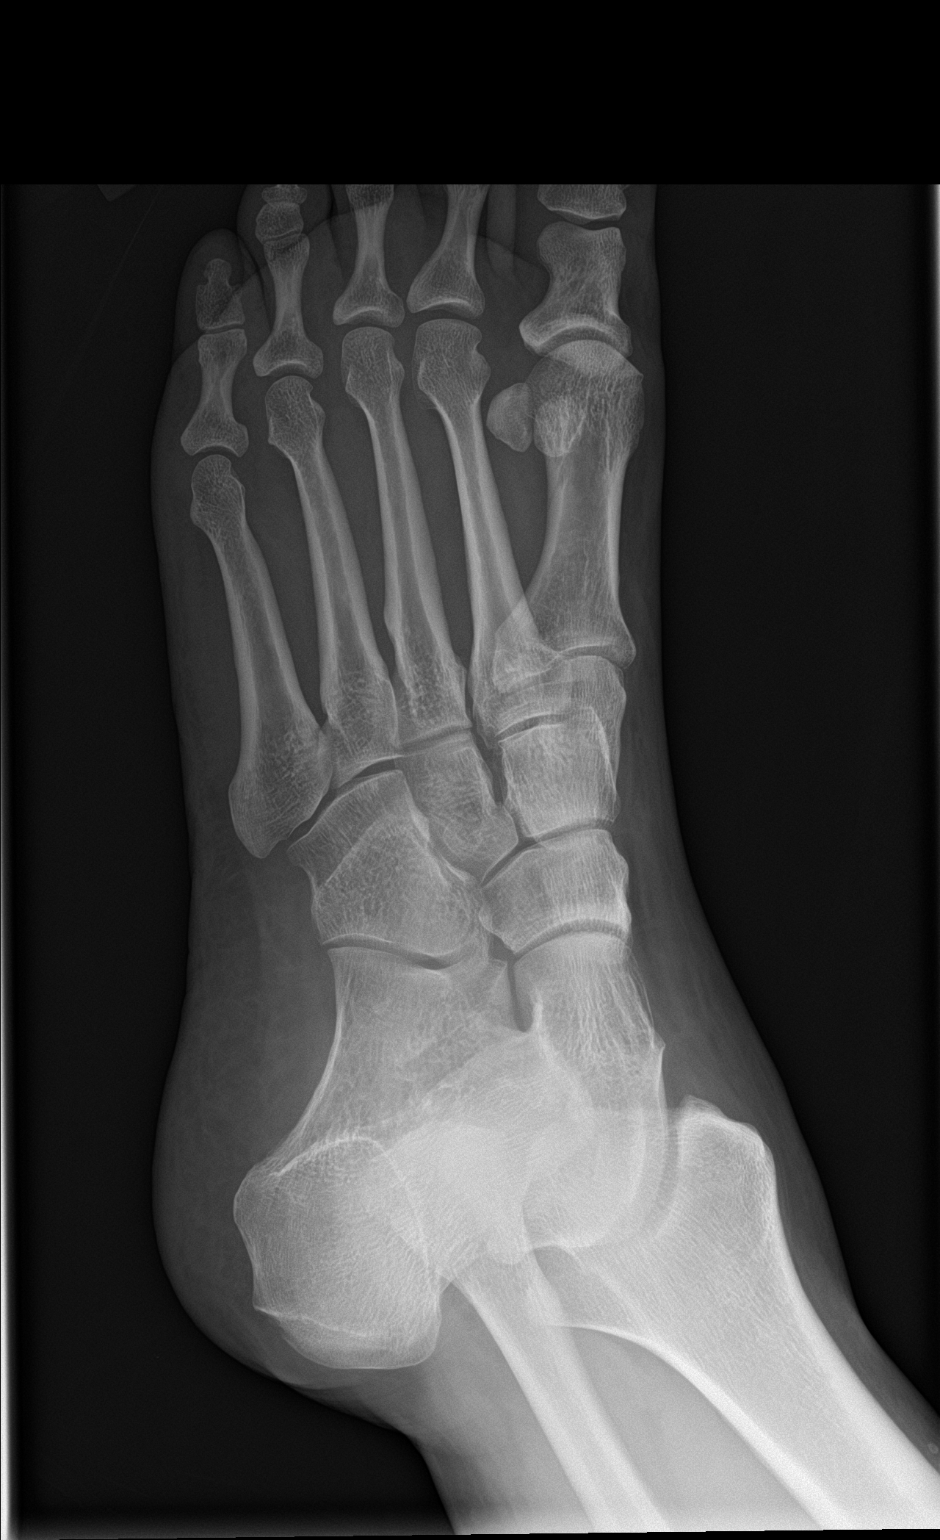

[foot lat]
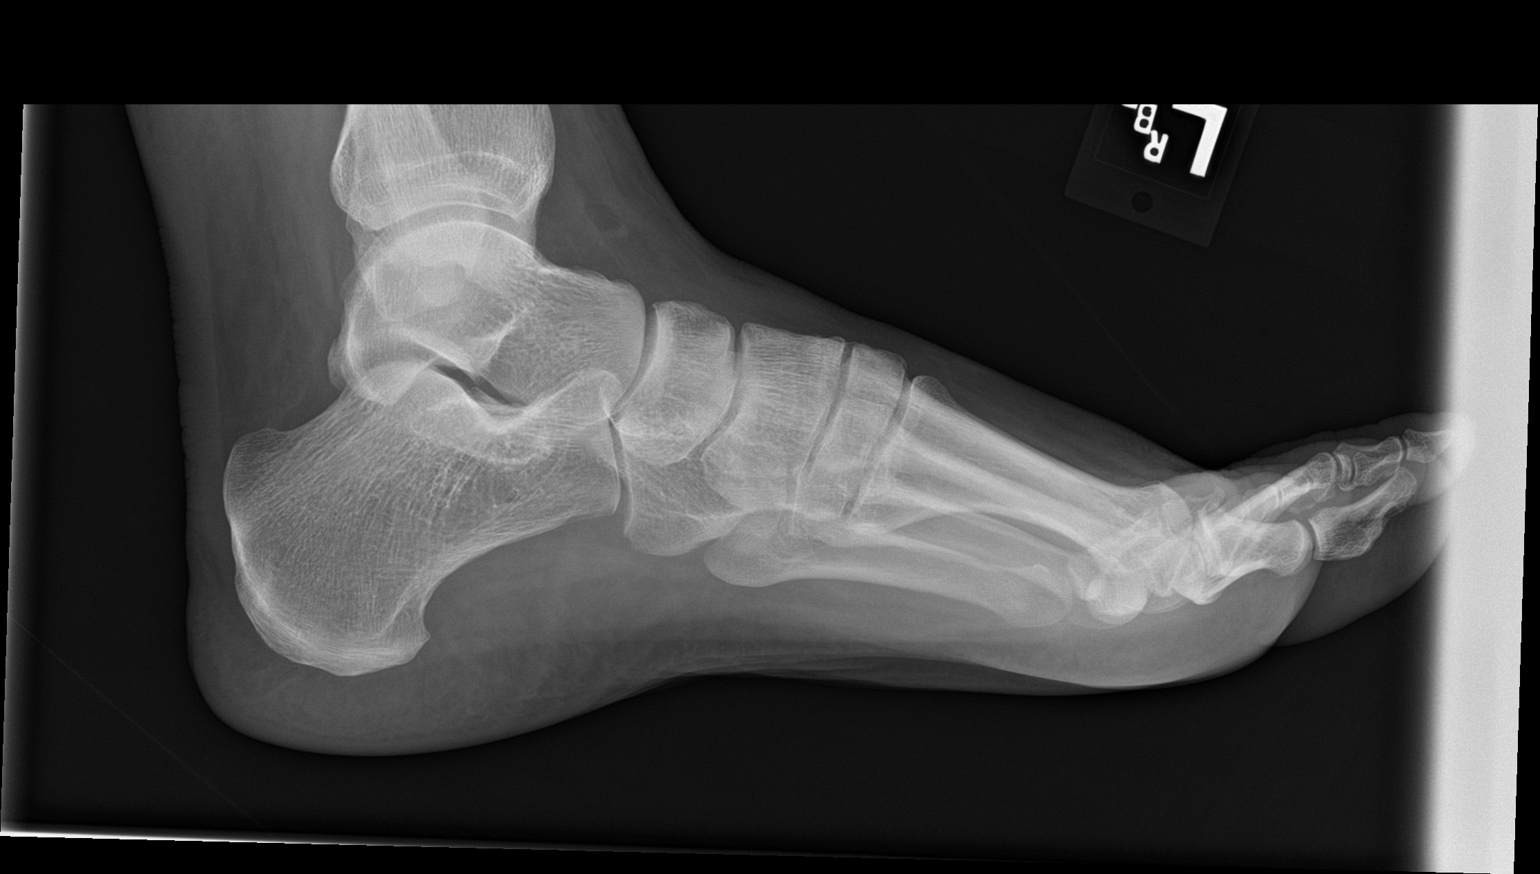

[3 of 3 positions shown; findings below may reference images not displayed]

FINDINGS: There is no evidence of fracture or dislocation. There is no
evidence of arthropathy or other focal bone abnormality. Soft
tissues are unremarkable.
IMPRESSION: No left foot fracture or dislocation.

## 2017-01-26 NOTE — Patient Instructions (Signed)
No fracture on your foot films today Try wearing lace-up supportive shoes or an OTC pos-op shoe for support for a couple of weeks If not getting better please do let us know

## 2017-01-26 NOTE — Progress Notes (Signed)
[place] [facility] [address], [place], [place] [address] [phone]  Date:  01/26/2017   Name:  Cheryl [name]   DOB:  1976-02-16   MRN:  [id number]  PCP:  Ann Held, DO    Chief Complaint: Foot Injury (c/o night stand fell on pt's left foot 3 weeks ago. )   History of Present Illness:  Cheryl [name] is a 41 y.o. very pleasant female patient who presents with the following:  Pt of Dr. Etter Sjogren here today for an acute visit She has complaint of left foot pain About 3 weeks ago a night stand fell on her foot while they were moving some furniture.  Her father dropped his end and it fell on her foot  It bruised and swelled over the dorsum of her foot and it was sore, but then seemed to be improving However it seems to have gotten worse again over the last week.  Admits that she has been wearing "slide" type shoes recently and that this may have increased her pain  She had been limping, did not use a boot or crutches, just "toughed it out," however her gait is not pretty much back to normal   LMP 7/20- no current chance of pregnancy, she is on OCP  Patient Active Problem List   Diagnosis Date Noted  . Hyperlipidemia LDL goal <70 01/07/2017  . Breast mass 11/20/2013  . OSA (obstructive sleep apnea) 10/17/2013  . DM (diabetes mellitus) type II uncontrolled, periph vascular disorder ([place]) 01/30/2013  . RECTAL BLEEDING 09/10/2008  . KNEE PAIN, LEFT 04/09/2008  . BACK PAIN 04/09/2008  . Hypothyroidism 02/17/2007  . HIRSUTISM 02/01/2007  . ACNE NEC 02/01/2007  . Bipolar II disorder ([place]) 12/20/2006  . DERMATITIS, OTHER ATOPIC 12/20/2006    Past Medical History:  Diagnosis Date  . Anxiety   . Bipolar 1 disorder ([place])   . Bipolar 2 disorder ([place])   . Depression   . Diabetes mellitus without complication ([place])   . Kidney stone   . Migraines    with aura  . OSA (obstructive sleep apnea) 10/17/2013   . Thyroid disease     Past Surgical History:  Procedure Laterality Date  . INTRAUTERINE DEVICE (IUD) INSERTION  5/13   inserted  . NASAL SEPTUM SURGERY    . OTHER SURGICAL HISTORY     endometrial polyps (2)  . WISDOM TOOTH EXTRACTION      Social History  Substance Use Topics  . Smoking status: Current Every Day Smoker    Packs/day: 1.00    Years: 20.00    Types: Cigarettes  . Smokeless tobacco: Never Used  . Alcohol use No    Family History  Problem Relation Age of Onset  . Cancer Mother 41       ovarian  . Hypothyroidism Mother   . Hypertension Father   . Diabetes Father   . Hyperlipidemia Father   . Heart disease Father        cabg--stint  . Migraines Father   . Hypothyroidism Sister   . Depression Sister   . Cancer Maternal Aunt        breast   . Cancer Maternal Uncle        throat  . COPD Maternal Grandmother   . Cancer Maternal Grandmother        lung  . Hypertension Maternal Grandmother   . Depression Maternal Grandmother   .  Stroke Maternal Grandmother   . Cancer Maternal Grandfather        prostate, lung  . Diabetes Paternal Grandmother   . Stroke Paternal Grandmother   . Alcohol abuse Paternal Grandfather   . Aneurysm Paternal Grandfather   . Depression Cousin        bipolar    Allergies  Allergen Reactions  . Food Itching    Bison-throat tingling    Medication list has been reviewed and updated.  Current Outpatient Prescriptions on File Prior to Visit  Medication Sig Dispense Refill  . clonazePAM (KLONOPIN) 0.5 MG tablet   0  . ibuprofen (ADVIL,MOTRIN) 200 MG tablet Take 400 mg by mouth every 6 (six) hours as needed.    . lamoTRIgine (LAMICTAL) 150 MG tablet Take 1 tablet (150 mg total) by mouth 2 (two) times daily. Take 1 tablet in the AM, 1 tablet in the PM 60 tablet 2  . levothyroxine (SYNTHROID, LEVOTHROID) 150 MCG tablet Take 1 tablet (150 mcg total) by mouth daily. Repeat labs are due now 90 tablet 3  . naproxen sodium (ANAPROX)  220 MG tablet Take 220 mg by mouth 2 (two) times daily as needed (pain).    . norethindrone (NORLYDA) 0.35 MG tablet Take 1 tablet by mouth daily.    Glory Rosebush VERIO test strip     . sertraline (ZOLOFT) 100 MG tablet Take 1 tablet (100 mg total) by mouth daily. 30 tablet 2  . simvastatin (ZOCOR) 20 MG tablet Take 1 tablet (20 mg total) by mouth at bedtime. 30 tablet 2   No current facility-administered medications on file prior to visit.     Review of Systems:  As per HPI- otherwise negative.   Physical Examination: Vitals:   01/26/17 1024  BP: 106/76  Pulse: 79  Temp: 98.9 F (37.2 C)   Vitals:   01/26/17 1024  Weight: 196 lb 12.8 oz (89.3 kg)   Body mass index is 31.76 kg/m. Ideal Body Weight:     GEN: WDWN, NAD, Non-toxic, Alert & Oriented x 3 HEENT: Atraumatic, Normocephalic.  Ears and Nose: No external deformity. EXTR: No clubbing/cyanosis/edema NEURO: Normal gait.  PSYCH: Normally interactive. Conversant. Not depressed or anxious appearing.  Calm demeanor.  Left foot: she is slightly tender to pressure over the mid to distal 4th MT; no bruise or swelling at this time  Dg Foot Complete Left  Result Date: 01/26/2017 CLINICAL DATA:  Left foot pain after crush injury EXAM: LEFT FOOT - COMPLETE 3+ VIEW COMPARISON:  None. FINDINGS: There is no evidence of fracture or dislocation. There is no evidence of arthropathy or other focal bone abnormality. Soft tissues are unremarkable. IMPRESSION: No left foot fracture or dislocation. Electronically Signed   By: Ilona Sorrel M.D.   On: 01/26/2017 11:04    Assessment and Plan: Left foot pain - Plan: DG Foot Complete Left  Contusion of left foot, initial encounter - Plan: DG Foot Complete Left no fracture on plain films of left foot today Discussed with pt and went over her films She will try wither supportive tennis shoes or an OTC post-op shoe for support, and will alert me if not improving in a week or  two  Signed Lamar Blinks, MD

## 2017-01-30 ENCOUNTER — Other Ambulatory Visit: Payer: Self-pay | Admitting: Family Medicine

## 2017-01-30 DIAGNOSIS — E1151 Type 2 diabetes mellitus with diabetic peripheral angiopathy without gangrene: Secondary | ICD-10-CM

## 2017-02-03 ENCOUNTER — Encounter (HOSPITAL_COMMUNITY): Payer: Self-pay | Admitting: Psychiatry

## 2017-02-03 ENCOUNTER — Ambulatory Visit (INDEPENDENT_AMBULATORY_CARE_PROVIDER_SITE_OTHER): Payer: 59 | Admitting: Psychiatry

## 2017-02-03 DIAGNOSIS — Z811 Family history of alcohol abuse and dependence: Secondary | ICD-10-CM

## 2017-02-03 DIAGNOSIS — F17210 Nicotine dependence, cigarettes, uncomplicated: Secondary | ICD-10-CM | POA: Diagnosis not present

## 2017-02-03 DIAGNOSIS — Z818 Family history of other mental and behavioral disorders: Secondary | ICD-10-CM

## 2017-02-03 DIAGNOSIS — F419 Anxiety disorder, unspecified: Secondary | ICD-10-CM | POA: Diagnosis not present

## 2017-02-03 DIAGNOSIS — F3181 Bipolar II disorder: Secondary | ICD-10-CM

## 2017-02-03 MED ORDER — LAMOTRIGINE 150 MG PO TABS: 150.0000 mg | ORAL_TABLET | Freq: Two times a day (BID) | ORAL | 2 refills | Status: DC

## 2017-02-03 MED ORDER — SERTRALINE HCL 100 MG PO TABS: 100.0000 mg | ORAL_TABLET | Freq: Every day | ORAL | 2 refills | Status: DC

## 2017-02-03 NOTE — Progress Notes (Signed)
BH MD/PA/NP OP Progress Note  02/03/2017 4:21 PM Cheryl [name]  MRN:  [id number]  Chief Complaint:  Subjective:  I'm getting married on September 29.  I'm happy.  Medicine is working very well.  HPI: Cheryl [name] came for her follow-up appointment.  She is taking Lamictal and Zoloft and doing very well on her medication.  She rarely takes Klonopin.  She sleeping good.  She is excited about upcoming wedding on September 29.  She also in a process of buying a house and hoping to move him very soon.  She denies any major panic attack and denies any irritability, anger, mania or any psychosis.  Her relationship with the boyfriend is going very well.  She sleeping good.  She still thinks about her cat who died within 3 months ago.  She has not decided to get another pet yet.  She endorsed her job is very boring because there is not much work.  She works in a [employer].  After the wedding she is thinking to switch her job.  Patient denies drinking alcohol or using any illegal substances.  Patient has no rash, itching, tremors or shakes.  Recently she had physical and blood work at her primary care physician's office.  She is happy that her hemoglobin A1c drop to 5.8.  Her energy level is good.  Her appetite is okay and her vital signs are stable.  Visit Diagnosis:    ICD-10-CM   1. Bipolar 2 disorder, major depressive episode (HCC) F31.81 sertraline (ZOLOFT) 100 MG tablet    lamoTRIgine (LAMICTAL) 150 MG tablet    Past Psychiatric History: Reviewed. 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 She took Prozac and Effexor in the past. She did not like Prozac and she had withdrawals from Effexor. She was diagnosed bipolar disorder few years ago when she remember having mania , mood swing and anger issues. Since then she is taking Lamictal . Patient denies any history of psychiatric inpatient treatment, suicidal attempt but admitted history of  fleeting and suicidal thoughts . Patient denies any self abusive behavior. She was seeing Jobie Quaker at [facility] but not happy with the services.   Past Medical History:  Past Medical History:  Diagnosis Date  . Anxiety   . Bipolar 1 disorder ([place])   . Bipolar 2 disorder ([place])   . Depression   . Diabetes mellitus without complication ([place])   . Kidney stone   . Migraines    with aura  . OSA (obstructive sleep apnea) 10/17/2013  . Thyroid disease     Past Surgical History:  Procedure Laterality Date  . INTRAUTERINE DEVICE (IUD) INSERTION  5/13   inserted  . NASAL SEPTUM SURGERY    . OTHER SURGICAL HISTORY     endometrial polyps (2)  . WISDOM TOOTH EXTRACTION      Family Psychiatric History: Reviewed.  Family History:  Family History  Problem Relation Age of Onset  . Cancer Mother 71       ovarian  . Hypothyroidism Mother   . Hypertension Father   . Diabetes Father   . Hyperlipidemia Father   . Heart disease Father        cabg--stint  . Migraines Father   . Hypothyroidism Sister   . Depression Sister   . Cancer Maternal Aunt        breast   . Cancer Maternal Uncle        throat  . COPD Maternal  Grandmother   . Cancer Maternal Grandmother        lung  . Hypertension Maternal Grandmother   . Depression Maternal Grandmother   . Stroke Maternal Grandmother   . Cancer Maternal Grandfather        prostate, lung  . Diabetes Paternal Grandmother   . Stroke Paternal Grandmother   . Alcohol abuse Paternal Grandfather   . Aneurysm Paternal Grandfather   . Depression Cousin        bipolar    Social History:  Social History   Social History  . Marital status: Single    Spouse name: N/A  . Number of children: N/A  . Years of education: N/A   Occupational History  . [employer]   Social History Main Topics  . Smoking status: Current Every Day Smoker    Packs/day: 1.00    Years: 20.00    Types: Cigarettes  .  Smokeless tobacco: Never Used  . Alcohol use No  . Drug use: No  . Sexual activity: Yes    Partners: Male    Birth control/ protection: IUD   Other Topics Concern  . Not on file   Social History Narrative   Exercise-- no    Allergies:  Allergies  Allergen Reactions  . Food Itching    Bison-throat tingling    Metabolic Disorder Labs: Lab Results  Component Value Date   HGBA1C 5.8 01/07/2017   No results found for: PROLACTIN Lab Results  Component Value Date   CHOL 184 01/07/2017   TRIG 155.0 (H) 01/07/2017   HDL 38.50 (L) 01/07/2017   CHOLHDL 5 01/07/2017   VLDL 31.0 01/07/2017   LDLCALC 115 (H) 01/07/2017   LDLCALC 52 04/22/2016     Current Medications: Current Outpatient Prescriptions  Medication Sig Dispense Refill  . clonazePAM (KLONOPIN) 0.5 MG tablet   0  . ibuprofen (ADVIL,MOTRIN) 200 MG tablet Take 400 mg by mouth every 6 (six) hours as needed.    . lamoTRIgine (LAMICTAL) 150 MG tablet Take 1 tablet (150 mg total) by mouth 2 (two) times daily. Take 1 tablet in the AM, 1 tablet in the PM 60 tablet 2  . levothyroxine (SYNTHROID, LEVOTHROID) 150 MCG tablet Take 1 tablet (150 mcg total) by mouth daily. Repeat labs are due now 90 tablet 3  . naproxen sodium (ANAPROX) 220 MG tablet Take 220 mg by mouth 2 (two) times daily as needed (pain).    . norethindrone (NORLYDA) 0.35 MG tablet Take 1 tablet by mouth daily.    Glory Rosebush VERIO test strip     . ONETOUCH VERIO test strip USE AS DIRECTED TWICE DAILY. 100 each 0  . sertraline (ZOLOFT) 100 MG tablet Take 1 tablet (100 mg total) by mouth daily. 30 tablet 2  . simvastatin (ZOCOR) 20 MG tablet Take 1 tablet (20 mg total) by mouth at bedtime. 30 tablet 2   No current facility-administered medications for this visit.     Neurologic: Headache: No Seizure: No Paresthesias: No  Musculoskeletal: Strength & Muscle Tone: within normal limits Gait & Station: normal Patient leans: N/A  Psychiatric Specialty  Exam: Review of Systems  Constitutional: Negative.   Respiratory: Negative.   Cardiovascular: Negative.   Genitourinary: Negative.   Musculoskeletal: Negative.   Skin: Negative.  Negative for itching and rash.  Neurological: Negative.     Blood pressure 128/78, pulse 74, height 5\' 6"  (1.676 m), weight 198 lb 9.6 oz (90.1 kg), last menstrual period  01/07/2017.There is no height or weight on file to calculate BMI.  General Appearance: Casual  Eye Contact:  Good  Speech:  Clear and Coherent  Volume:  Normal  Mood:  Euthymic  Affect:  Appropriate  Thought Process:  Goal Directed  Orientation:  Full (Time, Place, and Person)  Thought Content: Logical   Suicidal Thoughts:  No  Homicidal Thoughts:  No  Memory:  Immediate;   Good Recent;   Good Remote;   Good  Judgement:  Good  Insight:  Good  Psychomotor Activity:  Normal  Concentration:  Concentration: Good and Attention Span: Good  Recall:  Good  Fund of Knowledge: Good  Language: Good  Akathisia:  No  Handed:  Right  AIMS (if indicated):  0  Assets:  Communication Skills Desire for Improvement Financial Resources/Insurance Housing Physical Health Resilience Social Support Talents/Skills  ADL's:  Intact  Cognition: WNL  Sleep:  ok    Assessment: Bipolar 2 disorder.  Anxiety disorder NOS.  Plan: Patient is a stable on her current psychiatric medication.  She is compliant with Zoloft and Lamictal.  She has no rash, itching, tremors or shakes.  She rarely takes Klonopin.  Recommended to continue her medication.  Continue Lamictal 150 mg daily and Zoloft 100 mg daily.  Patient will call if needed to get a refill on Klonopin.  Recommended to call us back if she has any question or any concern.  Follow-up in 3 months.  Allayna Erlich T., MD 02/03/2017, 4:21 PM

## 2017-02-28 ENCOUNTER — Other Ambulatory Visit: Payer: Self-pay | Admitting: Family Medicine

## 2017-02-28 DIAGNOSIS — E1151 Type 2 diabetes mellitus with diabetic peripheral angiopathy without gangrene: Secondary | ICD-10-CM

## 2017-03-15 LAB — HM DIABETES EYE EXAM

## 2017-03-21 ENCOUNTER — Other Ambulatory Visit: Payer: Self-pay | Admitting: Family Medicine

## 2017-03-21 DIAGNOSIS — E1151 Type 2 diabetes mellitus with diabetic peripheral angiopathy without gangrene: Secondary | ICD-10-CM

## 2017-04-11 ENCOUNTER — Ambulatory Visit (INDEPENDENT_AMBULATORY_CARE_PROVIDER_SITE_OTHER): Payer: 59 | Admitting: Family Medicine

## 2017-04-11 ENCOUNTER — Encounter: Payer: Self-pay | Admitting: Family Medicine

## 2017-04-11 VITALS — BP 108/68 | HR 78 | Temp 98.2°F | Ht 66.0 in | Wt 205.0 lb

## 2017-04-11 DIAGNOSIS — S61258A Open bite of other finger without damage to nail, initial encounter: Secondary | ICD-10-CM | POA: Diagnosis not present

## 2017-04-11 DIAGNOSIS — W5501XA Bitten by cat, initial encounter: Secondary | ICD-10-CM

## 2017-04-11 DIAGNOSIS — E1165 Type 2 diabetes mellitus with hyperglycemia: Secondary | ICD-10-CM | POA: Diagnosis not present

## 2017-04-11 MED ORDER — ONETOUCH DELICA LANCETS FINE MISC: 1 refills | Status: AC

## 2017-04-11 MED ORDER — AMOXICILLIN-POT CLAVULANATE 875-125 MG PO TABS: 1.0000 | ORAL_TABLET | Freq: Two times a day (BID) | ORAL | 0 refills | Status: DC

## 2017-04-11 MED ORDER — CEFTRIAXONE SODIUM 1 G IJ SOLR: 1.0000 g | Freq: Once | INTRAMUSCULAR | Status: DC

## 2017-04-11 NOTE — Patient Instructions (Signed)
Animal Bite Animal bite wounds can get infected. It is important to get proper medical treatment. Ask your doctor if you need rabies treatment. Follow these instructions at home: Wound care  Follow instructions from your doctor about how to take care of your wound. Make sure you: ? Wash your hands with soap and water before you change your bandage (dressing). If you cannot use soap and water, use hand sanitizer. ? Change your bandage as told by your doctor. ? Leave stitches (sutures), skin glue, or skin tape (adhesive) strips in place. They may need to stay in place for 2 weeks or longer. If tape strips get loose and curl up, you may trim the loose edges. Do not remove tape strips completely unless your doctor says it is okay.  Check your wound every day for signs of infection. Watch for: ? Redness, swelling, or pain that gets worse. ? Fluid, blood, or pus. General instructions  Take or apply over-the-counter and prescription medicines only as told by your doctor.  If you were prescribed an antibiotic, take or apply it as told by your doctor. Do not stop using the antibiotic even if your condition improves.  Keep the injured area raised (elevated) above the level of your heart while you are sitting or lying down.  If directed, apply ice to the injured area. ? Put ice in a plastic bag. ? Place a towel between your skin and the bag. ? Leave the ice on for 20 minutes, 2-3 times per day.  Keep all follow-up visits as told by your doctor. This is important. Contact a doctor if:  You have redness, swelling, or pain that gets worse.  You have a general feeling of sickness (malaise).  You feel sick to your stomach (nauseous).  You throw up (vomit).  You have pain that does not get better. Get help right away if:  You have a red streak going away from your wound.  You have fluid, blood, or pus coming from your wound.  You have a fever or chills.  You have trouble moving your  injured area.  You have numbness or tingling anywhere on your body. This information is not intended to replace advice given to you by your health care provider. Make sure you discuss any questions you have with your health care provider. Document Released: 06/07/2005 Document Revised: 11/13/2015 Document Reviewed: 10/23/2014 Elsevier Interactive Patient Education  2018 Elsevier Inc.  

## 2017-04-11 NOTE — Progress Notes (Signed)
Patient ID: AZRIEL DANCY, female    DOB: 1975-06-25  Age: 41 y.o. MRN: [id number]    Subjective:  Subjective  HPI DALARY HOLLAR presents for cat bite L index finger 2 days ago.  Index finger red and swollen on L hand.  Very painful to touch.  No fevers etc.   Review of Systems  Constitutional: Negative for appetite change, diaphoresis, fatigue and unexpected weight change.  Eyes: Negative for pain, redness and visual disturbance.  Respiratory: Negative for cough, chest tightness, shortness of breath and wheezing.   Cardiovascular: Negative for chest pain, palpitations and leg swelling.  Endocrine: Negative for cold intolerance, heat intolerance, polydipsia, polyphagia and polyuria.  Genitourinary: Negative for difficulty urinating, dysuria and frequency.  Musculoskeletal: Positive for joint swelling.  Skin: Positive for color change and wound.  Neurological: Negative for dizziness, light-headedness, numbness and headaches.    History Past Medical History:  Diagnosis Date  . Anxiety   . Bipolar 1 disorder ([place])   . Bipolar 2 disorder ([place])   . Depression   . Diabetes mellitus without complication ([place])   . Kidney stone   . Migraines    with aura  . OSA (obstructive sleep apnea) 10/17/2013  . Thyroid disease     She has a past surgical history that includes Wisdom tooth extraction; Nasal septum surgery; Intrauterine device (iud) insertion (5/13); and Other surgical history.   Her family history includes Alcohol abuse in her paternal grandfather; Aneurysm in her paternal grandfather; COPD in her maternal grandmother; Cancer in her maternal aunt, maternal grandfather, maternal grandmother, and maternal uncle; Cancer (age of onset: 61) in her mother; Depression in her cousin, maternal grandmother, and sister; Diabetes in her father and paternal grandmother; Heart disease in her father; Hyperlipidemia in her father; Hypertension in her father and maternal grandmother;  Hypothyroidism in her mother and sister; Migraines in her father; Stroke in her maternal grandmother and paternal grandmother.She reports that she has been smoking Cigarettes.  She has a 20.00 pack-year smoking history. She has never used smokeless tobacco. She reports that she does not drink alcohol or use drugs.  Current Outpatient Prescriptions on File Prior to Visit  Medication Sig Dispense Refill  . atorvastatin (LIPITOR) 20 MG tablet TAKE 1 TABLET(20 MG) BY MOUTH DAILY AT 6 PM. REPEAT LABS ARE DUE NOW 30 tablet 0  . clonazePAM (KLONOPIN) 0.5 MG tablet   0  . ibuprofen (ADVIL,MOTRIN) 200 MG tablet Take 400 mg by mouth every 6 (six) hours as needed.    . lamoTRIgine (LAMICTAL) 150 MG tablet Take 1 tablet (150 mg total) by mouth 2 (two) times daily. Take 1 tablet in the AM, 1 tablet in the PM 60 tablet 2  . levothyroxine (SYNTHROID, LEVOTHROID) 150 MCG tablet Take 1 tablet (150 mcg total) by mouth daily. Repeat labs are due now 90 tablet 3  . naproxen sodium (ANAPROX) 220 MG tablet Take 220 mg by mouth 2 (two) times daily as needed (pain).    . norethindrone (NORLYDA) 0.35 MG tablet Take 1 tablet by mouth daily.    Glory Rosebush VERIO test strip     . ONETOUCH VERIO test strip USE AS DIRECTED TWICE DAILY. 100 each 0  . sertraline (ZOLOFT) 100 MG tablet Take 1 tablet (100 mg total) by mouth daily. 30 tablet 2  . simvastatin (ZOCOR) 20 MG tablet Take 1 tablet (20 mg total) by mouth at bedtime. 30 tablet 2   No current facility-administered medications on file prior to  visit.      Objective:  Objective  Physical Exam  Constitutional: She is oriented to person, place, and time. She appears well-developed and well-nourished.  HENT:  Head: Normocephalic and atraumatic.  Eyes: Conjunctivae and EOM are normal.  Neck: Normal range of motion. Neck supple. No JVD present. Carotid bruit is not present. No thyromegaly present.  Cardiovascular: Normal rate, regular rhythm and normal heart sounds.   No  murmur heard. Pulmonary/Chest: Effort normal and breath sounds normal. No respiratory distress. She has no wheezes. She has no rales. She exhibits no tenderness.  Musculoskeletal: She exhibits no edema.  Neurological: She is alert and oriented to person, place, and time.  Skin:     Psychiatric: She has a normal mood and affect.  Nursing note and vitals reviewed.    BP 108/68   Pulse 78   Temp 98.2 F (36.8 C) (Oral)   Ht 5\' 6"  (1.676 m)   Wt 205 lb (93 kg)   LMP 03/27/2017 (Exact Date)   SpO2 98%   BMI 33.09 kg/m  Wt Readings from Last 3 Encounters:  04/11/17 205 lb (93 kg)  01/26/17 196 lb 12.8 oz (89.3 kg)  01/07/17 194 lb 9.6 oz (88.3 kg)     Lab Results  Component Value Date   WBC 11.2 (H) 08/21/2016   HGB 14.3 08/21/2016   HCT 42.7 08/21/2016   PLT 227 08/21/2016   GLUCOSE 100 (H) 01/07/2017   CHOL 184 01/07/2017   TRIG 155.0 (H) 01/07/2017   HDL 38.50 (L) 01/07/2017   LDLDIRECT 136.0 11/19/2015   LDLCALC 115 (H) 01/07/2017   ALT 15 01/07/2017   AST 13 01/07/2017   NA 137 01/07/2017   K 4.2 01/07/2017   CL 103 01/07/2017   CREATININE 0.89 01/07/2017   BUN 16 01/07/2017   CO2 30 01/07/2017   TSH 3.54 01/07/2017   HGBA1C 5.8 01/07/2017   MICROALBUR 5.5 (H) 08/01/2015    Dg Foot Complete Left  Result Date: 01/26/2017 CLINICAL DATA:  Left foot pain after crush injury EXAM: LEFT FOOT - COMPLETE 3+ VIEW COMPARISON:  None. FINDINGS: There is no evidence of fracture or dislocation. There is no evidence of arthropathy or other focal bone abnormality. Soft tissues are unremarkable. IMPRESSION: No left foot fracture or dislocation. Electronically Signed   By: Ilona Sorrel M.D.   On: 01/26/2017 11:04     Assessment & Plan:  Plan  I have discontinued Ms. Stanfill's onetouch ultrasoft. I am also having her start on amoxicillin-clavulanate and [place]. Additionally, I am having her maintain her naproxen sodium, ibuprofen, clonazePAM,  norethindrone, ONETOUCH VERIO, levothyroxine, simvastatin, ONETOUCH VERIO, sertraline, lamoTRIgine, and atorvastatin. We will continue to administer cefTRIAXone.  Meds ordered this encounter  Medications  . amoxicillin-clavulanate (AUGMENTIN) 875-125 MG tablet    Sig: Take 1 tablet by mouth 2 (two) times daily.    Dispense:  20 tablet    Refill:  0  . ONETOUCH DELICA LANCETS FINE MISC    Sig: As directed qd    Dispense:  100 each    Refill:  1  . cefTRIAXone (ROCEPHIN) injection 1 g    Problem List Items Addressed This Visit    None    Visit Diagnoses    Cat bite of index finger, initial encounter    -  Primary   Relevant Medications   amoxicillin-clavulanate (AUGMENTIN) 875-125 MG tablet   cefTRIAXone (ROCEPHIN) injection 1 g   Uncontrolled type 2 diabetes mellitus with  hyperglycemia ([place])       Relevant Medications   ONETOUCH DELICA LANCETS FINE MISC      Follow-up: Return if symptoms worsen or fail to improve.  Ann Held, DO

## 2017-04-12 ENCOUNTER — Ambulatory Visit (INDEPENDENT_AMBULATORY_CARE_PROVIDER_SITE_OTHER): Payer: 59 | Admitting: Family Medicine

## 2017-04-12 ENCOUNTER — Telehealth: Payer: Self-pay | Admitting: Family Medicine

## 2017-04-12 ENCOUNTER — Encounter: Payer: Self-pay | Admitting: Family Medicine

## 2017-04-12 VITALS — BP 102/72 | HR 70 | Temp 98.5°F | Ht 66.0 in | Wt 205.0 lb

## 2017-04-12 DIAGNOSIS — W5501XD Bitten by cat, subsequent encounter: Secondary | ICD-10-CM

## 2017-04-12 DIAGNOSIS — L089 Local infection of the skin and subcutaneous tissue, unspecified: Secondary | ICD-10-CM

## 2017-04-12 DIAGNOSIS — S61452D Open bite of left hand, subsequent encounter: Secondary | ICD-10-CM

## 2017-04-12 NOTE — Telephone Encounter (Addendum)
Patient scheduled with PCP for today at 4:30pm

## 2017-04-12 NOTE — Telephone Encounter (Signed)
lvm advising patient of PCP instruction awaiting call back

## 2017-04-12 NOTE — Telephone Encounter (Signed)
Needs ov today --- let me take a quick look and we can give another shot abx started last night

## 2017-04-12 NOTE — Progress Notes (Signed)
Patient ID: Cheryl [name], female    DOB: [birth year]  Age: 42 y.o. MRN: [id number]    Subjective:  Subjective  HPI Cheryl [name] presents for f/u cat bite---  Redness has extended.  No fevers,  No other new complaints.    Review of Systems  Constitutional: Negative for appetite change, diaphoresis, fatigue and unexpected weight change.  Eyes: Negative for pain, redness and visual disturbance.  Respiratory: Negative for cough, chest tightness, shortness of breath and wheezing.   Cardiovascular: Negative for chest pain, palpitations and leg swelling.  Endocrine: Negative for cold intolerance, heat intolerance, polydipsia, polyphagia and polyuria.  Genitourinary: Negative for difficulty urinating, dysuria and frequency.  Skin: Positive for color change.  Neurological: Negative for dizziness, light-headedness, numbness and headaches.    History Past Medical History:  Diagnosis Date  . Anxiety   . Bipolar 1 disorder ([place])   . Bipolar 2 disorder ([place])   . Depression   . Diabetes mellitus without complication ([place])   . Kidney stone   . Migraines    with aura  . OSA (obstructive sleep apnea) 10/17/2013  . Thyroid disease     She has a past surgical history that includes Wisdom tooth extraction; Nasal septum surgery; Intrauterine device (iud) insertion (5/13); and Other surgical history.   Her family history includes Alcohol abuse in her paternal grandfather; Aneurysm in her paternal grandfather; COPD in her maternal grandmother; Cancer in her maternal aunt, maternal grandfather, maternal grandmother, and maternal uncle; Cancer (age of onset: 43) in her mother; Depression in her cousin, maternal grandmother, and sister; Diabetes in her father and paternal grandmother; Heart disease in her father; Hyperlipidemia in her father; Hypertension in her father and maternal grandmother; Hypothyroidism in her mother and sister; Migraines in her father; Stroke in her maternal grandmother  and paternal grandmother.She reports that she has been smoking Cigarettes.  She has a 20.00 pack-year smoking history. She has never used smokeless tobacco. She reports that she does not drink alcohol or use drugs.  Current Outpatient Prescriptions on File Prior to Visit  Medication Sig Dispense Refill  . amoxicillin-clavulanate (AUGMENTIN) 875-125 MG tablet Take 1 tablet by mouth 2 (two) times daily. 20 tablet 0  . atorvastatin (LIPITOR) 20 MG tablet TAKE 1 TABLET(20 MG) BY MOUTH DAILY AT 6 PM. REPEAT LABS ARE DUE NOW 30 tablet 0  . clonazePAM (KLONOPIN) 0.5 MG tablet   0  . ibuprofen (ADVIL,MOTRIN) 200 MG tablet Take 400 mg by mouth every 6 (six) hours as needed.    . lamoTRIgine (LAMICTAL) 150 MG tablet Take 1 tablet (150 mg total) by mouth 2 (two) times daily. Take 1 tablet in the AM, 1 tablet in the PM 60 tablet 2  . levothyroxine (SYNTHROID, LEVOTHROID) 150 MCG tablet Take 1 tablet (150 mcg total) by mouth daily. Repeat labs are due now 90 tablet 3  . naproxen sodium (ANAPROX) 220 MG tablet Take 220 mg by mouth 2 (two) times daily as needed (pain).    . norethindrone (NORLYDA) 0.35 MG tablet Take 1 tablet by mouth daily.    Cheryl [name] DELICA LANCETS FINE MISC As directed qd 100 each 1  . ONETOUCH VERIO test strip     . ONETOUCH VERIO test strip USE AS DIRECTED TWICE DAILY. 100 each 0  . sertraline (ZOLOFT) 100 MG tablet Take 1 tablet (100 mg total) by mouth daily. 30 tablet 2  . simvastatin (ZOCOR) 20 MG tablet Take 1 tablet (20 mg total) by mouth at  bedtime. 30 tablet 2   Current Facility-Administered Medications on File Prior to Visit  Medication Dose Route Frequency Provider Last Rate Last Dose  . cefTRIAXone (ROCEPHIN) injection 1 g  1 g Intramuscular Once Carollee Herter, Kendrick Fries R, DO         Objective:  Objective  Physical Exam  Musculoskeletal: She exhibits tenderness.  Skin: There is erythema.  Nursing note and vitals reviewed.  BP 102/72   Pulse 70   Temp 98.5 F (36.9 C)  (Oral)   Ht 5\' 6"  (1.676 m)   Wt 205 lb (93 kg)   LMP 03/27/2017 (Exact Date)   SpO2 98%   BMI 33.09 kg/m  Wt Readings from Last 3 Encounters:  04/12/17 205 lb (93 kg)  04/11/17 205 lb (93 kg)  01/26/17 196 lb 12.8 oz (89.3 kg)     Lab Results  Component Value Date   WBC 11.2 (H) 08/21/2016   HGB 14.3 08/21/2016   HCT 42.7 08/21/2016   PLT 227 08/21/2016   GLUCOSE 100 (H) 01/07/2017   CHOL 184 01/07/2017   TRIG 155.0 (H) 01/07/2017   HDL 38.50 (L) 01/07/2017   LDLDIRECT 136.0 11/19/2015   LDLCALC 115 (H) 01/07/2017   ALT 15 01/07/2017   AST 13 01/07/2017   NA 137 01/07/2017   K 4.2 01/07/2017   CL 103 01/07/2017   CREATININE 0.89 01/07/2017   BUN 16 01/07/2017   CO2 30 01/07/2017   TSH 3.54 01/07/2017   HGBA1C 5.8 01/07/2017   MICROALBUR 5.5 (H) 08/01/2015    Dg Foot Complete Left  Result Date: 01/26/2017 CLINICAL DATA:  Left foot pain after crush injury EXAM: LEFT FOOT - COMPLETE 3+ VIEW COMPARISON:  None. FINDINGS: There is no evidence of fracture or dislocation. There is no evidence of arthropathy or other focal bone abnormality. Soft tissues are unremarkable. IMPRESSION: No left foot fracture or dislocation. Electronically Signed   By: Ilona Sorrel M.D.   On: 01/26/2017 11:04     Assessment & Plan:  Plan  I am having Cheryl [name] maintain her naproxen sodium, ibuprofen, clonazePAM, norethindrone, ONETOUCH VERIO, levothyroxine, simvastatin, ONETOUCH VERIO, sertraline, lamoTRIgine, atorvastatin, amoxicillin-clavulanate, and ONETOUCH DELICA LANCETS FINE. We will continue to administer cefTRIAXone.  No orders of the defined types were placed in this encounter.   Problem List Items Addressed This Visit      Unprioritized   Cat bite of left hand including fingers with infection - Primary    Rocephin 2 g given con't abx Call if redness worsens or inc swelling , pain         Follow-up: Return if symptoms worsen or fail to improve.  Ann Held,  DO

## 2017-04-12 NOTE — Telephone Encounter (Signed)
°  Relation to QB:VQXI Call back number: [phone]    Reason for call:  Patient was seen for a cat bite 04/11/17 redness has spread 1/2 inch and swelling has no improved, symtomps have stayed the same, please advise

## 2017-04-12 NOTE — Patient Instructions (Signed)
Animal Bite Animal bites can range from mild to serious. An animal bite can result in a scratch on the skin, a deep open cut, a puncture of the skin, a crush injury, or tearing away of the skin or a body part. A small bite from a house pet will usually not cause serious problems. However, some animal bites can become infected or injure a bone or other tissue. Bites from certain animals can be more dangerous because of the risk of spreading rabies, which is a serious viral infection. This risk is higher with bites from stray animals or wild animals, such as raccoons, foxes, skunks, and bats. Dogs are responsible for most animal bites. Children are bitten more often than adults. What are the signs or symptoms? Common symptoms of an animal bite include:  Pain.  Bleeding.  Swelling.  Bruising.  How is this diagnosed? This condition may be diagnosed based on a physical exam and medical history. Your health care provider will examine the wound and ask for details about the animal and how the bite happened. You may also have tests, such as:  Blood tests to check for infection or to determine if surgery is needed.  X-rays to check for damage to bones or joints.  Culture test. This uses a sample of fluid from the wound to check for infection.  How is this treated? Treatment varies depending on the location and type of animal bite and your medical history. Treatment may include:  Wound care. This often includes cleaning the wound, flushing the wound with saline solution, and applying a bandage (dressing). Sometimes, the wound is left open to heal because of the high risk of infection. However, in some cases, the wound may be closed with stitches (sutures), staples, skin glue, or adhesive strips.  Antibiotic medicine.  Tetanus shot.  Rabies treatment if the animal could have rabies.  In some cases, bites that have become infected may require IV antibiotics and surgical treatment in the  hospital. Follow these instructions at home: Wound care  Follow instructions from your health care provider about how to take care of your wound. Make sure you: ? Wash your hands with soap and water before you change your dressing. If soap and water are not available, use hand sanitizer. ? Change your dressing as told by your health care provider. ? Leave sutures, skin glue, or adhesive strips in place. These skin closures may need to be in place for 2 weeks or longer. If adhesive strip edges start to loosen and curl up, you may trim the loose edges. Do not remove adhesive strips completely unless your health care provider tells you to do that.  Check your wound every day for signs of infection. Watch for: ? Increasing redness, swelling, or pain. ? Fluid, blood, or pus. General instructions  Take or apply over-the-counter and prescription medicines only as told by your health care provider.  If you were prescribed an antibiotic, take or apply it as told by your health care provider. Do not stop using the antibiotic even if your condition improves.  Keep the injured area raised (elevated) above the level of your heart while you are sitting or lying down, if this is possible.  If directed, apply ice to the injured area. ? Put ice in a plastic bag. ? Place a towel between your skin and the bag. ? Leave the ice on for 20 minutes, 2-3 times per day.  Keep all follow-up visits as told by your health care   provider. This is important. Contact a health care provider if:  You have increasing redness, swelling, or pain at the site of your wound.  You have a general feeling of sickness (malaise).  You feel nauseous or you vomit.  You have pain that does not get better. Get help right away if:  You have a red streak extending away from your wound.  You have fluid, blood, or pus coming from your wound.  You have a fever or chills.  You have trouble moving your injured area.  You have  numbness or tingling extending beyond the wound. This information is not intended to replace advice given to you by your health care provider. Make sure you discuss any questions you have with your health care provider. Document Released: 02/23/2011 Document Revised: 10/15/2015 Document Reviewed: 10/23/2014 Elsevier Interactive Patient Education  2018 [employer] Inc.  

## 2017-04-13 DIAGNOSIS — W5501XA Bitten by cat, initial encounter: Secondary | ICD-10-CM

## 2017-04-13 DIAGNOSIS — S61452A Open bite of left hand, initial encounter: Secondary | ICD-10-CM

## 2017-04-13 DIAGNOSIS — L089 Local infection of the skin and subcutaneous tissue, unspecified: Secondary | ICD-10-CM | POA: Insufficient documentation

## 2017-04-13 NOTE — Assessment & Plan Note (Signed)
Rocephin 2 g given con't abx Call if redness worsens or inc swelling , pain

## 2017-04-14 ENCOUNTER — Encounter (HOSPITAL_COMMUNITY): Payer: Self-pay | Admitting: Emergency Medicine

## 2017-04-14 ENCOUNTER — Ambulatory Visit (HOSPITAL_COMMUNITY)
Admission: EM | Admit: 2017-04-14 | Discharge: 2017-04-14 | Disposition: A | Discharge status: Home / Self Care | Payer: 59 | Attending: Family Medicine | Admitting: Family Medicine

## 2017-04-14 ENCOUNTER — Telehealth: Payer: Self-pay | Admitting: Family Medicine

## 2017-04-14 DIAGNOSIS — M79602 Pain in left arm: Secondary | ICD-10-CM | POA: Diagnosis not present

## 2017-04-14 NOTE — Telephone Encounter (Signed)
[facility] - Client TELEPHONE ADVICE RECORD [facility] Patient Name: Cheryl [name] DOB: Jul 05, 1975 Initial Comment Caller was seen earlier in the week for an animal bite on left hand, given abx and abx are working on that. Now has a red, hot place on her left upper arm. Nurse Assessment Nurse: Martyn Ehrich, RN, Felicia Date/Time (Eastern Time): 04/14/2017 4:51:31 PM Confirm and document reason for call. If symptomatic, describe symptoms. ---Pt had an animal bite (cat) on L hand and she is on antx since Mon. for it and reddish area that is sore on upper L arm. No fever Does the patient have any new or worsening symptoms? ---Yes Will a triage be completed? ---Yes Related visit to physician within the last 2 weeks? ---Yes Does the PT have any chronic conditions? (i.e. diabetes, asthma, etc.) ---Yes List chronic conditions. ---DM Is the patient pregnant or possibly pregnant? (Ask all females between the ages of 40-55) ---No Is this a behavioral health or substance abuse call? ---No Guidelines Guideline Title Affirmed Question Affirmed Notes Animal Bite Looks infected (red area, red streak, or pus) Final Disposition User See Physician within 4 Hours (or PCP triage) Martyn Ehrich, RN, Felicia Comments hand looks a lot better - redness is decreased - bit is oozing cloudy drainage - yellowish the red area is L shaped and flat - no blister and no scabs and no pimples - nurse is concerned about the yellow cloudy pus still draining from hand but it is looking improved. But now it is Thurs and still having pus drainage pink area on upper arm is L shaped and main part is 1 1/2" x 3/4" and L part is 2" she will find an UC location Referrals GO TO FACILITY UNDECIDED GO TO FACILITY UNDECIDED Caller Disagree/Comply Comply Caller Understands Yes PreDisposition Did not know what to do Call Id: [id number]

## 2017-04-14 NOTE — ED Triage Notes (Signed)
Pt has cat bite to left index finger and now has pink sore area to upper arm; pt here for eval pt sts taking antibiotics

## 2017-04-15 ENCOUNTER — Telehealth: Payer: Self-pay | Admitting: Family Medicine

## 2017-04-15 NOTE — Telephone Encounter (Signed)
Pt states antibiotic Lowne gave her is causing severed Diarrhea. Please call in new antibiotic OR something to help stop diarrhea. Pt states immodium is not working.

## 2017-04-18 MED ORDER — CEFUROXIME AXETIL 500 MG PO TABS: 500.0000 mg | ORAL_TABLET | Freq: Two times a day (BID) | ORAL | 0 refills | Status: DC

## 2017-04-18 NOTE — Telephone Encounter (Signed)
D/c augmentin--- ceftin 500 mg bid x 10 days

## 2017-04-18 NOTE — Telephone Encounter (Signed)
Rx sent and detailed message was left on pt's voicemail and to call if any questions.

## 2017-04-19 NOTE — ED Provider Notes (Signed)
[place]   [id number] 04/14/17 Arrival Time: 6160  ASSESSMENT & PLAN:  1. Left arm pain    Being treated for cat bite/cellulitis. Responding. Question mild biceps tendinitis. Will continue ibuprofen and f/u as needed. Finish antibiotics. Do not think this is related to cat bite/infection. Reviewed expectations re: course of current medical issues. Questions answered. Outlined signs and symptoms indicating need for more acute intervention. Patient verbalized understanding. After Visit Summary given.   SUBJECTIVE:  Cheryl [name] is a 41 y.o. female who presents with complaint of tenderness in her L arm. Gradual onset over a few days. Being treated for a cat bite infection of her L hand. Reports she is responding to antibiotics. No fever. Arm tender over distal biceps. No injury. Certain movements with slight exacerbation of discomfort. No extremity sensation changes or weakness. Ibuprofen with some help.  ROS: As per HPI.   OBJECTIVE:  Vitals:   04/14/17 1802  BP: 124/68  Pulse: 87  Resp: 18  Temp: 99.2 F (37.3 C)  TempSrc: Oral  SpO2: 97%    General appearance: alert; no distress Eyes: PERRLA; EOMI; conjunctiva normal HENT: normocephalic; atraumatic; TMs normal; nasal mucosa normal; oral mucosa normal Neck: supple Lungs: clear to auscultation bilaterally Heart: regular rate and rhythm Extremities: no cyanosis or edema; symmetrical with no gross deformities; mild tenderness over distal biceps tendon; FROM of LUE Skin: warm and dry Neurologic: normal gait; normal symmetric reflexes Psychological: alert and cooperative; normal mood and affect   Allergies  Allergen Reactions  . Food Itching    Bison-throat tingling  . Augmentin [Amoxicillin-Pot Clavulanate] Diarrhea    Past Medical History:  Diagnosis Date  . Anxiety   . Bipolar 1 disorder ([place])   . Bipolar 2 disorder ([place])   . Depression   . Diabetes mellitus without complication ([place])     . Kidney stone   . Migraines    with aura  . OSA (obstructive sleep apnea) 10/17/2013  . Thyroid disease    Social History   Social History  . Marital status: Married    Spouse name: N/A  . Number of children: N/A  . Years of education: N/A   Occupational History  . [employer]   Social History Main Topics  . Smoking status: Current Every Day Smoker    Packs/day: 1.00    Years: 20.00    Types: Cigarettes  . Smokeless tobacco: Never Used  . Alcohol use No  . Drug use: No  . Sexual activity: Yes    Partners: Male    Birth control/ protection: IUD   Other Topics Concern  . Not on file   Social History Narrative   Exercise-- no   Family History  Problem Relation Age of Onset  . Cancer Mother 49       ovarian  . Hypothyroidism Mother   . Hypertension Father   . Diabetes Father   . Hyperlipidemia Father   . Heart disease Father        cabg--stint  . Migraines Father   . Hypothyroidism Sister   . Depression Sister   . Cancer Maternal Aunt        breast   . Cancer Maternal Uncle        throat  . COPD Maternal Grandmother   . Cancer Maternal Grandmother        lung  . Hypertension Maternal Grandmother   . Depression Maternal Grandmother   . Stroke Maternal Grandmother   .  Cancer Maternal Grandfather        prostate, lung  . Diabetes Paternal Grandmother   . Stroke Paternal Grandmother   . Alcohol abuse Paternal Grandfather   . Aneurysm Paternal Grandfather   . Depression Cousin        bipolar   Past Surgical History:  Procedure Laterality Date  . INTRAUTERINE DEVICE (IUD) INSERTION  5/13   inserted  . NASAL SEPTUM SURGERY    . OTHER SURGICAL HISTORY     endometrial polyps (2)  . WISDOM TOOTH EXTRACTION       Vanessa Kick, MD 04/19/17 [phone]

## 2017-05-02 ENCOUNTER — Other Ambulatory Visit: Payer: Self-pay | Admitting: Family Medicine

## 2017-05-05 ENCOUNTER — Encounter (HOSPITAL_COMMUNITY): Payer: Self-pay | Admitting: Psychiatry

## 2017-05-05 ENCOUNTER — Ambulatory Visit (HOSPITAL_COMMUNITY): Payer: 59 | Admitting: Psychiatry

## 2017-05-05 DIAGNOSIS — F419 Anxiety disorder, unspecified: Secondary | ICD-10-CM

## 2017-05-05 DIAGNOSIS — F3181 Bipolar II disorder: Secondary | ICD-10-CM

## 2017-05-05 DIAGNOSIS — F17210 Nicotine dependence, cigarettes, uncomplicated: Secondary | ICD-10-CM | POA: Diagnosis not present

## 2017-05-05 DIAGNOSIS — Z818 Family history of other mental and behavioral disorders: Secondary | ICD-10-CM

## 2017-05-05 DIAGNOSIS — Z79899 Other long term (current) drug therapy: Secondary | ICD-10-CM | POA: Diagnosis not present

## 2017-05-05 MED ORDER — LAMOTRIGINE 150 MG PO TABS: 150.0000 mg | ORAL_TABLET | Freq: Two times a day (BID) | ORAL | 2 refills | Status: DC

## 2017-05-05 MED ORDER — SERTRALINE HCL 100 MG PO TABS: 100.0000 mg | ORAL_TABLET | Freq: Every day | ORAL | 2 refills | Status: DC

## 2017-05-05 NOTE — Progress Notes (Signed)
[place] MD/PA/NP OP Progress Note  05/05/2017 4:13 PM Cheryl [name]  MRN:  [id number]  Chief Complaint: I got married on September 29.  Things are going very well.  HPI: Cheryl [name] came for her follow-up appointment.  She is taking Lamictal and Zoloft.  She got married on September 29.  She was very anxious and took Klonopin before the wedding things went very well.  She also bought a house and moved in and she is happy about her living situation.  She denies any irritability, anger, mania or any psychosis.  Recently she is seen in the emergency room when her cat bit her and she has redness.  Overall she described her mood is good.  Her job is going very well.  She denies any major panic attack but sometimes she gets nervous.  She like taking Zoloft is helping her anxiety and Lamictal which is helping her mood.  Patient denies drinking alcohol or using any illegal substances.  Her energy level is good.  She has no rash, itching, tremors or shakes.  Visit Diagnosis:    ICD-10-CM   1. Bipolar 2 disorder, major depressive episode (HCC) F31.81 sertraline (ZOLOFT) 100 MG tablet    lamoTRIgine (LAMICTAL) 150 MG tablet    Past Psychiatric History: Reviewed Patient reported having depression when she was studying at [employer]. She took Prozac and Effexor in the past. She did not like Prozac and she had withdrawals from Effexor. She was diagnosed bipolar disorder few years ago when she remember having mania , mood swing and anger issues. Since then she is taking Lamictal . Patient denies any history of psychiatric inpatient treatment, suicidal attempt but admitted history of fleeting and suicidal thoughts . Patient denies any self abusive behavior. She was seeing Jobie Quaker at [facility] but not happy with the services.   Past Medical History:  Past Medical History:  Diagnosis Date  . Anxiety   . Bipolar 1 disorder ([place])   . Bipolar 2 disorder ([place])   . Depression   .  Diabetes mellitus without complication ([place])   . Kidney stone   . Migraines    with aura  . OSA (obstructive sleep apnea) 10/17/2013  . Thyroid disease     Past Surgical History:  Procedure Laterality Date  . INTRAUTERINE DEVICE (IUD) INSERTION  5/13   inserted  . NASAL SEPTUM SURGERY    . OTHER SURGICAL HISTORY     endometrial polyps (2)  . WISDOM TOOTH EXTRACTION      Family Psychiatric History: Reviewed.  Family History:  Family History  Problem Relation Age of Onset  . Cancer Mother 75       ovarian  . Hypothyroidism Mother   . Hypertension Father   . Diabetes Father   . Hyperlipidemia Father   . Heart disease Father        cabg--stint  . Migraines Father   . Hypothyroidism Sister   . Depression Sister   . Cancer Maternal Aunt        breast   . Cancer Maternal Uncle        throat  . COPD Maternal Grandmother   . Cancer Maternal Grandmother        lung  . Hypertension Maternal Grandmother   . Depression Maternal Grandmother   . Stroke Maternal Grandmother   . Cancer Maternal Grandfather        prostate, lung  . Diabetes Paternal Grandmother   . Stroke Paternal Grandmother   .  Alcohol abuse Paternal Grandfather   . Aneurysm Paternal Grandfather   . Depression Cousin        bipolar    Social History:  Social History   Socioeconomic History  . Marital status: Married    Spouse name: Not on file  . Number of children: Not on file  . Years of education: Not on file  . Highest education level: Not on file  Social Needs  . Financial resource strain: Not on file  . Food insecurity - worry: Not on file  . Food insecurity - inability: Not on file  . Transportation needs - medical: Not on file  . Transportation needs - non-medical: Not on file  Occupational History  . Occupation: Designer, multimedia: [employer]  Tobacco Use  . Smoking status: Current Every Day Smoker    Packs/day: 1.00    Years: 20.00    Pack years:  20.00    Types: Cigarettes  . Smokeless tobacco: Never Used  Substance and Sexual Activity  . Alcohol use: No    Alcohol/week: 0.0 oz  . Drug use: No  . Sexual activity: Yes    Partners: Male    Birth control/protection: IUD  Other Topics Concern  . Not on file  Social History Narrative   Exercise-- no    Allergies:  Allergies  Allergen Reactions  . Food Itching    Bison-throat tingling  . Augmentin [Amoxicillin-Pot Clavulanate] Diarrhea    Metabolic Disorder Labs: Lab Results  Component Value Date   HGBA1C 5.8 01/07/2017   No results found for: PROLACTIN Lab Results  Component Value Date   CHOL 184 01/07/2017   TRIG 155.0 (H) 01/07/2017   HDL 38.50 (L) 01/07/2017   CHOLHDL 5 01/07/2017   VLDL 31.0 01/07/2017   LDLCALC 115 (H) 01/07/2017   LDLCALC 52 04/22/2016   Lab Results  Component Value Date   TSH 3.54 01/07/2017   TSH 11.31 (H) 04/22/2016    Therapeutic Level Labs: No results found for: LITHIUM No results found for: VALPROATE No components found for:  CBMZ  Current Medications: Current Outpatient Medications  Medication Sig Dispense Refill  . atorvastatin (LIPITOR) 20 MG tablet TAKE 1 TABLET(20 MG) BY MOUTH DAILY AT 6 PM. REPEAT LABS ARE DUE NOW 30 tablet 0  . cefUROXime (CEFTIN) 500 MG tablet Take 1 tablet (500 mg total) by mouth 2 (two) times daily with a meal. 20 tablet 0  . clonazePAM (KLONOPIN) 0.5 MG tablet   0  . ibuprofen (ADVIL,MOTRIN) 200 MG tablet Take 400 mg by mouth every 6 (six) hours as needed.    . lamoTRIgine (LAMICTAL) 150 MG tablet Take 1 tablet (150 mg total) by mouth 2 (two) times daily. Take 1 tablet in the AM, 1 tablet in the PM 60 tablet 2  . levothyroxine (SYNTHROID, LEVOTHROID) 150 MCG tablet Take 1 tablet (150 mcg total) by mouth daily. Repeat labs are due now 90 tablet 3  . naproxen sodium (ANAPROX) 220 MG tablet Take 220 mg by mouth 2 (two) times daily as needed (pain).    . norethindrone (NORLYDA) 0.35 MG tablet Take 1  tablet by mouth daily.    Glory Rosebush DELICA LANCETS FINE MISC As directed qd 100 each 1  . ONETOUCH VERIO test strip     . ONETOUCH VERIO test strip USE AS DIRECTED TWICE DAILY. 100 each 0  . sertraline (ZOLOFT) 100 MG tablet Take 1 tablet (100 mg total) by mouth daily.  30 tablet 2  . simvastatin (ZOCOR) 20 MG tablet Take 1 tablet (20 mg total) by mouth at bedtime. 30 tablet 2   Current Facility-Administered Medications  Medication Dose Route Frequency Provider Last Rate Last Dose  . cefTRIAXone (ROCEPHIN) injection 1 g  1 g Intramuscular Once Ann Held, DO         Musculoskeletal: Strength & Muscle Tone: within normal limits Gait & Station: normal Patient leans: N/A  Psychiatric Specialty Exam: ROS  Blood pressure 132/78, pulse 74, height 5' 5.5" (1.664 m), weight 206 lb 3.2 oz (93.5 kg).There is no height or weight on file to calculate BMI.  General Appearance: Casual  Eye Contact:  Good  Speech:  Clear and Coherent and Normal Rate  Volume:  Normal  Mood:  Euthymic  Affect:  Congruent  Thought Process:  Goal Directed  Orientation:  Full (Time, Place, and Person)  Thought Content: Logical   Suicidal Thoughts:  No  Homicidal Thoughts:  No  Memory:  Immediate;   Good Recent;   Good Remote;   Good  Judgement:  Good  Insight:  Good  Psychomotor Activity:  Normal  Concentration:  Concentration: Good and Attention Span: Good  Recall:  Good  Fund of Knowledge: Good  Language: Good  Akathisia:  No  Handed:  Right  AIMS (if indicated): not done  Assets:  Communication Skills Desire for Improvement Housing Resilience Social Support  ADL's:  Intact  Cognition: WNL  Sleep:  Good   Screenings: PHQ2-9     Office Visit from 09/28/2013 in [place] at [facility] Total Score  0       Assessment and Plan: Bipolar disorder type II.  Anxiety disorder NOS.  Patient is stable on her current psychiatric medication.  I review records from  emergency room.  She wants to continue her current psychiatric medication.  She has not taken Klonopin in a while but she like to keep it if needed.  Continue Lamictal 150 mg twice a day and Zoloft 100 mg daily.  She has refills remaining on Klonopin and does not need a new prescription.  Discussed medication side effects and benefits.  Recommended to call us back if she has any question or any concern.  Follow-up in 3 months.   Kathlee Nations, MD 05/05/2017, 4:13 PM

## 2017-08-09 ENCOUNTER — Ambulatory Visit: Payer: 59 | Admitting: Nurse Practitioner

## 2017-08-09 ENCOUNTER — Encounter: Payer: Self-pay | Admitting: Nurse Practitioner

## 2017-08-09 VITALS — BP 108/74 | HR 76 | Temp 97.5°F | Ht 65.5 in | Wt 209.0 lb

## 2017-08-09 DIAGNOSIS — E1165 Type 2 diabetes mellitus with hyperglycemia: Secondary | ICD-10-CM

## 2017-08-09 DIAGNOSIS — L247 Irritant contact dermatitis due to plants, except food: Secondary | ICD-10-CM

## 2017-08-09 LAB — GLUCOSE, POCT (MANUAL RESULT ENTRY): POC Glucose: 101 mg/dl — AB (ref 70–99)

## 2017-08-09 MED ORDER — PREDNISONE 10 MG PO TABS: ORAL_TABLET | ORAL | 0 refills | Status: DC

## 2017-08-09 MED ORDER — METHYLPREDNISOLONE ACETATE 40 MG/ML IJ SUSP
40.0000 mg | Freq: Once | INTRAMUSCULAR | Status: AC
  Administered 2017-08-09: 40 mg via INTRAMUSCULAR

## 2017-08-09 NOTE — Progress Notes (Signed)
Subjective:  Patient ID: Cheryl [name], female    DOB: 1976/04/30  Age: 42 y.o. MRN: [id number]  CC: Cheryl Ivy (rash on legs and arms. )  Cheryl [name]  This is a new problem. The current episode started in the past 7 days. The problem has been gradually worsening since onset. The rash is diffuse. The rash is characterized by blistering, redness and itchiness. She was exposed to plant contact (through pet at home). Pertinent negatives include no shortness of breath. Past treatments include anti-itch cream. The treatment provided no relief.   Outpatient Medications Prior to Visit  Medication Sig Dispense Refill  . atorvastatin (LIPITOR) 20 MG tablet TAKE 1 TABLET(20 MG) BY MOUTH DAILY AT 6 PM. REPEAT LABS ARE DUE NOW 30 tablet 0  . clonazePAM (KLONOPIN) 0.5 MG tablet   0  . ibuprofen (ADVIL,MOTRIN) 200 MG tablet Take 400 mg by mouth every 6 (six) hours as needed.    . lamoTRIgine (LAMICTAL) 150 MG tablet Take 1 tablet (150 mg total) 2 (two) times daily by mouth. Take 1 tablet in the AM, 1 tablet in the PM 60 tablet 2  . levothyroxine (SYNTHROID, LEVOTHROID) 150 MCG tablet Take 1 tablet (150 mcg total) by mouth daily. Repeat labs are due now 90 tablet 3  . naproxen sodium (ANAPROX) 220 MG tablet Take 220 mg by mouth 2 (two) times daily as needed (pain).    . norethindrone (NORLYDA) 0.35 MG tablet Take 1 tablet by mouth daily.    Glory Rosebush DELICA LANCETS FINE MISC As directed qd 100 each 1  . ONETOUCH VERIO test strip     . ONETOUCH VERIO test strip USE AS DIRECTED TWICE DAILY. 100 each 0  . sertraline (ZOLOFT) 100 MG tablet Take 1 tablet (100 mg total) daily by mouth. 30 tablet 2  . simvastatin (ZOCOR) 20 MG tablet Take 1 tablet (20 mg total) by mouth at bedtime. 30 tablet 2  . cefUROXime (CEFTIN) 500 MG tablet Take 1 tablet (500 mg total) by mouth 2 (two) times daily with a meal. (Patient not taking: Reported on 08/09/2017) 20 tablet 0   Facility-Administered Medications Prior to Visit    Medication Dose Route Frequency Provider Last Rate Last Dose  . cefTRIAXone (ROCEPHIN) injection 1 g  1 g Intramuscular Once [employer], Yvonne R, DO        ROS See HPI  Objective:  BP 108/74   Pulse 76   Temp (!) 97.5 F (36.4 C)   Ht 5' 5.5" (1.664 m)   Wt 209 lb (94.8 kg)   SpO2 97%   BMI 34.25 kg/m   BP Readings from Last 3 Encounters:  08/09/17 108/74  04/14/17 124/68  04/12/17 102/72    Wt Readings from Last 3 Encounters:  08/09/17 209 lb (94.8 kg)  04/12/17 205 lb (93 kg)  04/11/17 205 lb (93 kg)    Physical Exam  Constitutional: She is oriented to person, place, and time. No distress.  Cardiovascular: Normal rate.  Pulmonary/Chest: Effort normal.  Neurological: She is alert and oriented to person, place, and time.  Skin: Rash noted. Rash is vesicular. There is erythema.  Vitals reviewed.   Lab Results  Component Value Date   WBC 11.2 (H) 08/21/2016   HGB 14.3 08/21/2016   HCT 42.7 08/21/2016   PLT 227 08/21/2016   GLUCOSE 100 (H) 01/07/2017   CHOL 184 01/07/2017   TRIG 155.0 (H) 01/07/2017   HDL 38.50 (L) 01/07/2017   LDLDIRECT 136.0  11/19/2015   LDLCALC 115 (H) 01/07/2017   ALT 15 01/07/2017   AST 13 01/07/2017   NA 137 01/07/2017   K 4.2 01/07/2017   CL 103 01/07/2017   CREATININE 0.89 01/07/2017   BUN 16 01/07/2017   CO2 30 01/07/2017   TSH 3.54 01/07/2017   HGBA1C 5.8 01/07/2017   MICROALBUR 5.5 (H) 08/01/2015    Assessment & Plan:   Tieshia was seen today for Cheryl ivy.  Diagnoses and all orders for this visit:  Irritant contact dermatitis due to plants, except food -     predniSONE (DELTASONE) 10 MG tablet; Take 4tabs once a day x 2days, then 3tabs once a dayx 2days, then 2tabs once a day x3days, then, 1tab once a day x 3days, then stop -     methylPREDNISolone acetate (DEPO-MEDROL) injection 40 mg  Uncontrolled type 2 diabetes mellitus with hyperglycemia (HCC) -     POCT Glucose (CBG)   I have discontinued Leara L.  Stanfill "Tina"'s cefUROXime. I am also having her start on predniSONE. Additionally, I am having her maintain her naproxen sodium, ibuprofen, clonazePAM, norethindrone, ONETOUCH VERIO, levothyroxine, simvastatin, ONETOUCH VERIO, ONETOUCH DELICA LANCETS FINE, atorvastatin, sertraline, and lamoTRIgine. We administered methylPREDNISolone acetate. We will continue to administer cefTRIAXone.  Meds ordered this encounter  Medications  . predniSONE (DELTASONE) 10 MG tablet    Sig: Take 4tabs once a day x 2days, then 3tabs once a dayx 2days, then 2tabs once a day x3days, then, 1tab once a day x 3days, then stop    Dispense:  23 tablet    Refill:  0    Order Specific Question:   Supervising Provider    Answer:   Lucille Passy [[id number]]  . methylPREDNISolone acetate (DEPO-MEDROL) injection 40 mg    Follow-up: No Follow-up on file.  Wilfred Lacy, NP

## 2017-08-09 NOTE — Patient Instructions (Addendum)
Continue use of caladryl and cold compress as needed.  Monitor glucose closely due to current use of prednisone.  Call office if glucose >250 for more than 2days.  Start oral prednisone tomorrow. Contact Dermatitis Dermatitis is redness, soreness, and swelling (inflammation) of the skin. Contact dermatitis is a reaction to certain substances that touch the skin. You either touched something that irritated your skin, or you have allergies to something you touched. Follow these instructions at home: [place] your skin as needed.  Apply cool compresses to the affected areas.  Try taking a bath with: ? Epsom salts. Follow the instructions on the package. You can get these at a pharmacy or grocery store. ? Baking soda. Pour a small amount into the bath as told by your doctor. ? Colloidal oatmeal. Follow the instructions on the package. You can get this at a pharmacy or grocery store.  Try applying baking soda paste to your skin. Stir water into baking soda until it looks like paste.  Do not scratch your skin.  Bathe less often.  Bathe in lukewarm water. Avoid using hot water. Medicines  Take or apply over-the-counter and prescription medicines only as told by your doctor.  If you were prescribed an antibiotic medicine, take or apply your antibiotic as told by your doctor. Do not stop taking the antibiotic even if your condition starts to get better. General instructions  Keep all follow-up visits as told by your doctor. This is important.  Avoid the substance that caused your reaction. If you do not know what caused it, keep a journal to try to track what caused it. Write down: ? What you eat. ? What cosmetic products you use. ? What you drink. ? What you wear in the affected area. This includes jewelry.  If you were given a bandage (dressing), take care of it as told by your doctor. This includes when to change and remove it. Contact a doctor if:  You do not get  better with treatment.  Your condition gets worse.  You have signs of infection such as: ? Swelling. ? Tenderness. ? Redness. ? Soreness. ? Warmth.  You have a fever.  You have new symptoms. Get help right away if:  You have a very bad headache.  You have neck pain.  Your neck is stiff.  You throw up (vomit).  You feel very sleepy.  You see red streaks coming from the affected area.  Your bone or joint underneath the affected area becomes painful after the skin has healed.  The affected area turns darker.  You have trouble breathing. This information is not intended to replace advice given to you by your health care provider. Make sure you discuss any questions you have with your health care provider. Document Released: 04/04/2009 Document Revised: 11/13/2015 Document Reviewed: 10/23/2014 Elsevier Interactive Patient Education  2018 [employer].

## 2017-09-20 ENCOUNTER — Other Ambulatory Visit (HOSPITAL_COMMUNITY): Payer: Self-pay

## 2017-09-20 DIAGNOSIS — F3181 Bipolar II disorder: Secondary | ICD-10-CM

## 2017-09-20 MED ORDER — LAMOTRIGINE 150 MG PO TABS: 150.0000 mg | ORAL_TABLET | Freq: Two times a day (BID) | ORAL | 2 refills | Status: DC

## 2017-09-21 ENCOUNTER — Other Ambulatory Visit (HOSPITAL_COMMUNITY): Payer: Self-pay

## 2017-09-21 DIAGNOSIS — F3181 Bipolar II disorder: Secondary | ICD-10-CM

## 2017-09-21 MED ORDER — SERTRALINE HCL 100 MG PO TABS: 100.0000 mg | ORAL_TABLET | Freq: Every day | ORAL | 0 refills | Status: DC

## 2017-09-27 ENCOUNTER — Ambulatory Visit (INDEPENDENT_AMBULATORY_CARE_PROVIDER_SITE_OTHER): Payer: 59 | Admitting: Psychiatry

## 2017-09-27 ENCOUNTER — Encounter (HOSPITAL_COMMUNITY): Payer: Self-pay | Admitting: Psychiatry

## 2017-09-27 DIAGNOSIS — Z818 Family history of other mental and behavioral disorders: Secondary | ICD-10-CM | POA: Diagnosis not present

## 2017-09-27 DIAGNOSIS — F419 Anxiety disorder, unspecified: Secondary | ICD-10-CM

## 2017-09-27 DIAGNOSIS — F17210 Nicotine dependence, cigarettes, uncomplicated: Secondary | ICD-10-CM

## 2017-09-27 DIAGNOSIS — Z811 Family history of alcohol abuse and dependence: Secondary | ICD-10-CM

## 2017-09-27 DIAGNOSIS — F3181 Bipolar II disorder: Secondary | ICD-10-CM

## 2017-09-27 DIAGNOSIS — Z5689 Other problems related to employment: Secondary | ICD-10-CM

## 2017-09-27 DIAGNOSIS — R450 Nervousness: Secondary | ICD-10-CM

## 2017-09-27 MED ORDER — SERTRALINE HCL 100 MG PO TABS: 100.0000 mg | ORAL_TABLET | Freq: Every day | ORAL | 2 refills | Status: DC

## 2017-09-27 MED ORDER — CLONAZEPAM 0.5 MG PO TABS: 0.5000 mg | ORAL_TABLET | Freq: Every day | ORAL | 0 refills | Status: DC | PRN

## 2017-09-27 NOTE — Progress Notes (Signed)
BH MD/PA/NP OP Progress Note  09/27/2017 9:27 AM Cheryl [name]  MRN:  [id number]  Chief Complaint: I am not happy with my job.  It is meaningless.  HPI: Cheryl [name] came for her follow-up appointment.  She is taking Lamictal and Zoloft and reported no side effects.  She is not happy with her current job.  She is working as a Art gallery manager at [employer] but sometimes she feel it is meaningless.  She wants to work with people who have mental disorder.  She has been trying to work as a Psychologist, occupational for peers support specialist.  She admitted recently getting irritated and frustrated for no reason.  She is sleeping on and off.  She denies any mania or any psychosis and denies any feeling of hopelessness or any suicidal thoughts.  Her marriage is going very well.  She got married last September.  Her husband recently receive a good job offer.  She admitted not taking Klonopin for more than 10 months.  Her marital relationship is going well.  Recently they have a puppy.  Patient has no rash, itching or tremors.  She denies any hallucination or any paranoia.  Her energy level is good.  Her vital signs are stable.  Patient denies drinking alcohol or using any illegal substances.  Visit Diagnosis:    ICD-10-CM   1. Bipolar 2 disorder, major depressive episode (HCC) F31.81 clonazePAM (KLONOPIN) 0.5 MG tablet    sertraline (ZOLOFT) 100 MG tablet    Past Psychiatric History: Reviewed. Patient reported depression when she was studying at [employer] state school. She took Prozac and Effexor in the past. She did not like Prozac and she had withdrawals from Effexor. She was diagnosed bipolar disorder few years ago as she recall mania, mood swing and anger issues. Since then she is taking Lamictal . Patient denies any history of psychiatric inpatient treatment, suicidal attempt but admitted history of fleeting suicidal thoughts . Patient denies any self abusive behavior. She was seeing  Jobie Quaker at [facility] but not happy with the services.   Past Medical History:  Past Medical History:  Diagnosis Date  . Anxiety   . Bipolar 1 disorder ([place])   . Bipolar 2 disorder ([place])   . Depression   . Diabetes mellitus without complication ([place])   . Kidney stone   . Migraines    with aura  . OSA (obstructive sleep apnea) 10/17/2013  . Thyroid disease     Past Surgical History:  Procedure Laterality Date  . INTRAUTERINE DEVICE (IUD) INSERTION  5/13   inserted  . NASAL SEPTUM SURGERY    . OTHER SURGICAL HISTORY     endometrial polyps (2)  . WISDOM TOOTH EXTRACTION      Family Psychiatric History: Reviewed  Family History:  Family History  Problem Relation Age of Onset  . Cancer Mother 28       ovarian  . Hypothyroidism Mother   . Hypertension Father   . Diabetes Father   . Hyperlipidemia Father   . Heart disease Father        cabg--stint  . Migraines Father   . Hypothyroidism Sister   . Depression Sister   . Cancer Maternal Aunt        breast   . Cancer Maternal Uncle        throat  . COPD Maternal Grandmother   . Cancer Maternal Grandmother        lung  . Hypertension Maternal Grandmother   .  Depression Maternal Grandmother   . Stroke Maternal Grandmother   . Cancer Maternal Grandfather        prostate, lung  . Diabetes Paternal Grandmother   . Stroke Paternal Grandmother   . Alcohol abuse Paternal Grandfather   . Aneurysm Paternal Grandfather   . Depression Cousin        bipolar    Social History:  Social History   Socioeconomic History  . Marital status: Married    Spouse name: Not on file  . Number of children: Not on file  . Years of education: Not on file  . Highest education level: Not on file  Occupational History  . Occupation: Designer, multimedia: [employer]  Social Needs  . Financial resource strain: Not on file  . Food insecurity:    Worry: Not on file    Inability: Not on file  .  Transportation needs:    Medical: Not on file    Non-medical: Not on file  Tobacco Use  . Smoking status: Current Every Day Smoker    Packs/day: 1.00    Years: 20.00    Pack years: 20.00    Types: Cigarettes  . Smokeless tobacco: Never Used  Substance and Sexual Activity  . Alcohol use: No    Alcohol/week: 0.0 oz  . Drug use: No  . Sexual activity: Yes    Partners: Male    Birth control/protection: IUD  Lifestyle  . Physical activity:    Days per week: Not on file    Minutes per session: Not on file  . Stress: Not on file  Relationships  . Social connections:    Talks on phone: Not on file    Gets together: Not on file    Attends religious service: Not on file    Active member of club or organization: Not on file    Attends meetings of clubs or organizations: Not on file    Relationship status: Not on file  Other Topics Concern  . Not on file  Social History Narrative   Exercise-- no    Allergies:  Allergies  Allergen Reactions  . Food Itching    Bison-throat tingling  . Augmentin [Amoxicillin-Pot Clavulanate] Diarrhea    Metabolic Disorder Labs: Lab Results  Component Value Date   HGBA1C 5.8 01/07/2017   No results found for: PROLACTIN Lab Results  Component Value Date   CHOL 184 01/07/2017   TRIG 155.0 (H) 01/07/2017   HDL 38.50 (L) 01/07/2017   CHOLHDL 5 01/07/2017   VLDL 31.0 01/07/2017   LDLCALC 115 (H) 01/07/2017   LDLCALC 52 04/22/2016   Lab Results  Component Value Date   TSH 3.54 01/07/2017   TSH 11.31 (H) 04/22/2016    Therapeutic Level Labs: No results found for: LITHIUM No results found for: VALPROATE No components found for:  CBMZ  Current Medications: Current Outpatient Medications  Medication Sig Dispense Refill  . atorvastatin (LIPITOR) 20 MG tablet TAKE 1 TABLET(20 MG) BY MOUTH DAILY AT 6 PM. REPEAT LABS ARE DUE NOW 30 tablet 0  . clonazePAM (KLONOPIN) 0.5 MG tablet Take 1 tablet (0.5 mg total) by mouth daily as needed for  anxiety. 30 tablet 0  . ibuprofen (ADVIL,MOTRIN) 200 MG tablet Take 400 mg by mouth every 6 (six) hours as needed.    . lamoTRIgine (LAMICTAL) 150 MG tablet Take 1 tablet (150 mg total) by mouth 2 (two) times daily. Take 1 tablet in the AM, 1 tablet  in the PM 60 tablet 2  . levothyroxine (SYNTHROID, LEVOTHROID) 150 MCG tablet Take 1 tablet (150 mcg total) by mouth daily. Repeat labs are due now 90 tablet 3  . naproxen sodium (ANAPROX) 220 MG tablet Take 220 mg by mouth 2 (two) times daily as needed (pain).    . norethindrone (NORLYDA) 0.35 MG tablet Take 1 tablet by mouth daily.    Glory Rosebush DELICA LANCETS FINE MISC As directed qd 100 each 1  . ONETOUCH VERIO test strip     . ONETOUCH VERIO test strip USE AS DIRECTED TWICE DAILY. 100 each 0  . predniSONE (DELTASONE) 10 MG tablet Take 4tabs once a day x 2days, then 3tabs once a dayx 2days, then 2tabs once a day x3days, then, 1tab once a day x 3days, then stop 23 tablet 0  . sertraline (ZOLOFT) 100 MG tablet Take 1 tablet (100 mg total) by mouth daily. 30 tablet 2  . simvastatin (ZOCOR) 20 MG tablet Take 1 tablet (20 mg total) by mouth at bedtime. 30 tablet 2   Current Facility-Administered Medications  Medication Dose Route Frequency Provider Last Rate Last Dose  . cefTRIAXone (ROCEPHIN) injection 1 g  1 g Intramuscular Once Ann Held, DO         Musculoskeletal: Strength & Muscle Tone: within normal limits Gait & Station: normal Patient leans: N/A  Psychiatric Specialty Exam: Review of Systems  Constitutional: Negative.   HENT: Negative.   Respiratory: Negative.   Cardiovascular: Negative.   Skin: Negative.   Psychiatric/Behavioral: The patient is nervous/anxious.     Blood pressure 126/70, pulse 88, height 5' 5.5" (1.664 m), weight 209 lb (94.8 kg).Body mass index is 34.25 kg/m.  General Appearance: Casual  Eye Contact:  Good  Speech:  Clear and Coherent  Volume:  Normal  Mood:  Anxious  Affect:  Congruent   Thought Process:  Goal Directed  Orientation:  Full (Time, Place, and Person)  Thought Content: Rumination   Suicidal Thoughts:  No  Homicidal Thoughts:  No  Memory:  Immediate;   Good Recent;   Good Remote;   Good  Judgement:  Good  Insight:  Good  Psychomotor Activity:  Normal  Concentration:  Concentration: Good and Attention Span: Fair  Recall:  Good  Fund of Knowledge: Good  Language: Good  Akathisia:  No  Handed:  Right  AIMS (if indicated): not done  Assets:  Communication Skills Desire for Improvement Housing Resilience Social Support Talents/Skills  ADL's:  Intact  Cognition: WNL  Sleep:  Fair   Screenings: PHQ2-9     Office Visit from 09/28/2013 in [place] at [facility] Total Score  0       Assessment and Plan: Bipolar disorder type II.  Anxiety disorder NOS.  Reassurance given.  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  Information provided to contact [facility] mental health ACT team.  I also encouraged to take Klonopin 0.5 mg which she used to take it to help her anxiety.  Patient is not interested in counseling.  She has no rash or itching from the Lamictal.  Continue Lamictal 150 mg twice a day and Zoloft 100 mg daily.  Recommended to call us back if she has any question, concern or if she feels worsening of the symptoms.  Discussed safety concerns at any time having active suicidal thoughts or homicidal thought that she need to call  911 or go to local emergency room.  Follow-up in 3 months.     Kathlee Nations, MD 09/27/2017, 9:27 AM

## 2017-10-10 ENCOUNTER — Other Ambulatory Visit: Payer: Self-pay | Admitting: Certified Nurse Midwife

## 2017-10-10 DIAGNOSIS — Z30011 Encounter for initial prescription of contraceptive pills: Secondary | ICD-10-CM

## 2017-10-10 NOTE — Telephone Encounter (Signed)
Message left to return call to Cheryl [name] at [phone].    

## 2017-10-11 NOTE — Telephone Encounter (Signed)
Message left to return call to [facility] at [phone] to confirm pharmacy for refill of OCP. Multiple pharmacies listed on profile.

## 2017-10-12 ENCOUNTER — Other Ambulatory Visit: Payer: Self-pay | Admitting: Cardiology

## 2017-10-12 ENCOUNTER — Encounter: Payer: Self-pay | Admitting: Certified Nurse Midwife

## 2017-10-12 ENCOUNTER — Ambulatory Visit: Payer: 59 | Admitting: Certified Nurse Midwife

## 2017-10-12 ENCOUNTER — Other Ambulatory Visit: Payer: Self-pay

## 2017-10-12 ENCOUNTER — Other Ambulatory Visit (HOSPITAL_COMMUNITY)
Admission: RE | Admit: 2017-10-12 | Discharge: 2017-10-12 | Disposition: A | Discharge status: Home / Self Care | Payer: 59 | Source: Ambulatory Visit | Attending: Obstetrics & Gynecology | Admitting: Obstetrics & Gynecology

## 2017-10-12 VITALS — BP 110/70 | HR 70 | Resp 16 | Ht 66.0 in | Wt 209.0 lb

## 2017-10-12 DIAGNOSIS — N6310 Unspecified lump in the right breast, unspecified quadrant: Secondary | ICD-10-CM

## 2017-10-12 DIAGNOSIS — F17200 Nicotine dependence, unspecified, uncomplicated: Secondary | ICD-10-CM

## 2017-10-12 DIAGNOSIS — Z124 Encounter for screening for malignant neoplasm of cervix: Secondary | ICD-10-CM | POA: Insufficient documentation

## 2017-10-12 DIAGNOSIS — Z3041 Encounter for surveillance of contraceptive pills: Secondary | ICD-10-CM

## 2017-10-12 DIAGNOSIS — Z01411 Encounter for gynecological examination (general) (routine) with abnormal findings: Secondary | ICD-10-CM

## 2017-10-12 DIAGNOSIS — F17210 Nicotine dependence, cigarettes, uncomplicated: Secondary | ICD-10-CM

## 2017-10-12 MED ORDER — NORETHINDRONE 0.35 MG PO TABS: 1.0000 | ORAL_TABLET | Freq: Every day | ORAL | 4 refills | Status: DC

## 2017-10-12 NOTE — Progress Notes (Signed)
Patient scheduled while in office for bilateral Dx MMG and right breast US at [place] on 10/18/17 arriving at 2:10pm for 2:30pm appointment. Patient verbalizes understanding and is agreeable.

## 2017-10-12 NOTE — Progress Notes (Signed)
42 y.o. G0P0000 Married  Caucasian Fe here for annual exam. Periods have been lighter and missed one period last year. POP contraception working well, desires continuance. Still smoking, but less. Working on using lozenges to decrease smoking. Patient worked on weight loss last year for wedding, but. has gained back 8 pounds.Eating less fast foods now and working on decreasing weight now. Aware she needs to add  exercise to help with health. Continues smoking, but has decreased amount. Not interested in smoking cessation at this point.  Sees PCP yearly Dr. Cheri Rous for medication management of cholesterol, hypothyroid, labs, aex. Sees Psychiatry for management of bipolar and depression, stable medications per patient. Enjoying married life! No other health issues today.  No LMP recorded. (Menstrual status: Oral contraceptives).          Sexually active: Yes.    The current method of family planning is oral progesterone-only contraceptive.    Exercising: No.  exercise Smoker:  yes  Health Maintenance: Pap:  08-04-16 ASCUS HPV HR neg History of Abnormal Pap: no MMG:  03-24-16 category b density birads 1:neg Self Breast exams: no Colonoscopy:  2010 BMD:   none TDaP:  2018 Shingles: no Pneumonia: no Hep C and HIV: HIV neg in college Labs: PCP   reports that she has been smoking cigarettes.  She has a 20.00 pack-year smoking history. She has never used smokeless tobacco. She reports that she does not drink alcohol or use drugs.  Past Medical History:  Diagnosis Date  . Anxiety   . Bipolar 1 disorder ([place])   . Bipolar 2 disorder ([place])   . Depression   . Diabetes mellitus without complication ([place])   . Kidney stone   . Migraines    with aura  . OSA (obstructive sleep apnea) 10/17/2013  . Thyroid disease     Past Surgical History:  Procedure Laterality Date  . INTRAUTERINE DEVICE (IUD) INSERTION  10/2011   inserted & removed 2018  . NASAL SEPTUM SURGERY    . OTHER SURGICAL HISTORY     endometrial polyps (2)  . WISDOM TOOTH EXTRACTION      Current Outpatient Medications  Medication Sig Dispense Refill  . ibuprofen (ADVIL,MOTRIN) 200 MG tablet Take 400 mg by mouth every 6 (six) hours as needed.    . lamoTRIgine (LAMICTAL) 150 MG tablet Take 1 tablet (150 mg total) by mouth 2 (two) times daily. Take 1 tablet in the AM, 1 tablet in the PM 60 tablet 2  . levothyroxine (SYNTHROID, LEVOTHROID) 150 MCG tablet Take 1 tablet (150 mcg total) by mouth daily. Repeat labs are due now 90 tablet 3  . naproxen sodium (ANAPROX) 220 MG tablet Take 220 mg by mouth 2 (two) times daily as needed (pain).    . norethindrone (NORLYDA) 0.35 MG tablet Take 1 tablet by mouth daily.    Glory Rosebush DELICA LANCETS FINE MISC As directed qd 100 each 1  . ONETOUCH VERIO test strip     . sertraline (ZOLOFT) 100 MG tablet Take 1 tablet (100 mg total) by mouth daily. 30 tablet 2  . simvastatin (ZOCOR) 20 MG tablet Take 1 tablet (20 mg total) by mouth at bedtime. 30 tablet 2  . clonazePAM (KLONOPIN) 0.5 MG tablet Take 1 tablet (0.5 mg total) by mouth daily as needed for anxiety. 30 tablet 0   No current facility-administered medications for this visit.     Family History  Problem Relation Age of Onset  . Cancer Mother 73  ovarian  . Hypothyroidism Mother   . Hypertension Father   . Diabetes Father   . Hyperlipidemia Father   . Heart disease Father        cabg--stint  . Migraines Father   . Hypothyroidism Sister   . Depression Sister   . Cancer Maternal Aunt        breast   . Cancer Maternal Uncle        throat  . COPD Maternal Grandmother   . Cancer Maternal Grandmother        lung  . Hypertension Maternal Grandmother   . Depression Maternal Grandmother   . Stroke Maternal Grandmother   . Cancer Maternal Grandfather        prostate, lung  . Diabetes Paternal Grandmother   . Stroke Paternal Grandmother   . Alcohol abuse Paternal Grandfather   . Aneurysm Paternal Grandfather   .  Depression Cousin        bipolar    ROS:  Pertinent items are noted in HPI.  Otherwise, a comprehensive ROS was negative.  Exam:   BP 110/70   Pulse 70   Resp 16   Ht 5\' 6"  (1.676 m)   Wt 209 lb (94.8 kg)   BMI 33.73 kg/m  Height: 5\' 6"  (167.6 cm) Ht Readings from Last 3 Encounters:  10/12/17 5\' 6"  (1.676 m)  08/09/17 5' 5.5" (1.664 m)  04/12/17 5\' 6"  (1.676 m)    General appearance: alert, cooperative and appears stated age Head: Normocephalic, without obvious abnormality, atraumatic Neck: no adenopathy, supple, symmetrical, trachea midline and thyroid normal to inspection and palpation Lungs: clear to auscultation bilaterally Breasts: normal appearance, no masses or tenderness, No nipple retraction or dimpling, No nipple discharge or bleeding, No axillary or supraclavicular adenopathy Right breast mass noted 7-8 o'clock 1-2 fb from edge cystic? feel ,mobile non tender, patient palpated area also. Breasts feels fibroglandular in this area, but this area more definitive Heart: regular rate and rhythm Abdomen: soft, non-tender; no masses,  no organomegaly Extremities: extremities normal, atraumatic, no cyanosis or edema Skin: Skin color, texture, turgor normal. No rashes or lesions Lymph nodes: Cervical, supraclavicular, and axillary nodes normal. No abnormal inguinal nodes palpated Neurologic: Grossly normal   Pelvic: External genitalia:  no lesions              Urethra:  normal appearing urethra with no masses, tenderness or lesions              Bartholin's and Skene's: normal                 Vagina: normal appearing vagina with normal color and discharge, no lesions              Cervix: no cervical motion tenderness, no lesions and nulliparous appearance              Pap taken: Yes.   Bimanual Exam:  Uterus:  normal size, contour, position, consistency, mobility, non-tender and anteflexed              Adnexa: normal adnexa and no mass, fullness, tenderness                Rectovaginal: Confirms               Anus:  normal sphincter tone, no lesions  Chaperone present: yes  A:  Well Woman with normal exam  Contraception POP desired  Follow up pap smear from ASCUS pap today  Right breast mass  Smoker  Hypothyroid/cholesterol management with PCP  Bipolar/depression history with MD management  P:   Reviewed health and wellness pertinent to exam  Reviewed risks/benefits/side effects/warning signs and bleeding expectations with POP desires continuance  Rx Norlyda see order with instructions  Discussed right breast mass? Cyst? Fibroglandular tissue finding and recommend evaluation with diagnostic mammogram and [place]. Patient agreeable and will be scheduled prior to leaving today.  Stressed decrease smoking for better health and decrease of lung cancer.  Continue follow up with MD as indicated  Pap smear: yes   counseled on breast self exam, mammography screening, adequate intake of calcium and vitamin D, diet and exercise  return annually or prn  An After Visit Summary was printed and given to the patient.

## 2017-10-13 DIAGNOSIS — F17210 Nicotine dependence, cigarettes, uncomplicated: Secondary | ICD-10-CM | POA: Insufficient documentation

## 2017-10-17 LAB — CYTOLOGY - PAP
DIAGNOSIS: UNDETERMINED — AB
HPV: NOT DETECTED

## 2017-10-18 ENCOUNTER — Ambulatory Visit
Admission: RE | Admit: 2017-10-18 | Discharge: 2017-10-18 | Disposition: A | Discharge status: Home / Self Care | Payer: 59 | Source: Ambulatory Visit | Attending: Certified Nurse Midwife | Admitting: Certified Nurse Midwife

## 2017-10-18 ENCOUNTER — Telehealth: Payer: Self-pay | Admitting: *Deleted

## 2017-10-18 DIAGNOSIS — N6310 Unspecified lump in the right breast, unspecified quadrant: Secondary | ICD-10-CM

## 2017-10-18 NOTE — Telephone Encounter (Signed)
Notes recorded by Burnice Logan, RN on 10/18/2017 at 11:58 AM EDT 08 recall placed ------  Notes recorded by Burnice Logan, RN on 10/18/2017 at 11:58 AM EDT Left message to call Sharee Pimple at [phone].  Notes recorded by Regina Eck, CNM on 10/18/2017 at 7:56 AM EDT Pap smear ASCUS again but HPV not detected 08

## 2017-10-19 NOTE — Telephone Encounter (Signed)
Spoke with patient, advised of both results as seen below per Melvia Heaps, CNM. OV scheduled for 5/16 at 4pm with Melvia Heaps, CNM. Patient verbalizes understanding and is agreeable. Will close encounter.

## 2017-10-19 NOTE — Telephone Encounter (Signed)
-----   Message from Regina Eck, CNM sent at 10/19/2017  7:24 AM EDT ----- Reviewed mammogram report and fibroglandular tissue noted and thickening in area of concern, but no mass. Would like to recheck area in 2 weeks.

## 2017-10-24 ENCOUNTER — Telehealth: Payer: Self-pay

## 2017-10-24 NOTE — Telephone Encounter (Signed)
-----   Message from Regina Eck, CNM sent at 10/21/2017  7:45 AM EDT ----- Reviewed mammogram results felt to breast ridge per [place] and mammogram with  Benign finding. Would like to recheck in 2-3 weeks.

## 2017-10-24 NOTE — Telephone Encounter (Signed)
Spoke with patient. Patient states that she already spoke to someone last week regarding results. Per review of phone notes patient spoke with Glorianne Manchester. RN and is scheduled for follow up on 11/03/2017 at 4 pm. Apologized to patient for duplicate call. Encounter closed.

## 2017-11-03 ENCOUNTER — Ambulatory Visit: Payer: Self-pay | Admitting: Certified Nurse Midwife

## 2017-11-09 ENCOUNTER — Ambulatory Visit: Payer: Self-pay | Admitting: Certified Nurse Midwife

## 2017-11-09 ENCOUNTER — Other Ambulatory Visit (HOSPITAL_COMMUNITY): Payer: Self-pay

## 2017-11-09 DIAGNOSIS — F3181 Bipolar II disorder: Secondary | ICD-10-CM

## 2017-11-09 MED ORDER — SERTRALINE HCL 100 MG PO TABS: 100.0000 mg | ORAL_TABLET | Freq: Every day | ORAL | 2 refills | Status: DC

## 2017-11-15 ENCOUNTER — Ambulatory Visit: Payer: Self-pay | Admitting: Certified Nurse Midwife

## 2017-11-18 ENCOUNTER — Other Ambulatory Visit: Payer: Self-pay

## 2017-11-18 ENCOUNTER — Ambulatory Visit (INDEPENDENT_AMBULATORY_CARE_PROVIDER_SITE_OTHER): Payer: 59 | Admitting: Certified Nurse Midwife

## 2017-11-18 ENCOUNTER — Encounter: Payer: Self-pay | Admitting: Certified Nurse Midwife

## 2017-11-18 VITALS — BP 110/62 | HR 68 | Resp 16 | Ht 66.0 in | Wt 214.0 lb

## 2017-11-18 DIAGNOSIS — Z0000 Encounter for general adult medical examination without abnormal findings: Secondary | ICD-10-CM | POA: Diagnosis not present

## 2017-11-18 NOTE — Progress Notes (Signed)
   Subjective:   42 y.o. MarriedCaucasian female presents for follow up of right ? breast mass, noted at last exam. Periods were changing and thought may have been premenstrual. Mammogram and US findings were  Benign. Patient has not noted any changes or masses. Has missed some of her OCP this month, so plans to do UPT once she is home. If positive will advise. No other health concerns today.  Review of Systems Pertinent items are noted in HPI.   Objective:   General appearance: alert, cooperative, appears stated age and no distress  Breasts: normal appearance, no masses or tenderness, No nipple retraction or dimpling, No nipple discharge or bleeding, No axillary or supraclavicular adenopathy, area of concern no longer palpated in right breast   Assessment:   ASSESSMENT:Patient is diagnosed with normal breast exam   Plan:   PLAN: Continue to do self breast exam and notify if changes. Notify if UPT positive.  Stressed consistent use of contraception.

## 2017-12-27 ENCOUNTER — Other Ambulatory Visit (HOSPITAL_COMMUNITY): Payer: Self-pay

## 2017-12-27 DIAGNOSIS — F3181 Bipolar II disorder: Secondary | ICD-10-CM

## 2017-12-27 MED ORDER — LAMOTRIGINE 150 MG PO TABS: 150.0000 mg | ORAL_TABLET | Freq: Two times a day (BID) | ORAL | 0 refills | Status: DC

## 2017-12-28 ENCOUNTER — Ambulatory Visit (HOSPITAL_COMMUNITY): Payer: Self-pay | Admitting: Psychiatry

## 2017-12-30 ENCOUNTER — Other Ambulatory Visit (HOSPITAL_COMMUNITY): Payer: Self-pay | Admitting: Psychiatry

## 2017-12-30 DIAGNOSIS — F3181 Bipolar II disorder: Secondary | ICD-10-CM

## 2018-01-19 ENCOUNTER — Other Ambulatory Visit (HOSPITAL_COMMUNITY): Payer: Self-pay | Admitting: Psychiatry

## 2018-01-19 DIAGNOSIS — F3181 Bipolar II disorder: Secondary | ICD-10-CM

## 2018-02-01 ENCOUNTER — Other Ambulatory Visit (HOSPITAL_COMMUNITY): Payer: Self-pay | Admitting: Psychiatry

## 2018-02-01 DIAGNOSIS — F3181 Bipolar II disorder: Secondary | ICD-10-CM

## 2018-02-02 ENCOUNTER — Ambulatory Visit (HOSPITAL_COMMUNITY): Payer: Self-pay | Admitting: Psychiatry

## 2018-02-03 ENCOUNTER — Other Ambulatory Visit (HOSPITAL_COMMUNITY): Payer: Self-pay

## 2018-02-03 DIAGNOSIS — F3181 Bipolar II disorder: Secondary | ICD-10-CM

## 2018-02-03 MED ORDER — LAMOTRIGINE 150 MG PO TABS: 150.0000 mg | ORAL_TABLET | Freq: Two times a day (BID) | ORAL | 0 refills | Status: DC

## 2018-03-03 ENCOUNTER — Other Ambulatory Visit (HOSPITAL_COMMUNITY): Payer: Self-pay | Admitting: Psychiatry

## 2018-03-03 DIAGNOSIS — F3181 Bipolar II disorder: Secondary | ICD-10-CM

## 2018-03-07 ENCOUNTER — Other Ambulatory Visit (HOSPITAL_COMMUNITY): Payer: Self-pay | Admitting: Psychiatry

## 2018-03-07 DIAGNOSIS — F3181 Bipolar II disorder: Secondary | ICD-10-CM

## 2018-03-13 ENCOUNTER — Ambulatory Visit (INDEPENDENT_AMBULATORY_CARE_PROVIDER_SITE_OTHER): Payer: 59 | Admitting: Psychiatry

## 2018-03-13 ENCOUNTER — Encounter (HOSPITAL_COMMUNITY): Payer: Self-pay | Admitting: Psychiatry

## 2018-03-13 DIAGNOSIS — F3181 Bipolar II disorder: Secondary | ICD-10-CM

## 2018-03-13 DIAGNOSIS — F419 Anxiety disorder, unspecified: Secondary | ICD-10-CM

## 2018-03-13 DIAGNOSIS — F17210 Nicotine dependence, cigarettes, uncomplicated: Secondary | ICD-10-CM

## 2018-03-13 DIAGNOSIS — Z818 Family history of other mental and behavioral disorders: Secondary | ICD-10-CM | POA: Diagnosis not present

## 2018-03-13 DIAGNOSIS — Z811 Family history of alcohol abuse and dependence: Secondary | ICD-10-CM

## 2018-03-13 MED ORDER — LAMOTRIGINE 150 MG PO TABS: 150.0000 mg | ORAL_TABLET | Freq: Two times a day (BID) | ORAL | 0 refills | Status: DC

## 2018-03-13 MED ORDER — CLONAZEPAM 0.5 MG PO TABS: 0.5000 mg | ORAL_TABLET | Freq: Every day | ORAL | 0 refills | Status: DC | PRN

## 2018-03-13 MED ORDER — SERTRALINE HCL 100 MG PO TABS: 150.0000 mg | ORAL_TABLET | Freq: Every day | ORAL | 1 refills | Status: DC

## 2018-03-13 NOTE — Progress Notes (Signed)
Cheryl Blue MD/PA/NP OP Progress Note  03/13/2018 10:33 AM Cheryl [name]  MRN:  [id number]  Chief Complaint: I am not feeling very good.  There are days when I feel very sad and depressed.  I am not happy with my current job.  HPI: Cheryl [name] came for her follow-up appointment.  She admitted lately not doing very well because she is very stressed about her job.  She is actively looking for a new job.  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  She had applied places to get volunteer but so far she has not heard from them.  She also endorsed picking up the smoking and there are times when she admitted drinking alcohol but denies any intoxication or any withdrawal.  She does not want to drink or smoke but admitted due to stress sometimes she does.  She endorsed some time careless as not watching her diet and has not done monitoring her blood sugar.  Her marriage is going well.  Next week she is going to [facility] with the spouse and she is excited about it.  This is her first year marriage anniversary.  She admitted taking Klonopin recently to help her anxiety.  Patient denies any paranoia, hallucination, suicidal thoughts or homicidal thought.  She admitted some time irritability but denies any mania or psychosis.  She has no tremors, shakes, rash or any itching.  She is compliant with Klonopin, Zoloft and Lamictal.  She is not interested in counseling.  Visit Diagnosis:    ICD-10-CM   1. Bipolar 2 disorder, major depressive episode (HCC) F31.81 sertraline (ZOLOFT) 100 MG tablet    lamoTRIgine (LAMICTAL) 150 MG tablet    clonazePAM (KLONOPIN) 0.5 MG tablet    Past Psychiatric History: Reviewed. Patient reported depression when she was studying at [employer] state [employer]. She took Prozac, wellbutrin and Effexor in the past. She did not like Prozac and she had withdrawals from Effexor. She was diagnosed bipolar disorder few years ago as she recall mania, mood swing and anger issues. Since  then she is taking Lamictal . Patient denies any history of psychiatric inpatient treatment, suicidal attempt but admitted history of fleeting suicidal thoughts . Patient denies any self abusive behavior. She was seeing Jobie Quaker at [facility] but not happy with the services.   Past Medical History:  Past Medical History:  Diagnosis Date  . Anxiety   . Bipolar 1 disorder ([place])   . Bipolar 2 disorder ([place])   . Depression   . Diabetes mellitus without complication ([place])   . Kidney stone   . Migraines    with aura  . OSA (obstructive sleep apnea) 10/17/2013  . Thyroid disease     Past Surgical History:  Procedure Laterality Date  . INTRAUTERINE DEVICE (IUD) INSERTION  10/2011   inserted & removed 2018  . NASAL SEPTUM SURGERY    . OTHER SURGICAL HISTORY     endometrial polyps (2)  . WISDOM TOOTH EXTRACTION      Family Psychiatric History: Reviewed.  Family History:  Family History  Problem Relation Age of Onset  . Cancer Mother 35       ovarian  . Hypothyroidism Mother   . Hypertension Father   . Diabetes Father   . Hyperlipidemia Father   . Heart disease Father        cabg--stint  . Migraines Father   . Hypothyroidism Sister   . Depression Sister   . Cancer Maternal Aunt  breast   . Cancer Maternal Uncle        throat  . COPD Maternal Grandmother   . Cancer Maternal Grandmother        lung  . Hypertension Maternal Grandmother   . Depression Maternal Grandmother   . Stroke Maternal Grandmother   . Cancer Maternal Grandfather        prostate, lung  . Diabetes Paternal Grandmother   . Stroke Paternal Grandmother   . Alcohol abuse Paternal Grandfather   . Aneurysm Paternal Grandfather   . Depression Cousin        bipolar    Social History:  Social History   Socioeconomic History  . Marital status: Married    Spouse name: Not on file  . Number of children: Not on file  . Years of education: Not on file  . Highest education level: Not on  file  Occupational History  . Occupation: Designer, multimedia: [employer]  Social Needs  . Financial resource strain: Not on file  . Food insecurity:    Worry: Not on file    Inability: Not on file  . Transportation needs:    Medical: Not on file    Non-medical: Not on file  Tobacco Use  . Smoking status: Current Every Day Smoker    Packs/day: 1.00    Years: 20.00    Pack years: 20.00    Types: Cigarettes  . Smokeless tobacco: Never Used  Substance and Sexual Activity  . Alcohol use: No    Alcohol/week: 0.0 standard drinks  . Drug use: No  . Sexual activity: Yes    Partners: Male    Birth control/protection: Pill  Lifestyle  . Physical activity:    Days per week: Not on file    Minutes per session: Not on file  . Stress: Not on file  Relationships  . Social connections:    Talks on phone: Not on file    Gets together: Not on file    Attends religious service: Not on file    Active member of club or organization: Not on file    Attends meetings of clubs or organizations: Not on file    Relationship status: Not on file  Other Topics Concern  . Not on file  Social History Narrative   Exercise-- no    Allergies:  Allergies  Allergen Reactions  . Food Itching    Bison-throat tingling  . Augmentin [Amoxicillin-Pot Clavulanate] Diarrhea    Metabolic Disorder Labs: Lab Results  Component Value Date   HGBA1C 5.8 01/07/2017   No results found for: PROLACTIN Lab Results  Component Value Date   CHOL 184 01/07/2017   TRIG 155.0 (H) 01/07/2017   HDL 38.50 (L) 01/07/2017   CHOLHDL 5 01/07/2017   VLDL 31.0 01/07/2017   LDLCALC 115 (H) 01/07/2017   LDLCALC 52 04/22/2016   Lab Results  Component Value Date   TSH 3.54 01/07/2017   TSH 11.31 (H) 04/22/2016    Therapeutic Level Labs: No results found for: LITHIUM No results found for: VALPROATE No components found for:  CBMZ  Current Medications: Current Outpatient Medications   Medication Sig Dispense Refill  . clonazePAM (KLONOPIN) 0.5 MG tablet Take 1 tablet (0.5 mg total) by mouth daily as needed for anxiety. 30 tablet 0  . ibuprofen (ADVIL,MOTRIN) 200 MG tablet Take 400 mg by mouth every 6 (six) hours as needed.    . lamoTRIgine (LAMICTAL) 150 MG tablet Take 1  tablet (150 mg total) by mouth 2 (two) times daily. Take 1 tablet in the AM, 1 tablet in the PM 60 tablet 0  . levothyroxine (SYNTHROID, LEVOTHROID) 150 MCG tablet Take 1 tablet (150 mcg total) by mouth daily. Repeat labs are due now 90 tablet 3  . naproxen sodium (ANAPROX) 220 MG tablet Take 220 mg by mouth 2 (two) times daily as needed (pain).    . norethindrone (NORLYDA) 0.35 MG tablet Take 1 tablet (0.35 mg total) by mouth daily. 3 Package 4  . ONETOUCH DELICA LANCETS FINE MISC As directed qd 100 each 1  . ONETOUCH VERIO test strip     . sertraline (ZOLOFT) 100 MG tablet Take 1 tablet (100 mg total) by mouth daily. 30 tablet 2  . simvastatin (ZOCOR) 20 MG tablet Take 1 tablet (20 mg total) by mouth at bedtime. 30 tablet 2   No current facility-administered medications for this visit.      Musculoskeletal: Strength & Muscle Tone: within normal limits Gait & Station: normal Patient leans: N/A  Psychiatric Specialty Exam: ROS  Blood pressure 113/77, pulse 80, height 5\' 6"  (1.676 m), weight 219 lb (99.3 kg), SpO2 95 %.There is no height or weight on file to calculate BMI.  General Appearance: Casual  Eye Contact:  Good  Speech:  Clear and Coherent  Volume:  Normal  Mood:  Depressed  Affect:  Congruent  Thought Process:  Goal Directed  Orientation:  Full (Time, Place, and Person)  Thought Content: Rumination   Suicidal Thoughts:  No  Homicidal Thoughts:  No  Memory:  Immediate;   Good Recent;   Good Remote;   Good  Judgement:  Good  Insight:  Good  Psychomotor Activity:  Normal  Concentration:  Concentration: Good and Attention Span: Good  Recall:  Good  Fund of Knowledge: Good   Language: Good  Akathisia:  No  Handed:  Right  AIMS (if indicated): not done  Assets:  Communication Skills Desire for Improvement Housing  ADL's:  Intact  Cognition: WNL  Sleep:  Fair   Screenings: PHQ2-9     Office Visit from 09/28/2013 in [employer] at [facility] Total Score  0       Assessment and Plan: Bipolar disorder type II.  Anxiety disorder NOS.  Reassurance given.  Encourage to continue to explore volunteer job with mental health population as patient like to work with them.  Encourage to contact [place] but she has not done so far.  Encouraged to watch her diet and closely monitor her blood sugar.  I recommended to try Zoloft 150 mg daily.  Continue Lamictal 150 mg twice a day and Klonopin 0.5 mg as needed for anxiety.  Patient is not interested in counseling.  Discussed safety concerns at any time having active suicidal thoughts or homicidal thought and she need to call 911 or go to local emergency room.  I will see her again in 6 weeks.   Kathlee Nations, MD 03/13/2018, 10:33 AM

## 2018-04-06 ENCOUNTER — Ambulatory Visit (HOSPITAL_COMMUNITY): Payer: Self-pay | Admitting: Psychiatry

## 2018-04-12 ENCOUNTER — Other Ambulatory Visit: Payer: Self-pay | Admitting: *Deleted

## 2018-04-12 DIAGNOSIS — E039 Hypothyroidism, unspecified: Secondary | ICD-10-CM

## 2018-04-12 MED ORDER — LEVOTHYROXINE SODIUM 150 MCG PO TABS: 175.0000 ug | ORAL_TABLET | Freq: Every day | ORAL | 0 refills | Status: DC

## 2018-04-13 ENCOUNTER — Other Ambulatory Visit (HOSPITAL_COMMUNITY): Payer: Self-pay | Admitting: Psychiatry

## 2018-04-13 ENCOUNTER — Other Ambulatory Visit (HOSPITAL_COMMUNITY): Payer: Self-pay

## 2018-04-13 DIAGNOSIS — F3181 Bipolar II disorder: Secondary | ICD-10-CM

## 2018-04-13 MED ORDER — LAMOTRIGINE 150 MG PO TABS: 150.0000 mg | ORAL_TABLET | Freq: Two times a day (BID) | ORAL | 0 refills | Status: DC

## 2018-04-25 ENCOUNTER — Telehealth (HOSPITAL_COMMUNITY): Payer: Self-pay

## 2018-04-25 NOTE — Telephone Encounter (Signed)
Thanks for the update

## 2018-04-25 NOTE — Telephone Encounter (Signed)
Patient had to cancel her appointment this week due to a scheduling conflict. She said she wants you to know that the Zoloft is working great, she is in a better place and will reschedule to see you soon.

## 2018-04-27 ENCOUNTER — Ambulatory Visit (HOSPITAL_COMMUNITY): Payer: 59 | Admitting: Psychiatry

## 2018-05-16 ENCOUNTER — Telehealth: Payer: Self-pay | Admitting: Family Medicine

## 2018-05-16 DIAGNOSIS — L304 Erythema intertrigo: Secondary | ICD-10-CM

## 2018-05-16 MED ORDER — NYSTATIN 100000 UNIT/GM EX CREA: 1.0000 "application " | TOPICAL_CREAM | Freq: Two times a day (BID) | CUTANEOUS | 0 refills | Status: DC

## 2018-05-16 NOTE — Progress Notes (Signed)
E Visit for Rash  We are sorry that you are not feeling well. Here is how we plan to help!  For the area under your breast please see prescription and information below.  Based upon your presentation it appears you have a fungal infection.  I have prescribed: and Nystatin cream apply to the affected area twice daily Keep applying for up to 5 days after skin is cleared of lesions.  I can not really see the arm lesion and advise you seek face to face care or continue to monitor. Information on face to face care is included below. Thanks.    HOME CARE:   Take cool showers and avoid direct sunlight.  Apply cool compress or wet dressings.  Take a bath in an oatmeal bath.  Sprinkle content of one Aveeno packet under running faucet with comfortably warm water.  Bathe for 15-20 minutes, 1-2 times daily.  Pat dry with a towel. Do not rub the rash.  Use hydrocortisone cream.  Take an antihistamine like Benadryl for widespread rashes that itch.  The adult dose of Benadryl is 25-50 mg by mouth 4 times daily.  Caution:  This type of medication may cause sleepiness.  Do not drink alcohol, drive, or operate dangerous machinery while taking antihistamines.  Do not take these medications if you have prostate enlargement.  Read package instructions thoroughly on all medications that you take.  GET HELP RIGHT AWAY IF:   Symptoms don't go away after treatment.  Severe itching that persists.  If you rash spreads or swells.  If you rash begins to smell.  If it blisters and opens or develops a yellow-brown crust.  You develop a fever.  You have a sore throat.  You become short of breath.  MAKE SURE YOU:  Understand these instructions. Will watch your condition. Will get help right away if you are not doing well or get worse.  Thank you for choosing an e-visit. Your e-visit answers were reviewed by a board certified advanced clinical practitioner to complete your personal care plan.  Depending upon the condition, your plan could have included both over the counter or prescription medications. Please review your pharmacy choice. Be sure that the pharmacy you have chosen is open so that you can pick up your prescription now.  If there is a problem you may message your provider in [place] to have the prescription routed to another pharmacy. Your safety is important to [place]. If you have drug allergies check your prescription carefully.  For the next 24 hours, you can use MyChart to ask questions about today's visit, request a non-urgent call back, or ask for a work or school excuse from your e-visit provider. You will get an email in the next two days asking about your experience. I hope that your e-visit has been valuable and will speed your recovery.  Based on what you shared with me it looks like you have a condition that should be evaluated in a face to face office visit.     If you are having a true medical emergency please call 911.  If you need an urgent face to face visit, [facility] has four urgent care centers for your convenience.  If you need care fast and have a high deductible or no insurance consider:   [email or web address] to reserve your spot online an avoid wait times  [facility] [address] [place], [place] [address] 8 am to 8 pm Monday-Friday 10 am to 4 pm Saturday-Sunday *  Across the street from [place]  [place], [address] 8 am to 5 pm Monday-Friday * In the [facility] on the [facility]   The following sites will take your  insurance:  . [facility] [place] a Provider at this Location  [address] [place], [place] [address] . 10 am to 8 pm Monday-Friday . 12 pm to 8 pm Saturday-Sunday   . [facility] at [place] a  Provider at this Location  [address], [place], [place] [address] . 8 am to 8 pm Monday-Friday . 9 am to 6 pm Saturday . 11 am to 6 pm Sunday   . [facility] at [place] Get Driving Directions  [address] [place], [place] [address] . 8 am to 8 pm Monday-Friday . 8 am to 4 pm Saturday-Sunday   Your e-visit answers were reviewed by a board certified advanced clinical practitioner to complete your personal care plan.  Thank you for using e-Visits.

## 2018-05-17 ENCOUNTER — Other Ambulatory Visit (HOSPITAL_COMMUNITY): Payer: Self-pay

## 2018-05-17 ENCOUNTER — Other Ambulatory Visit: Payer: Self-pay | Admitting: Family Medicine

## 2018-05-17 DIAGNOSIS — F3181 Bipolar II disorder: Secondary | ICD-10-CM

## 2018-05-17 DIAGNOSIS — E039 Hypothyroidism, unspecified: Secondary | ICD-10-CM

## 2018-05-17 MED ORDER — LAMOTRIGINE 150 MG PO TABS: 150.0000 mg | ORAL_TABLET | Freq: Two times a day (BID) | ORAL | 0 refills | Status: DC

## 2018-05-17 MED ORDER — CLONAZEPAM 0.5 MG PO TABS: 0.5000 mg | ORAL_TABLET | Freq: Every day | ORAL | 0 refills | Status: DC | PRN

## 2018-05-17 MED ORDER — SERTRALINE HCL 100 MG PO TABS: 150.0000 mg | ORAL_TABLET | Freq: Every day | ORAL | 0 refills | Status: DC

## 2018-05-22 ENCOUNTER — Telehealth: Payer: Self-pay | Admitting: Family

## 2018-05-22 DIAGNOSIS — L304 Erythema intertrigo: Secondary | ICD-10-CM

## 2018-05-22 MED ORDER — FLUCONAZOLE 150 MG PO TABS: 150.0000 mg | ORAL_TABLET | Freq: Once | ORAL | 0 refills | Status: AC

## 2018-05-22 MED ORDER — NYSTATIN 100000 UNIT/GM EX CREA: 1.0000 "application " | TOPICAL_CREAM | Freq: Two times a day (BID) | CUTANEOUS | 2 refills | Status: DC

## 2018-05-22 NOTE — Progress Notes (Signed)
Thank you for the details you included in the comment boxes. Those details are very helpful in determining the best course of treatment for you and help [place] to provide the best care.Sent a refill plus oral Diflucan.  E Visit for Rash  We are sorry that you are not feeling well. Here is how we plan to help!    HOME CARE:   Take cool showers and avoid direct sunlight.  Apply cool compress or wet dressings.  Take a bath in an oatmeal bath.  Sprinkle content of one Aveeno packet under running faucet with comfortably warm water.  Bathe for 15-20 minutes, 1-2 times daily.  Pat dry with a towel. Do not rub the rash.  Use hydrocortisone cream.  Take an antihistamine like Benadryl for widespread rashes that itch.  The adult dose of Benadryl is 25-50 mg by mouth 4 times daily.  Caution:  This type of medication may cause sleepiness.  Do not drink alcohol, drive, or operate dangerous machinery while taking antihistamines.  Do not take these medications if you have prostate enlargement.  Read package instructions thoroughly on all medications that you take.  GET HELP RIGHT AWAY IF:   Symptoms don't go away after treatment.  Severe itching that persists.  If you rash spreads or swells.  If you rash begins to smell.  If it blisters and opens or develops a yellow-brown crust.  You develop a fever.  You have a sore throat.  You become short of breath.  MAKE SURE YOU:  Understand these instructions. Will watch your condition. Will get help right away if you are not doing well or get worse.  Thank you for choosing an e-visit. Your e-visit answers were reviewed by a board certified advanced clinical practitioner to complete your personal care plan. Depending upon the condition, your plan could have included both over the counter or prescription medications. Please review your pharmacy choice. Be sure that the pharmacy you have chosen is open so that you can pick up your prescription  now.  If there is a problem you may message your provider in [place] to have the prescription routed to another pharmacy. Your safety is important to [place]. If you have drug allergies check your prescription carefully.  For the next 24 hours, you can use MyChart to ask questions about today's visit, request a non-urgent call back, or ask for a work or school excuse from your e-visit provider. You will get an email in the next two days asking about your experience. I hope that your e-visit has been valuable and will speed your recovery.

## 2018-06-01 ENCOUNTER — Other Ambulatory Visit: Payer: Self-pay | Admitting: Family Medicine

## 2018-06-01 DIAGNOSIS — E039 Hypothyroidism, unspecified: Secondary | ICD-10-CM

## 2018-06-05 ENCOUNTER — Telehealth: Payer: Self-pay

## 2018-06-05 NOTE — Telephone Encounter (Signed)
Pt states pharmacy has told her to call in and check on the refill of Synthroid. Pt states that she also needed: ONETOUCH VERIO test strip simvastatin (ZOCOR) 20 MG tablet

## 2018-06-05 NOTE — Telephone Encounter (Signed)
Patient would like these to be sent to  [facility], [place] - [address]  [address] [address]

## 2018-06-05 NOTE — Telephone Encounter (Signed)
Pt. Requesting refill of One Touch Verio test strips - historical provider. Also refill Zocor which has expired. Please send refills to [facility].

## 2018-06-08 MED ORDER — ONETOUCH VERIO VI STRP: ORAL_STRIP | 1 refills | Status: DC

## 2018-06-08 MED ORDER — SIMVASTATIN 20 MG PO TABS: 20.0000 mg | ORAL_TABLET | Freq: Every day | ORAL | 2 refills | Status: DC

## 2018-06-08 NOTE — Telephone Encounter (Addendum)
Pt has appt scheduled for 08/11/18 with PCP and should keep that appt for further refills. Refills sent today. Left detailed message on pt's voicemail of importance of keeping 07/2018 appt for further refills. Pt's last OV with PCP was 04/17/17.

## 2018-06-08 NOTE — Addendum Note (Signed)
Addended by: Kelle Darting A on: 06/08/2018 11:55 AM   Modules accepted: Orders

## 2018-06-16 ENCOUNTER — Other Ambulatory Visit (HOSPITAL_COMMUNITY): Payer: Self-pay

## 2018-06-16 DIAGNOSIS — F3181 Bipolar II disorder: Secondary | ICD-10-CM

## 2018-06-16 MED ORDER — SERTRALINE HCL 100 MG PO TABS: 150.0000 mg | ORAL_TABLET | Freq: Every day | ORAL | 0 refills | Status: DC

## 2018-06-16 MED ORDER — LAMOTRIGINE 150 MG PO TABS: 150.0000 mg | ORAL_TABLET | Freq: Two times a day (BID) | ORAL | 0 refills | Status: DC

## 2018-07-17 ENCOUNTER — Encounter (HOSPITAL_COMMUNITY): Payer: Self-pay | Admitting: Psychiatry

## 2018-07-17 ENCOUNTER — Ambulatory Visit (HOSPITAL_COMMUNITY): Payer: 59 | Admitting: Psychiatry

## 2018-07-17 VITALS — BP 113/70 | HR 70 | Ht 66.0 in | Wt 222.0 lb

## 2018-07-17 DIAGNOSIS — F411 Generalized anxiety disorder: Secondary | ICD-10-CM

## 2018-07-17 DIAGNOSIS — F1290 Cannabis use, unspecified, uncomplicated: Secondary | ICD-10-CM

## 2018-07-17 DIAGNOSIS — F3181 Bipolar II disorder: Secondary | ICD-10-CM

## 2018-07-17 MED ORDER — LAMOTRIGINE 150 MG PO TABS: 150.0000 mg | ORAL_TABLET | Freq: Two times a day (BID) | ORAL | 2 refills | Status: DC

## 2018-07-17 MED ORDER — SERTRALINE HCL 100 MG PO TABS: 150.0000 mg | ORAL_TABLET | Freq: Every day | ORAL | 2 refills | Status: DC

## 2018-07-17 NOTE — Progress Notes (Signed)
[facility] MD/PA/NP OP Progress Note  07/17/2018 8:22 AM Cheryl [name]  MRN:  [id number]  Chief Complaint: I have a lot of pain in my left leg.  I think I have sciatica.  I am using cannabis it is helping my pain.    HPI: Cheryl [name] came for her follow-up appointment.  She is frustrated because of leg pain which started few weeks ago.  She believes it is a sciatica and 1 of her friend gave her cannabis brownies and that is helping her pain.  She describes her Christmas was good.  Otherwise her mood is a stable.  She admitted sometimes poor sleep due to pain.  She stopped looking for a job because she realized that her drug test shows cannabis.  She is hoping that will be for a time being once her pain get under control.  We talked about risk of using drugs causing interaction with psychotropic medication.  We discussed that she need to have thorough evaluation to find out the cause of leg pain.  I encouraged her to see her physician to have a MRI.  She promised that she will contact the primary care physician for further work-up.  Overall her marriage life is going well.  She has no issues.  She denies any mania, psychosis, hallucination.  She denies any crying spells or any feeling of hopelessness or worthlessness.  Since we increase the Zoloft 250 mg her anxiety is much better.  She has no rash, itching or tremors.  She is not interested in therapy.  Visit Diagnosis:    ICD-10-CM   1. Generalized anxiety disorder F41.1 sertraline (ZOLOFT) 100 MG tablet  2. Bipolar 2 disorder, major depressive episode (HCC) F31.81 lamoTRIgine (LAMICTAL) 150 MG tablet    sertraline (ZOLOFT) 100 MG tablet    DISCONTINUED: sertraline (ZOLOFT) 100 MG tablet  3. Cannabis use, uncomplicated A12.87     Past Psychiatric History: Reviewed. H/O depression since college at [employer] state. Tried Prozac, wellbutrin and Effexor. Did not like Prozac and withdrawal from Effexor.  History of mania, anger and severe mood swings.  No  history of psychiatric inpatient treatment, suicidal attempt.  [facility] at [facility] tried psychiatry but not happy with the services.  Past Medical History:  Past Medical History:  Diagnosis Date  . Anxiety   . Bipolar 1 disorder ([place])   . Bipolar 2 disorder ([place])   . Depression   . Diabetes mellitus without complication ([place])   . Kidney stone   . Migraines    with aura  . OSA (obstructive sleep apnea) 10/17/2013  . Thyroid disease     Past Surgical History:  Procedure Laterality Date  . INTRAUTERINE DEVICE (IUD) INSERTION  10/2011   inserted & removed 2018  . NASAL SEPTUM SURGERY    . OTHER SURGICAL HISTORY     endometrial polyps (2)  . WISDOM TOOTH EXTRACTION      Family Psychiatric History: Reviewed.  Family History:  Family History  Problem Relation Age of Onset  . Cancer Mother 63       ovarian  . Hypothyroidism Mother   . Hypertension Father   . Diabetes Father   . Hyperlipidemia Father   . Heart disease Father        cabg--stint  . Migraines Father   . Hypothyroidism Sister   . Depression Sister   . Cancer Maternal Aunt        breast   . Cancer Maternal Uncle  throat  . COPD Maternal Grandmother   . Cancer Maternal Grandmother        lung  . Hypertension Maternal Grandmother   . Depression Maternal Grandmother   . Stroke Maternal Grandmother   . Cancer Maternal Grandfather        prostate, lung  . Diabetes Paternal Grandmother   . Stroke Paternal Grandmother   . Alcohol abuse Paternal Grandfather   . Aneurysm Paternal Grandfather   . Depression Cousin        bipolar    Social History:  Social History   Socioeconomic History  . Marital status: Married    Spouse name: Not on file  . Number of children: Not on file  . Years of education: Not on file  . Highest education level: Not on file  Occupational History  . Occupation: Designer, multimedia: [employer]  Social Needs  . Financial resource strain:  Not on file  . Food insecurity:    Worry: Not on file    Inability: Not on file  . Transportation needs:    Medical: Not on file    Non-medical: Not on file  Tobacco Use  . Smoking status: Current Every Day Smoker    Packs/day: 1.00    Years: 20.00    Pack years: 20.00    Types: Cigarettes  . Smokeless tobacco: Never Used  Substance and Sexual Activity  . Alcohol use: No    Alcohol/week: 0.0 standard drinks  . Drug use: No  . Sexual activity: Yes    Partners: Male    Birth control/protection: Pill  Lifestyle  . Physical activity:    Days per week: Not on file    Minutes per session: Not on file  . Stress: Not on file  Relationships  . Social connections:    Talks on phone: Not on file    Gets together: Not on file    Attends religious service: Not on file    Active member of club or organization: Not on file    Attends meetings of clubs or organizations: Not on file    Relationship status: Not on file  Other Topics Concern  . Not on file  Social History Narrative   Exercise-- no    Allergies:  Allergies  Allergen Reactions  . Food Itching    Bison-throat tingling  . Augmentin [Amoxicillin-Pot Clavulanate] Diarrhea    Metabolic Disorder Labs: Lab Results  Component Value Date   HGBA1C 5.8 01/07/2017   No results found for: PROLACTIN Lab Results  Component Value Date   CHOL 184 01/07/2017   TRIG 155.0 (H) 01/07/2017   HDL 38.50 (L) 01/07/2017   CHOLHDL 5 01/07/2017   VLDL 31.0 01/07/2017   LDLCALC 115 (H) 01/07/2017   LDLCALC 52 04/22/2016   Lab Results  Component Value Date   TSH 3.54 01/07/2017   TSH 11.31 (H) 04/22/2016    Therapeutic Level Labs: No results found for: LITHIUM No results found for: VALPROATE No components found for:  CBMZ  Current Medications: Current Outpatient Medications  Medication Sig Dispense Refill  . clonazePAM (KLONOPIN) 0.5 MG tablet Take 1 tablet (0.5 mg total) by mouth daily as needed for anxiety. 30 tablet 0   . ibuprofen (ADVIL,MOTRIN) 200 MG tablet Take 400 mg by mouth every 6 (six) hours as needed.    . lamoTRIgine (LAMICTAL) 150 MG tablet Take 1 tablet (150 mg total) by mouth 2 (two) times daily. Take 1 tablet in  the AM, 1 tablet in the PM 60 tablet 0  . levothyroxine (SYNTHROID, LEVOTHROID) 150 MCG tablet TAKE 1 TABLET(150 MCG) BY MOUTH DAILY 90 tablet 0  . naproxen sodium (ANAPROX) 220 MG tablet Take 220 mg by mouth 2 (two) times daily as needed (pain).    . norethindrone (NORLYDA) 0.35 MG tablet Take 1 tablet (0.35 mg total) by mouth daily. 3 Package 4  . nystatin cream (MYCOSTATIN) Apply 1 application topically 2 (two) times daily. 30 g 2  . ONETOUCH DELICA LANCETS FINE MISC As directed qd 100 each 1  . ONETOUCH VERIO test strip Use to check blood sugar once a day. DX E11.51, E11.65 100 each 1  . sertraline (ZOLOFT) 100 MG tablet Take 1.5 tablets (150 mg total) by mouth daily. 45 tablet 0  . simvastatin (ZOCOR) 20 MG tablet Take 1 tablet (20 mg total) by mouth at bedtime. 30 tablet 2   No current facility-administered medications for this visit.      Musculoskeletal: Strength & Muscle Tone: within normal limits Gait & Station: normal Patient leans: N/A  Psychiatric Specialty Exam: Review of Systems  Musculoskeletal: Positive for joint pain.       Leg pain that radiates.  Skin: Negative for itching and rash.  Psychiatric/Behavioral: Positive for substance abuse.    Blood pressure 113/70, pulse 70, height 5\' 6"  (1.676 m), weight 222 lb (100.7 kg), SpO2 96 %.There is no height or weight on file to calculate BMI.  General Appearance: Casual  Eye Contact:  Good  Speech:  Clear and Coherent  Volume:  Normal  Mood:  Euthymic  Affect:  Congruent  Thought Process:  Descriptions of Associations: Intact  Orientation:  Full (Time, Place, and Person)  Thought Content: Logical   Suicidal Thoughts:  No  Homicidal Thoughts:  No  Memory:  Immediate;   Good Recent;   Good Remote;   Good   Judgement:  Fair  Insight:  Fair  Psychomotor Activity:  Normal  Concentration:  Concentration: Fair and Attention Span: Fair  Recall:  Good  Fund of Knowledge: Good  Language: Good  Akathisia:  No  Handed:  Right  AIMS (if indicated): not done  Assets:  Communication Skills Des[employer] for [place] Talents/Skills Transportation  ADL's:  Intact  Cognition: WNL  Sleep:  Fair   Screenings: PHQ2-9     Office Visit from 09/28/2013 in [place] at [facility] Total Score  0       Assessment and Plan: Bipolar disorder type II.  Generalized anxiety disorder.  Cannabis use  Discussed use of cannabis along with psychotropic medication especially benzodiazepine.  We will discontinue Klonopin 0.5 mg for now as patient is using cannabis.  She usually takes Klonopin for severe anxiety and has not taken in a while.  I encouraged to contact her physician to rule out because of leg pain.  She is feeling better with increase Zoloft and she has no side effects.  Recommended to continue Lamictal 150 mg twice a day and Zoloft 150 mg daily.  Recommended to call us back if she has any question or any concern.  She is not interested in therapy.  I will see her again in 3 months.   Kathlee Nations, MD 07/17/2018, 8:22 AM

## 2018-08-11 ENCOUNTER — Encounter: Payer: Self-pay | Admitting: Family Medicine

## 2018-08-11 ENCOUNTER — Ambulatory Visit (INDEPENDENT_AMBULATORY_CARE_PROVIDER_SITE_OTHER): Payer: 59 | Admitting: Family Medicine

## 2018-08-11 VITALS — BP 126/82 | HR 94 | Resp 16 | Ht 65.5 in | Wt 223.0 lb

## 2018-08-11 DIAGNOSIS — Z0000 Encounter for general adult medical examination without abnormal findings: Secondary | ICD-10-CM

## 2018-08-11 DIAGNOSIS — E1169 Type 2 diabetes mellitus with other specified complication: Secondary | ICD-10-CM

## 2018-08-11 DIAGNOSIS — D229 Melanocytic nevi, unspecified: Secondary | ICD-10-CM | POA: Diagnosis not present

## 2018-08-11 DIAGNOSIS — E039 Hypothyroidism, unspecified: Secondary | ICD-10-CM

## 2018-08-11 DIAGNOSIS — G43709 Chronic migraine without aura, not intractable, without status migrainosus: Secondary | ICD-10-CM

## 2018-08-11 DIAGNOSIS — E1165 Type 2 diabetes mellitus with hyperglycemia: Secondary | ICD-10-CM

## 2018-08-11 DIAGNOSIS — E785 Hyperlipidemia, unspecified: Secondary | ICD-10-CM

## 2018-08-11 MED ORDER — RIZATRIPTAN BENZOATE 10 MG PO TBDP: 10.0000 mg | ORAL_TABLET | ORAL | 0 refills | Status: DC | PRN

## 2018-08-11 NOTE — Progress Notes (Signed)
Objective:    Assessment & Plan:            Subjective:     Cheryl [name] is a 43 y.o. female and is here for a comprehensive physical exam. The patient reports problems - pt c/o low back pain that radiates down R leg --she has a hx of back pain from a fall years ago but recently worse .  She also c/o rash on her arm that was tx with antifungal but it did not completely clear up-- she still has some on her L forearm-- itchy She also c/o mole L low leg that is itchy She also c/o migraines --worsening in intensity and frequency-- she is requesting maxalt again --- they are her typical migraine--- sometimes she has an aura and sometimes not   Social History   Socioeconomic History  . Marital status: Married    Spouse name: Not on file  . Number of children: Not on file  . Years of education: Not on file  . Highest education level: Not on file  Occupational History  . Occupation: Designer, multimedia: [employer]  Social Needs  . Financial resource strain: Not on file  . Food insecurity:    Worry: Not on file    Inability: Not on file  . Transportation needs:    Medical: Not on file    Non-medical: Not on file  Tobacco Use  . Smoking status: Current Every Day Smoker    Packs/day: 1.00    Years: 20.00    Pack years: 20.00    Types: Cigarettes  . Smokeless tobacco: Never Used  Substance and Sexual Activity  . Alcohol use: No    Alcohol/week: 0.0 standard drinks  . Drug use: No  . Sexual activity: Yes    Partners: Male    Birth control/protection: Pill  Lifestyle  . Physical activity:    Days per week: Not on file    Minutes per session: Not on file  . Stress: Not on file  Relationships  . Social connections:    Talks on phone: Not on file    Gets together: Not on file    Attends religious service: Not on file    Active member of club or organization: Not on file    Attends meetings of clubs or organizations:  Not on file    Relationship status: Not on file  . Intimate partner violence:    Fear of current or ex partner: Not on file    Emotionally abused: Not on file    Physically abused: Not on file    Forced sexual activity: Not on file  Other Topics Concern  . Not on file  Social History Narrative   Exercise-- no   Health Maintenance  Topic Date Due  . OPHTHALMOLOGY EXAM  12/03/1985  . URINE MICROALBUMIN  07/31/2016  . HEMOGLOBIN A1C  07/10/2017  . HIV Screening  01/08/2028 (Originally 12/04/1990)  . FOOT EXAM  08/12/2019  . PAP SMEAR-Modifier  10/12/2020  . TETANUS/TDAP  08/04/2026  . INFLUENZA VACCINE  Completed  . PNEUMOCOCCAL POLYSACCHARIDE VACCINE AGE 54-64 HIGH RISK  Completed    The following portions of the patient's history were reviewed and updated as appropriate: allergies, current medications, past family history, past medical history, past social history, past surgical history and problem list.  Review of Systems Review of Systems  Constitutional: Negative for chills, diaphoresis, fever, malaise/fatigue and  weight loss.  HENT: Negative.   Eyes: Negative.  Negative for blurred vision, double vision, photophobia, pain, discharge and redness.  Respiratory: Negative.  Negative for cough, sputum production, shortness of breath and wheezing.   Cardiovascular: Negative.  Negative for chest pain, palpitations, orthopnea, claudication, leg swelling and PND.  Gastrointestinal: Negative for abdominal pain, blood in stool, constipation, diarrhea, heartburn, melena, nausea and vomiting.  Genitourinary: Negative for dysuria, flank pain, frequency, hematuria and urgency.  Musculoskeletal: Positive for back pain. Negative for falls, joint pain, myalgias and neck pain.  Skin: Positive for itching and rash.  Neurological: Positive for headaches. Negative for dizziness, tingling, tremors, sensory change, speech change, focal weakness, seizures, loss of consciousness and weakness.   Endo/Heme/Allergies: Negative for environmental allergies and polydipsia. Does not bruise/bleed easily.  Psychiatric/Behavioral: Negative for depression, hallucinations, memory loss, substance abuse and suicidal ideas. The patient is not nervous/anxious and does not have insomnia.      Objective:    BP 126/82 (BP Location: Left Arm, Patient Position: Sitting, Cuff Size: Normal)   Pulse 94   Resp 16   Ht 5' 5.5" (1.664 m)   Wt 223 lb (101.2 kg)   SpO2 94%   BMI 36.54 kg/m  General appearance: alert, cooperative, appears stated age and no distress Head: Normocephalic, without obvious abnormality, atraumatic Eyes: conjunctivae/corneas clear. PERRL, EOM's intact. Fundi benign. Ears: normal TM's and external ear canals both ears Nose: Nares normal. Septum midline. Mucosa normal. No drainage or sinus tenderness. Throat: lips, mucosa, and tongue normal; teeth and gums normal Neck: no adenopathy, no carotid bruit, no JVD, supple, symmetrical, trachea midline and thyroid not enlarged, symmetric, no tenderness/mass/nodules Back: symmetric, no curvature. ROM normal. No CVA tenderness. Lungs: clear to auscultation bilaterally Breasts: gyn Heart: regular rate and rhythm, S1, S2 normal, no murmur, click, rub or gallop Abdomen: soft, non-tender; bowel sounds normal; no masses,  no organomegaly Pelvic: deferred--gyn Extremities: extremities normal, atraumatic, no cyanosis or edema Pulses: 2+ and symmetric Skin: Skin color, texture, turgor normal. No rashes or lesions or ? sk on L low leg  Lymph nodes: Cervical, supraclavicular, and axillary nodes normal. Neurologic: Alert and oriented X 3, normal strength and tone. Normal symmetric reflexes. Normal coordination and gait    Assessment:    Healthy female exam.     Plan:    ghm utd Check labs  See After Visit Summary for Counseling Recommendations    1. Type 2 diabetes mellitus with hyperglycemia, without long-term current use of insulin  (HCC) Check labs hgba1c to be checked  minimize simple carbs. Increase exercise as tolerated. Continue current meds  - TSH; Future - Lipid panel; Future - Comprehensive metabolic panel; Future - Microalbumin / creatinine urine ratio - Hemoglobin A1c; Future  2. Hyperlipidemia associated with type 2 diabetes mellitus ([place]) Tolerating statin, encouraged heart healthy diet, avoid trans fats, minimize simple carbs and saturated fats. Increase exercise as tolerated - Lipid panel; Future - Comprehensive metabolic panel; Future  3. Hypothyroidism, unspecified type Check labs  Stable Con't meds  - TSH; Future  4. Preventative health care See above - CBC with Differential/Platelet; Future  5. Suspicious nevus  - Ambulatory referral to Dermatology  6. Chronic migraine without aura without status migrainosus, not intractable If no relief-- consider MRI brain - rizatriptan (MAXALT-MLT) 10 MG disintegrating tablet; Take 1 tablet (10 mg total) by mouth as needed for migraine. May repeat in 2 hours if needed  Dispense: 10 tablet; Refill: 0

## 2018-08-11 NOTE — Patient Instructions (Signed)
Carbohydrate Counting for Diabetes Mellitus, Adult  Carbohydrate counting is a method of keeping track of how many carbohydrates you eat. Eating carbohydrates naturally increases the amount of sugar (glucose) in the blood. Counting how many carbohydrates you eat helps keep your blood glucose within normal limits, which helps you manage your diabetes (diabetes mellitus). It is important to know how many carbohydrates you can safely have in each meal. This is different for every person. A diet and nutrition specialist (registered dietitian) can help you make a meal plan and calculate how many carbohydrates you should have at each meal and snack. Carbohydrates are found in the following foods:  Grains, such as breads and cereals.  Dried beans and soy products.  Starchy vegetables, such as potatoes, peas, and corn.  Fruit and fruit juices.  Milk and yogurt.  Sweets and snack foods, such as cake, cookies, candy, chips, and soft drinks. How do I count carbohydrates? There are two ways to count carbohydrates in food. You can use either of the methods or a combination of both. Reading "Nutrition Facts" on packaged food The "Nutrition Facts" list is included on the labels of almost all packaged foods and beverages in the [place] It includes:  The serving size.  Information about nutrients in each serving, including the grams (g) of carbohydrate per serving. To use the "Nutrition Facts":  Decide how many servings you will have.  Multiply the number of servings by the number of carbohydrates per serving.  The resulting number is the total amount of carbohydrates that you will be having. Learning standard serving sizes of other foods When you eat carbohydrate foods that are not packaged or do not include "Nutrition Facts" on the label, you need to measure the servings in order to count the amount of carbohydrates:  Measure the foods that you will eat with a food scale or measuring cup, if needed.   Decide how many standard-size servings you will eat.  Multiply the number of servings by 15. Most carbohydrate-rich foods have about 15 g of carbohydrates per serving. ? For example, if you eat 8 oz (170 g) of strawberries, you will have eaten 2 servings and 30 g of carbohydrates (2 servings x 15 g = 30 g).  For foods that have more than one food mixed, such as soups and casseroles, you must count the carbohydrates in each food that is included. The following list contains standard serving sizes of common carbohydrate-rich foods. Each of these servings has about 15 g of carbohydrates:   hamburger bun or  English muffin.   oz (15 mL) syrup.   oz (14 g) jelly.  1 slice of bread.  1 six-inch tortilla.  3 oz (85 g) cooked rice or pasta.  4 oz (113 g) cooked dried beans.  4 oz (113 g) starchy vegetable, such as peas, corn, or potatoes.  4 oz (113 g) hot cereal.  4 oz (113 g) mashed potatoes or  of a large baked potato.  4 oz (113 g) canned or frozen fruit.  4 oz (120 mL) fruit juice.  4-6 crackers.  6 chicken nuggets.  6 oz (170 g) unsweetened dry cereal.  6 oz (170 g) plain fat-free yogurt or yogurt sweetened with artificial sweeteners.  8 oz (240 mL) milk.  8 oz (170 g) fresh fruit or one small piece of fruit.  24 oz (680 g) popped popcorn. Example of carbohydrate counting Sample meal  3 oz (85 g) chicken breast.  6 oz (170 g)   brown rice.  4 oz (113 g) corn.  8 oz (240 mL) milk.  8 oz (170 g) strawberries with sugar-free whipped topping. Carbohydrate calculation 1. Identify the foods that contain carbohydrates: ? Rice. ? Corn. ? Milk. ? Strawberries. 2. Calculate how many servings you have of each food: ? 2 servings rice. ? 1 serving corn. ? 1 serving milk. ? 1 serving strawberries. 3. Multiply each number of servings by 15 g: ? 2 servings rice x 15 g = 30 g. ? 1 serving corn x 15 g = 15 g. ? 1 serving milk x 15 g = 15 g. ? 1 serving  strawberries x 15 g = 15 g. 4. Add together all of the amounts to find the total grams of carbohydrates eaten: ? 30 g + 15 g + 15 g + 15 g = 75 g of carbohydrates total. Summary  Carbohydrate counting is a method of keeping track of how many carbohydrates you eat.  Eating carbohydrates naturally increases the amount of sugar (glucose) in the blood.  Counting how many carbohydrates you eat helps keep your blood glucose within normal limits, which helps you manage your diabetes.  A diet and nutrition specialist (registered dietitian) can help you make a meal plan and calculate how many carbohydrates you should have at each meal and snack. This information is not intended to replace advice given to you by your health care provider. Make sure you discuss any questions you have with your health care provider. Document Released: 06/07/2005 Document Revised: 12/15/2016 Document Reviewed: 11/19/2015 Elsevier Interactive Patient Education  2019 Elsevier Inc.  

## 2018-08-14 ENCOUNTER — Other Ambulatory Visit (INDEPENDENT_AMBULATORY_CARE_PROVIDER_SITE_OTHER): Payer: 59

## 2018-08-14 DIAGNOSIS — Z0000 Encounter for general adult medical examination without abnormal findings: Secondary | ICD-10-CM

## 2018-08-14 DIAGNOSIS — E785 Hyperlipidemia, unspecified: Secondary | ICD-10-CM | POA: Diagnosis not present

## 2018-08-14 DIAGNOSIS — E039 Hypothyroidism, unspecified: Secondary | ICD-10-CM

## 2018-08-14 DIAGNOSIS — E1169 Type 2 diabetes mellitus with other specified complication: Secondary | ICD-10-CM | POA: Diagnosis not present

## 2018-08-14 DIAGNOSIS — E1165 Type 2 diabetes mellitus with hyperglycemia: Secondary | ICD-10-CM | POA: Diagnosis not present

## 2018-08-14 LAB — CBC WITH DIFFERENTIAL/PLATELET
BASOS PCT: 0.7 % (ref 0.0–3.0)
Basophils Absolute: 0.1 10*3/uL (ref 0.0–0.1)
EOS PCT: 1.3 % (ref 0.0–5.0)
Eosinophils Absolute: 0.1 10*3/uL (ref 0.0–0.7)
HCT: 43.3 % (ref 36.0–46.0)
Hemoglobin: 14.6 g/dL (ref 12.0–15.0)
Lymphocytes Relative: 30 % (ref 12.0–46.0)
Lymphs Abs: 2.6 10*3/uL (ref 0.7–4.0)
MCHC: 33.8 g/dL (ref 30.0–36.0)
MCV: 93.3 fl (ref 78.0–100.0)
MONOS PCT: 7.2 % (ref 3.0–12.0)
Monocytes Absolute: 0.6 10*3/uL (ref 0.1–1.0)
Neutro Abs: 5.3 10*3/uL (ref 1.4–7.7)
Neutrophils Relative %: 60.8 % (ref 43.0–77.0)
Platelets: 250 10*3/uL (ref 150.0–400.0)
RBC: 4.64 Mil/uL (ref 3.87–5.11)
RDW: 12.9 % (ref 11.5–15.5)
WBC: 8.8 10*3/uL (ref 4.0–10.5)

## 2018-08-14 LAB — COMPREHENSIVE METABOLIC PANEL
ALT: 42 U/L — ABNORMAL HIGH (ref 0–35)
AST: 26 U/L (ref 0–37)
Albumin: 4.4 g/dL (ref 3.5–5.2)
Alkaline Phosphatase: 66 U/L (ref 39–117)
BILIRUBIN TOTAL: 0.4 mg/dL (ref 0.2–1.2)
BUN: 16 mg/dL (ref 6–23)
CO2: 32 mEq/L (ref 19–32)
Calcium: 9.6 mg/dL (ref 8.4–10.5)
Chloride: 100 mEq/L (ref 96–112)
Creatinine, Ser: 0.84 mg/dL (ref 0.40–1.20)
GFR: 74.11 mL/min (ref >60.00)
Glucose, Bld: 141 mg/dL — ABNORMAL HIGH (ref 70–99)
Potassium: 4.2 mEq/L (ref 3.5–5.1)
Sodium: 140 mEq/L (ref 135–145)
Total Protein: 7 g/dL (ref 6.0–8.3)

## 2018-08-14 LAB — LDL CHOLESTEROL, DIRECT: Direct LDL: 148 mg/dL

## 2018-08-14 LAB — LIPID PANEL
Cholesterol: 208 mg/dL — ABNORMAL HIGH (ref 0–200)
HDL: 42.5 mg/dL (ref >39.00)
NONHDL: 165.93
Total CHOL/HDL Ratio: 5
Triglycerides: 287 mg/dL — ABNORMAL HIGH (ref 0.0–149.0)
VLDL: 57.4 mg/dL — ABNORMAL HIGH (ref 0.0–40.0)

## 2018-08-14 LAB — HEMOGLOBIN A1C: Hgb A1c MFr Bld: 6.9 % — ABNORMAL HIGH (ref 4.6–6.5)

## 2018-08-14 LAB — TSH: TSH: 3.61 u[IU]/mL (ref 0.35–4.50)

## 2018-08-16 ENCOUNTER — Other Ambulatory Visit: Payer: Self-pay | Admitting: Family Medicine

## 2018-08-16 DIAGNOSIS — E1169 Type 2 diabetes mellitus with other specified complication: Secondary | ICD-10-CM

## 2018-08-16 DIAGNOSIS — E785 Hyperlipidemia, unspecified: Principal | ICD-10-CM

## 2018-08-16 DIAGNOSIS — E1165 Type 2 diabetes mellitus with hyperglycemia: Secondary | ICD-10-CM

## 2018-08-16 MED ORDER — METFORMIN HCL 500 MG PO TABS: 500.0000 mg | ORAL_TABLET | Freq: Two times a day (BID) | ORAL | 3 refills | Status: DC

## 2018-08-16 MED ORDER — ROSUVASTATIN CALCIUM 5 MG PO TABS: 5.0000 mg | ORAL_TABLET | Freq: Every day | ORAL | 3 refills | Status: DC

## 2018-08-28 ENCOUNTER — Ambulatory Visit (HOSPITAL_BASED_OUTPATIENT_CLINIC_OR_DEPARTMENT_OTHER)
Admission: RE | Admit: 2018-08-28 | Discharge: 2018-08-28 | Disposition: A | Discharge status: Home / Self Care | Payer: 59 | Source: Ambulatory Visit | Attending: Medical | Admitting: Medical

## 2018-08-28 ENCOUNTER — Ambulatory Visit: Payer: 59 | Admitting: Medical

## 2018-08-28 ENCOUNTER — Encounter: Payer: Self-pay | Admitting: Medical

## 2018-08-28 ENCOUNTER — Other Ambulatory Visit: Payer: Self-pay

## 2018-08-28 VITALS — BP 104/60 | HR 82 | Temp 98.1°F | Resp 18 | Ht 65.5 in | Wt 221.0 lb

## 2018-08-28 DIAGNOSIS — M25561 Pain in right knee: Secondary | ICD-10-CM

## 2018-08-28 DIAGNOSIS — M25562 Pain in left knee: Secondary | ICD-10-CM

## 2018-08-28 IMAGING — DX RIGHT KNEE - 3 VIEW
3 series · 3 of 3 positions shown · non-contrast
Comparison: None.

CLINICAL DATA: Pain following fall

EXAM:
RIGHT KNEE - 3 VIEW

[knee ap]
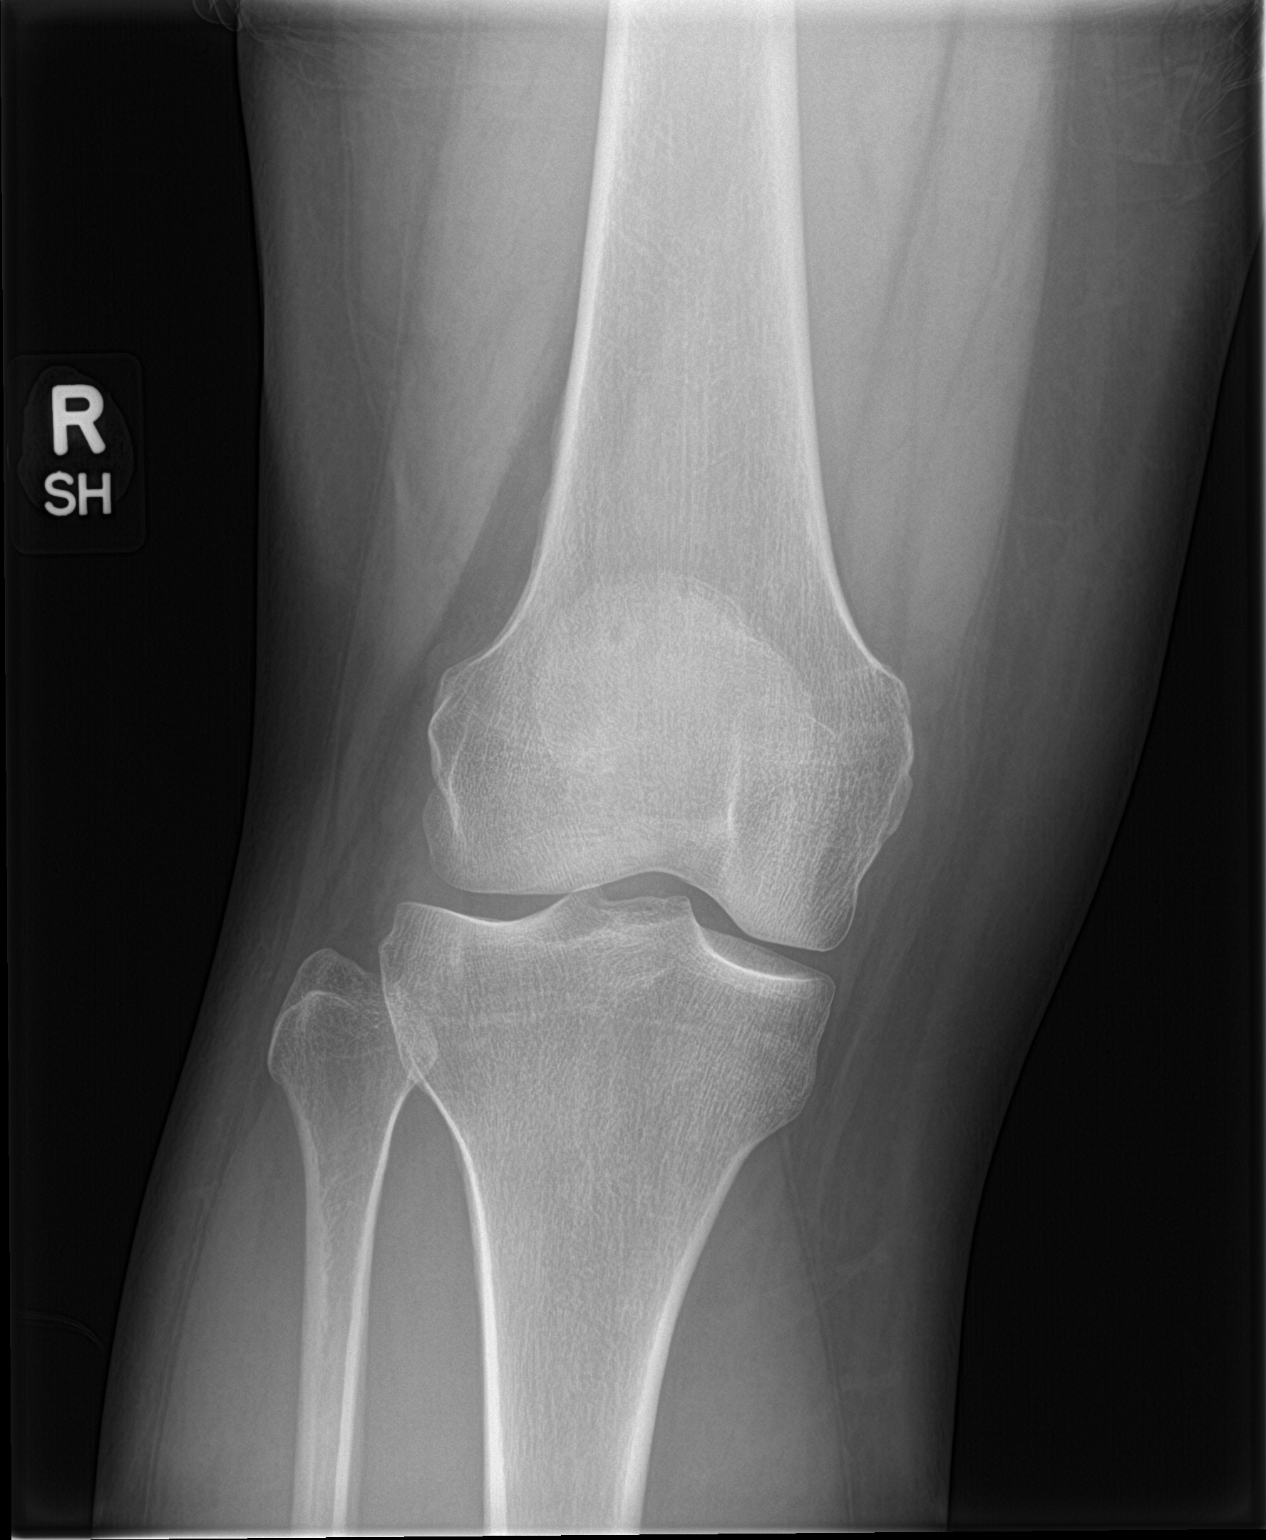

[knee lat]
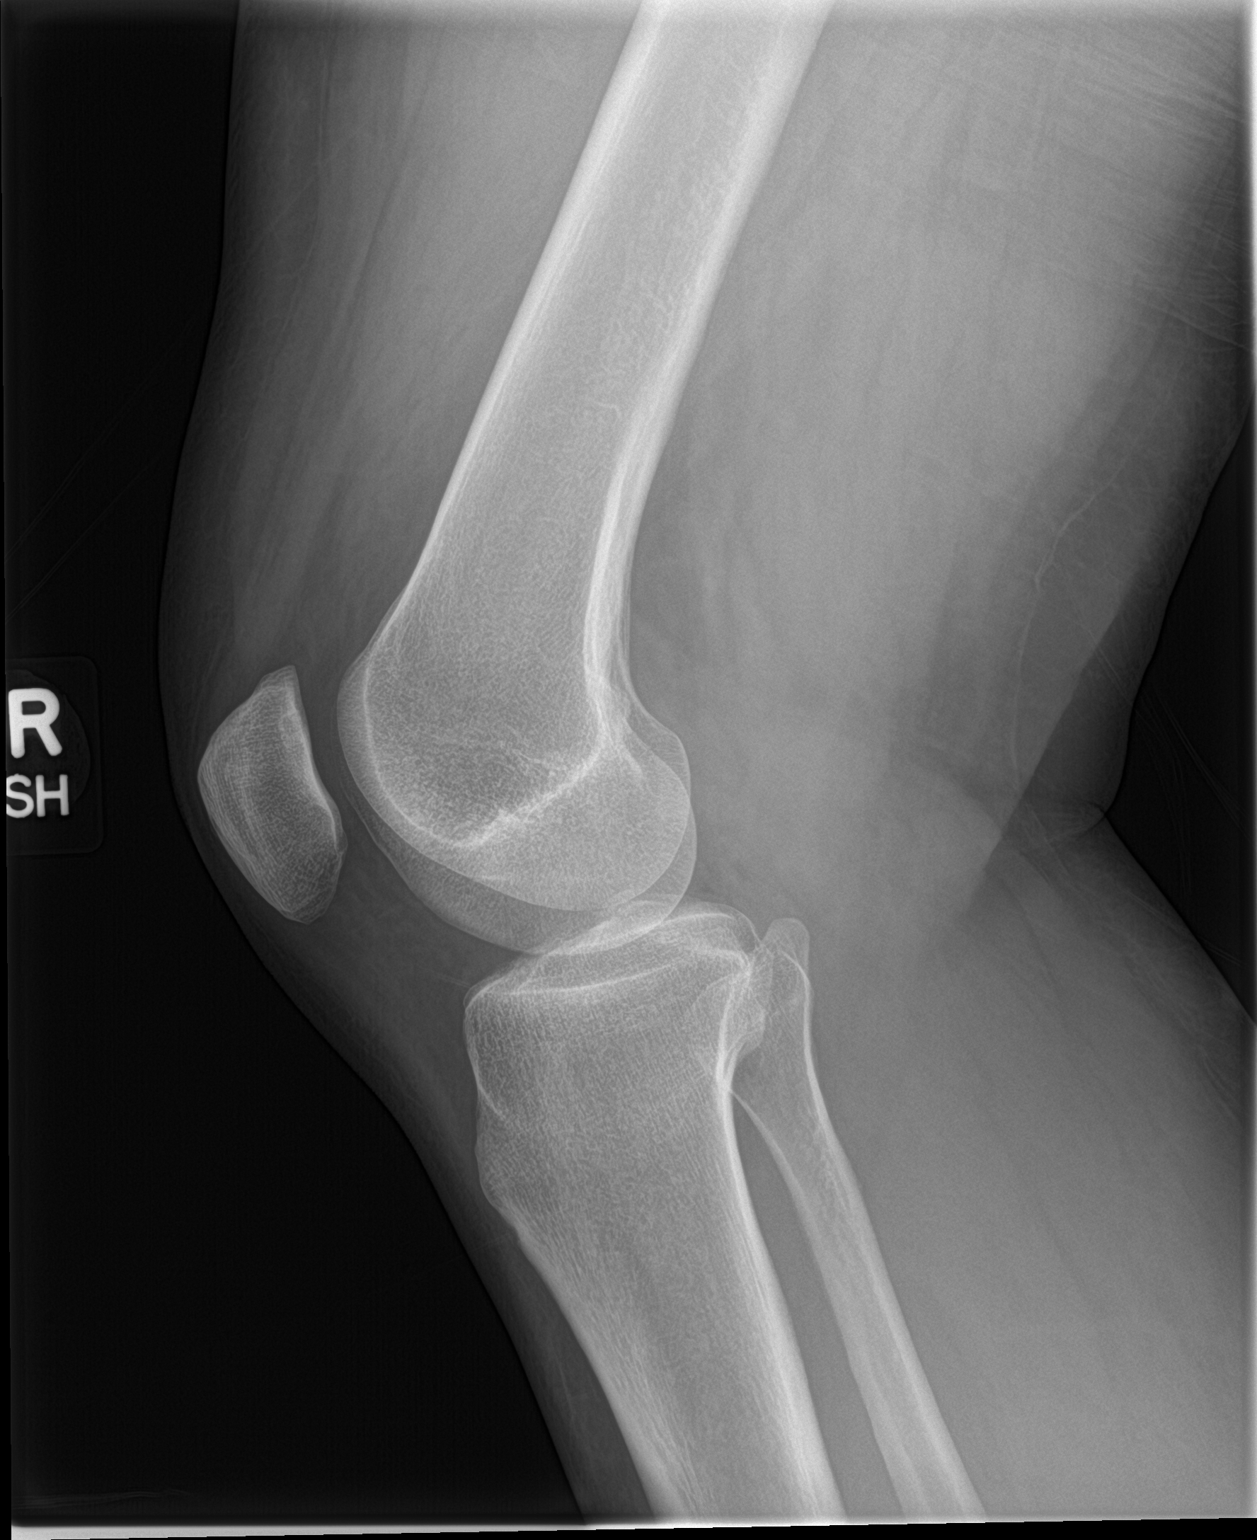

[knee sunrise]
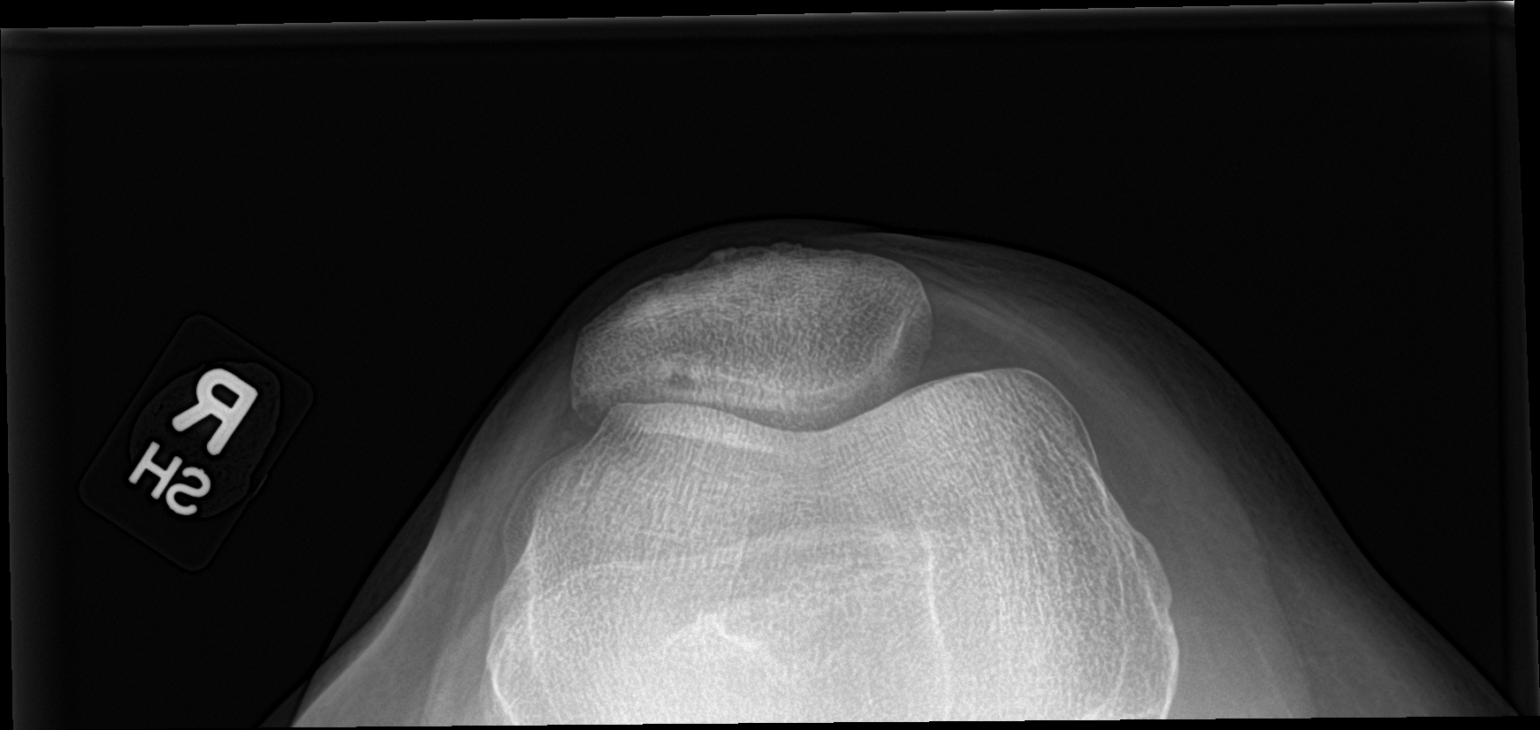

[3 of 3 positions shown; findings below may reference images not displayed]

FINDINGS: Frontal, lateral, and sunrise patellar images were obtained. No
fracture or dislocation. No appreciable joint effusion. Joint spaces
appear unremarkable. No erosive change.
IMPRESSION: No fracture or dislocation. No joint effusion. No appreciable
arthropathy.

## 2018-08-28 NOTE — Patient Instructions (Addendum)
Your recent fall with injury to both knees.  Left knee exam is normal and you describe only minimal soreness I do not think x-rays indicated for the left knee.  Have your right knee pain is intermittent with moderate to high level pain.  You do have some pain on flexion extension as well.  Would recommend getting right knee x-ray today.  Recommend that you get over-the-counter knee brace and use daily.  Could continue to use Aleve 1 to 2 tablets every 12 hours as needed.  If you wanted to you could supplement Tylenol along with this to help with pain.  I will update you on the knee x-ray results by my chart.  If you would give me an update on how your knee is doing by late Wednesday afternoon/evening.  If your pain is not much improved with intermittent high-level periods of pain then would likely refer you to sports medicine for evaluation of possible internal derangement such as meniscus injury.  Follow-up date to be determined depending on how you are doing when you give me my chart update.

## 2018-08-28 NOTE — Progress Notes (Signed)
Subjective:    Patient ID: Cheryl [name], female    DOB: 1976-03-25, 43 y.o.   MRN: [id number]  HPI  Pt states she was holding her 90 lb dog on leash. She states dog shifted quickly. She lost her balance and landed on concrete. Left knee is sore. But now has sharp occasional pain in her rt knee. Twinge pain now that occurs in rt knee.Pain level 6-8/10.  Pt has been using alleve 1-2 tab every 12 hours.    Review of Systems  Constitutional: Negative for chills, fatigue and fever.  Respiratory: Negative for cough, chest tightness, shortness of breath and wheezing.   Cardiovascular: Negative for chest pain and palpitations.  Gastrointestinal: Negative for abdominal pain.  Musculoskeletal:       See hpi.   Skin: Negative for rash.  Hematological: Negative for adenopathy. Does not bruise/bleed easily.  Psychiatric/Behavioral: Negative for agitation, behavioral problems and confusion. The patient is not nervous/anxious.     Past Medical History:  Diagnosis Date  . Anxiety   . Bipolar 1 disorder ([place])   . Bipolar 2 disorder ([place])   . Depression   . Diabetes mellitus without complication ([place])   . Kidney stone   . Migraines    with aura  . OSA (obstructive sleep apnea) 10/17/2013  . Thyroid disease      Social History   Socioeconomic History  . Marital status: Married    Spouse name: Not on file  . Number of children: Not on file  . Years of education: Not on file  . Highest education level: Not on file  Occupational History  . Occupation: Designer, multimedia: [employer]  Social Needs  . Financial resource strain: Not on file  . Food insecurity:    Worry: Not on file    Inability: Not on file  . Transportation needs:    Medical: Not on file    Non-medical: Not on file  Tobacco Use  . Smoking status: Current Every Day Smoker    Packs/day: 1.00    Years: 20.00    Pack years: 20.00    Types: Cigarettes  . Smokeless tobacco:  Never Used  Substance and Sexual Activity  . Alcohol use: No    Alcohol/week: 0.0 standard drinks  . Drug use: No  . Sexual activity: Yes    Partners: Male    Birth control/protection: Pill  Lifestyle  . Physical activity:    Days per week: Not on file    Minutes per session: Not on file  . Stress: Not on file  Relationships  . Social connections:    Talks on phone: Not on file    Gets together: Not on file    Attends religious service: Not on file    Active member of club or organization: Not on file    Attends meetings of clubs or organizations: Not on file    Relationship status: Not on file  . Intimate partner violence:    Fear of current or ex partner: Not on file    Emotionally abused: Not on file    Physically abused: Not on file    Forced sexual activity: Not on file  Other Topics Concern  . Not on file  Social History Narrative   Exercise-- no    Past Surgical History:  Procedure Laterality Date  . INTRAUTERINE DEVICE (IUD) INSERTION  10/2011   inserted & removed 2018  . NASAL SEPTUM SURGERY    .  OTHER SURGICAL HISTORY     endometrial polyps (2)  . WISDOM TOOTH EXTRACTION      Family History  Problem Relation Age of Onset  . Cancer Mother 64       ovarian  . Hypothyroidism Mother   . Stroke Mother 99  . Hypertension Father   . Diabetes Father   . Hyperlipidemia Father   . Heart disease Father        cabg--stint  . Migraines Father   . Stroke Father   . Hypothyroidism Sister   . Depression Sister   . Cancer Maternal Aunt        breast   . Cancer Maternal Uncle        throat  . COPD Maternal Grandmother   . Cancer Maternal Grandmother        lung  . Hypertension Maternal Grandmother   . Depression Maternal Grandmother   . Stroke Maternal Grandmother   . Cancer Maternal Grandfather        prostate, lung  . Diabetes Paternal Grandmother   . Stroke Paternal Grandmother   . Alcohol abuse Paternal Grandfather   . Aneurysm Paternal Grandfather    . Depression Cousin        bipolar    Allergies  Allergen Reactions  . Food Itching    Bison-throat tingling  . Augmentin [Amoxicillin-Pot Clavulanate] Diarrhea    Current Outpatient Medications on File Prior to Visit  Medication Sig Dispense Refill  . ibuprofen (ADVIL,MOTRIN) 200 MG tablet Take 400 mg by mouth every 6 (six) hours as needed.    . lamoTRIgine (LAMICTAL) 150 MG tablet Take 1 tablet (150 mg total) by mouth 2 (two) times daily. 60 tablet 2  . levothyroxine (SYNTHROID, LEVOTHROID) 150 MCG tablet TAKE 1 TABLET(150 MCG) BY MOUTH DAILY 90 tablet 0  . metFORMIN (GLUCOPHAGE) 500 MG tablet Take 1 tablet (500 mg total) by mouth 2 (two) times daily with a meal. 180 tablet 3  . naproxen sodium (ANAPROX) 220 MG tablet Take 220 mg by mouth 2 (two) times daily as needed (pain).    . norethindrone (NORLYDA) 0.35 MG tablet Take 1 tablet (0.35 mg total) by mouth daily. 3 Package 4  . ONETOUCH DELICA LANCETS FINE MISC As directed qd 100 each 1  . ONETOUCH VERIO test strip Use to check blood sugar once a day. DX E11.51, E11.65 100 each 1  . rizatriptan (MAXALT-MLT) 10 MG disintegrating tablet Take 1 tablet (10 mg total) by mouth as needed for migraine. May repeat in 2 hours if needed 10 tablet 0  . rosuvastatin (CRESTOR) 5 MG tablet Take 1 tablet (5 mg total) by mouth daily. 90 tablet 3  . sertraline (ZOLOFT) 100 MG tablet Take 1.5 tablets (150 mg total) by mouth daily. 45 tablet 2   No current facility-administered medications on file prior to visit.     BP 104/60 (BP Location: Right Arm, Patient Position: Sitting, Cuff Size: Large)   Pulse 82   Temp 98.1 F (36.7 C) (Oral)   Resp 18   Ht 5' 5.5" (1.664 m)   Wt 221 lb (100.2 kg)   SpO2 97%   BMI 36.22 kg/m       Objective:   Physical Exam  General- No acute distress. Pleasant patient. Neck- Full range of motion, no jvd Lungs- Clear, even and unlabored. Heart- regular rate and rhythm. Neurologic- CNII- XII grossly  intact.  Left knee- good range of motion. No crepitus. No range of motion.  Rt knee- good range of motion. No crepitus. No range of motion. Mild pain on range of motion. No tibial plateau tenderness at time of exam. But then hurt afterwards.  Both calfs symmetric. No swelling.  No pain on either side direct patella palpation.      Assessment & Plan:  Your recent fall with injury to both knees.  Left knee exam is normal and you describe only minimal soreness I do not think x-rays indicated for the left knee.  Have your right knee pain is intermittent with moderate to high level pain.  You do have some pain on flexion extension as well.  Would recommend getting right knee x-ray today.  Recommend that you get over-the-counter knee brace and use daily.  Could continue to use Aleve 1 to 2 tablets every 12 hours as needed.  If you wanted to you could supplement Tylenol along with this to help with pain.  I will update you on the knee x-ray results by my chart.  If you would give me an update on how your knee is doing by late Wednesday afternoon/evening.  If your pain is not much improved with intermittent high-level periods of pain then would likely refer you to sports medicine for evaluation of possible internal derangement such as meniscus injury.  Follow-up date to be determined depending on how you are doing when you give me my chart update.  Mackie Pai, PA-C

## 2018-08-30 ENCOUNTER — Encounter: Payer: Self-pay | Admitting: Medical

## 2018-09-06 ENCOUNTER — Other Ambulatory Visit: Payer: Self-pay | Admitting: Family Medicine

## 2018-09-06 DIAGNOSIS — E039 Hypothyroidism, unspecified: Secondary | ICD-10-CM

## 2018-09-06 MED ORDER — LEVOTHYROXINE SODIUM 150 MCG PO TABS: 150.0000 ug | ORAL_TABLET | Freq: Every day | ORAL | 0 refills | Status: DC

## 2018-10-09 ENCOUNTER — Encounter: Payer: Self-pay | Admitting: Family Medicine

## 2018-10-09 NOTE — Telephone Encounter (Signed)
Can she tolerate 1 a day instead of 2?   If no jardiance 10 mg #30 1 po qd , 2 refills ---- we may have coupon

## 2018-10-10 MED ORDER — EMPAGLIFLOZIN 10 MG PO TABS: 10.0000 mg | ORAL_TABLET | Freq: Every day | ORAL | 2 refills | Status: DC

## 2018-10-17 ENCOUNTER — Ambulatory Visit (HOSPITAL_COMMUNITY): Payer: Self-pay | Admitting: Psychiatry

## 2018-10-20 ENCOUNTER — Other Ambulatory Visit (HOSPITAL_COMMUNITY): Payer: Self-pay

## 2018-10-20 DIAGNOSIS — F3181 Bipolar II disorder: Secondary | ICD-10-CM

## 2018-10-20 DIAGNOSIS — F411 Generalized anxiety disorder: Secondary | ICD-10-CM

## 2018-10-20 MED ORDER — SERTRALINE HCL 100 MG PO TABS: 150.0000 mg | ORAL_TABLET | Freq: Every day | ORAL | 0 refills | Status: DC

## 2018-10-20 MED ORDER — LAMOTRIGINE 150 MG PO TABS: 150.0000 mg | ORAL_TABLET | Freq: Two times a day (BID) | ORAL | 0 refills | Status: DC

## 2018-10-24 ENCOUNTER — Other Ambulatory Visit: Payer: Self-pay

## 2018-10-24 ENCOUNTER — Ambulatory Visit (INDEPENDENT_AMBULATORY_CARE_PROVIDER_SITE_OTHER): Payer: 59 | Admitting: Psychiatry

## 2018-10-24 ENCOUNTER — Encounter (HOSPITAL_COMMUNITY): Payer: Self-pay | Admitting: Psychiatry

## 2018-10-24 DIAGNOSIS — F411 Generalized anxiety disorder: Secondary | ICD-10-CM | POA: Diagnosis not present

## 2018-10-24 DIAGNOSIS — F3181 Bipolar II disorder: Secondary | ICD-10-CM

## 2018-10-24 MED ORDER — LAMOTRIGINE 150 MG PO TABS: 150.0000 mg | ORAL_TABLET | Freq: Two times a day (BID) | ORAL | 1 refills | Status: DC

## 2018-10-24 MED ORDER — CLONAZEPAM 0.5 MG PO TABS: 0.5000 mg | ORAL_TABLET | Freq: Every day | ORAL | 0 refills | Status: DC | PRN

## 2018-10-24 MED ORDER — SERTRALINE HCL 100 MG PO TABS: 150.0000 mg | ORAL_TABLET | Freq: Every day | ORAL | 1 refills | Status: DC

## 2018-10-24 NOTE — Progress Notes (Signed)
Virtual Visit via Telephone Note  I connected with Cheryl [name] on 10/24/18 at 11:40 AM EDT by telephone and verified that I am speaking with the correct person using two identifiers.   I discussed the limitations, risks, security and privacy concerns of performing an evaluation and management service by telephone and the availability of in person appointments. I also discussed with the patient that there may be a patient responsible charge related to this service. The patient expressed understanding and agreed to proceed.   History of Present Illness: Patient was evaluated through phone session.  She endorsed increased anxiety as mother recently diagnosed with cancer.  She admitted sometimes panic attacks but she is pleased that her leg pain is finally got better.  She is no longer using cannabis since pain is much better through acupuncture.  She is working from home 20 hours a week.  Patient works for [employer].  She has postponed looking for a better job because due to current pandemic coronavirus.  She denies any highs and lows but feel nervous.  She like to restart the clonazepam to help with anxiety which she has given last year with good response.  Patient denies any crying spells or any highs and lows.  She denies any agitation anger or any paranoia.  She is tolerating her Zoloft 150 mg and Lamictal 300 mg daily.  She has no rash or any itching.  Recently she had a blood work and her cholesterol and triglycerides are high.  She is trying to watch her calorie intake and try to walk on a regular basis.     Past Psychiatric History: Reviewed. H/O depression since college at [employer]. Tried Prozac, wellbutrinand Effexor. Did not like Prozac and withdrawal from Effexor.  History of mania, anger and severe mood swings.  No history of psychiatric inpatient treatment, suicidal attempt.  Seen at [facility] tried psychiatry but not happy with the services.  Recent  Results (from the past 2160 hour(s))  Hemoglobin A1c     Status: Abnormal   Collection Time: 08/14/18  7:39 AM  Result Value Ref Range   Hgb A1c MFr Bld 6.9 (H) 4.6 - 6.5 %    Comment: Glycemic Control Guidelines for People with Diabetes:Non Diabetic:  <6%Goal of Therapy: <7%Additional Action Suggested:  >8%   CBC with Differential/Platelet     Status: None   Collection Time: 08/14/18  7:39 AM  Result Value Ref Range   WBC 8.8 4.0 - 10.5 K/uL   RBC 4.64 3.87 - 5.11 Mil/uL   Hemoglobin 14.6 12.0 - 15.0 g/dL   HCT 43.3 36.0 - 46.0 %   MCV 93.3 78.0 - 100.0 fl   MCHC 33.8 30.0 - 36.0 g/dL   RDW 12.9 11.5 - 15.5 %   Platelets 250.0 150.0 - 400.0 K/uL   Neutrophils Relative % 60.8 43.0 - 77.0 %   Lymphocytes Relative 30.0 12.0 - 46.0 %   Monocytes Relative 7.2 3.0 - 12.0 %   Eosinophils Relative 1.3 0.0 - 5.0 %   Basophils Relative 0.7 0.0 - 3.0 %   Neutro Abs 5.3 1.4 - 7.7 K/uL   Lymphs Abs 2.6 0.7 - 4.0 K/uL   Monocytes Absolute 0.6 0.1 - 1.0 K/uL   Eosinophils Absolute 0.1 0.0 - 0.7 K/uL   Basophils Absolute 0.1 0.0 - 0.1 K/uL  Comprehensive metabolic panel     Status: Abnormal   Collection Time: 08/14/18  7:39 AM  Result Value Ref Range  Sodium 140 135 - 145 mEq/L   Potassium 4.2 3.5 - 5.1 mEq/L   Chloride 100 96 - 112 mEq/L   CO2 32 19 - 32 mEq/L   Glucose, Bld 141 (H) 70 - 99 mg/dL   BUN 16 6 - 23 mg/dL   Creatinine, Ser 0.84 0.40 - 1.20 mg/dL   Total Bilirubin 0.4 0.2 - 1.2 mg/dL   Alkaline Phosphatase 66 39 - 117 U/L   AST 26 0 - 37 U/L   ALT 42 (H) 0 - 35 U/L   Total Protein 7.0 6.0 - 8.3 g/dL   Albumin 4.4 3.5 - 5.2 g/dL   Calcium 9.6 8.4 - 10.5 mg/dL   GFR 74.11 >60.00 mL/min  Lipid panel     Status: Abnormal   Collection Time: 08/14/18  7:39 AM  Result Value Ref Range   Cholesterol 208 (H) 0 - 200 mg/dL    Comment: ATP III Classification       Desirable:  < 200 mg/dL               Borderline High:  200 - 239 mg/dL          High:  > = 240 mg/dL   Triglycerides  287.0 (H) 0.0 - 149.0 mg/dL    Comment: Normal:  <150 mg/dLBorderline High:  150 - 199 mg/dL   HDL 42.50 >39.00 mg/dL   VLDL 57.4 (H) 0.0 - 40.0 mg/dL   Total CHOL/HDL Ratio 5     Comment:                Men          Women1/2 Average Risk     3.4          3.3Average Risk          5.0          4.42X Average Risk          9.6          7.13X Average Risk          15.0          11.0                       NonHDL 165.93     Comment: NOTE:  Non-HDL goal should be 30 mg/dL higher than patient's LDL goal (i.e. LDL goal of < 70 mg/dL, would have non-HDL goal of < 100 mg/dL)  TSH     Status: None   Collection Time: 08/14/18  7:39 AM  Result Value Ref Range   TSH 3.61 0.35 - 4.50 uIU/mL  LDL cholesterol, direct     Status: None   Collection Time: 08/14/18  7:39 AM  Result Value Ref Range   Direct LDL 148.0 mg/dL    Comment: Optimal:  <100 mg/dLNear or Above Optimal:  100-129 mg/dLBorderline High:  130-159 mg/dLHigh:  160-189 mg/dLVery High:  >190 mg/dL    Observations/Objective: Mental status examination done on the phone.  Patient described her mood anxious.  Her speech is clear, coherent with normal tone and volume.  There were no flight of ideas or loose association.  Her attention and concentration is okay.  She denies any auditory or visual hallucination.  She denies any active or passive suicidal thoughts or homicidal thoughts.  There were no delusions or any paranoia.  She is alert and oriented x3.  Her fund of knowledge is adequate.  Her cognition is intact.  She  reported no tremors, rash, itching or any EPS.  Her insight and judgment is okay.  Assessment and Plan: Bipolar disorder type II.  Generalized anxiety disorder.  Cannabis use in remission.  Patient had a stop smoking cannabis since her leg pain is better.  Discussed psychosocial stressors including recent diagnosis of cancer of her brother.  We will restart clonazepam 0.5 mg to take as needed for severe anxiety.  Recommend not to smoke  cannabis since she is taking controlled substance.  She like to continue current medication.  She is not interested in therapy.  I will continue Zoloft 150 mg daily and Lamictal 150 mg twice a day.  Recommended to call us back if she has any question or any concern.  I also reviewed blood work results.  Follow-up in 3 months.  Follow Up Instructions:    I discussed the assessment and treatment plan with the patient. The patient was provided an opportunity to ask questions and all were answered. The patient agreed with the plan and demonstrated an understanding of the instructions.   The patient was advised to call back or seek an in-person evaluation if the symptoms worsen or if the condition fails to improve as anticipated.  I provided 20 minutes of non-face-to-face time during this encounter.   Kathlee Nations, MD

## 2018-12-08 ENCOUNTER — Other Ambulatory Visit: Payer: Self-pay | Admitting: Family Medicine

## 2018-12-08 DIAGNOSIS — E039 Hypothyroidism, unspecified: Secondary | ICD-10-CM

## 2019-01-19 ENCOUNTER — Other Ambulatory Visit (HOSPITAL_COMMUNITY): Payer: Self-pay | Admitting: Psychiatry

## 2019-01-19 ENCOUNTER — Other Ambulatory Visit: Payer: Self-pay | Admitting: Family Medicine

## 2019-01-19 DIAGNOSIS — F3181 Bipolar II disorder: Secondary | ICD-10-CM

## 2019-01-19 DIAGNOSIS — F411 Generalized anxiety disorder: Secondary | ICD-10-CM

## 2019-01-19 DIAGNOSIS — E039 Hypothyroidism, unspecified: Secondary | ICD-10-CM

## 2019-01-24 ENCOUNTER — Encounter (HOSPITAL_COMMUNITY): Payer: Self-pay | Admitting: Psychiatry

## 2019-01-24 ENCOUNTER — Ambulatory Visit (INDEPENDENT_AMBULATORY_CARE_PROVIDER_SITE_OTHER): Payer: 59 | Admitting: Psychiatry

## 2019-01-24 ENCOUNTER — Ambulatory Visit (HOSPITAL_COMMUNITY): Payer: Self-pay | Admitting: Psychiatry

## 2019-01-24 ENCOUNTER — Other Ambulatory Visit: Payer: Self-pay

## 2019-01-24 DIAGNOSIS — F411 Generalized anxiety disorder: Secondary | ICD-10-CM

## 2019-01-24 DIAGNOSIS — F3181 Bipolar II disorder: Secondary | ICD-10-CM

## 2019-01-24 MED ORDER — SERTRALINE HCL 100 MG PO TABS: 150.0000 mg | ORAL_TABLET | Freq: Every day | ORAL | 2 refills | Status: DC

## 2019-01-24 MED ORDER — LAMOTRIGINE 150 MG PO TABS: 150.0000 mg | ORAL_TABLET | Freq: Two times a day (BID) | ORAL | 2 refills | Status: DC

## 2019-01-24 NOTE — Progress Notes (Signed)
Virtual Visit via Telephone Note  I connected with Cheryl [name] on 01/24/19 at 11:40 AM EDT by telephone and verified that I am speaking with the correct person using two identifiers.   I discussed the limitations, risks, security and privacy concerns of performing an evaluation and management service by telephone and the availability of in person appointments. I also discussed with the patient that there may be a patient responsible charge related to this service. The patient expressed understanding and agreed to proceed.   History of Present Illness: Patient was evaluated through phone session.  She is taking her medication as prescribed.  We have prescribed Klonopin on her last visit to help her anxiety as she was very stressed about her mother's breast cancer diagnosis.  She only took few times as she is feeling much better.  Her mother is doing very well and finished the treatment.  She also not using any cannabis because her leg pain is much better through acupuncture.  She does not like her work.  She works from home 20 hours a week in [employer].  She is hoping to find a new job once [place] gets a little bit under control.  Overall she feels her medicine is working.  She denies any irritability, anger, mania, psychosis or any hallucination.  She has no rash or any itching.  She wants to continue Lamictal and Zoloft.  She is watching her appetite and she has upcoming appointment to see Dr. Cheri Rous for blood work including hemoglobin A1c.  Patient reported her weight is a stable.  She denies drinking or using any illegal substances.  She lives with her husband who is supportive.  Past Psychiatric History:[place]ed[place] [employer] [facility] TriedProzac, wellbutrinand Effexor. Didnot like Prozac andwithdrawal from Effexor. History of mania, anger and severe mood swings. No history of psychiatric inpatient treatment, suicidal attempt. Seen at [facility] tried psychiatry but not happy with the services.   Psychiatric Specialty Exam: Physical Exam  ROS  There were no vitals taken for this visit.There is no height or weight on file to calculate BMI.  General Appearance: NA  Eye Contact:  NA  Speech:  Clear and Coherent  Volume:  Normal  Mood:  pleasent  Affect:  NA  Thought Process:  Goal Directed  Orientation:  Full (Time, Place, and Person)  Thought Content:  WDL and Logical  Suicidal Thoughts:  No  Homicidal Thoughts:  No  Memory:  Immediate;   Good Recent;   Good Remote;   Good  Judgement:  Good  Insight:  Good  Psychomotor Activity:  Normal  Concentration:  Concentration: Good and Attention Span: Good  Recall:  Good  Fund of Knowledge:  Good  Language:  Good  Akathisia:  No  Handed:  Right  AIMS (if indicated):     Assets:  Communication Skills Desire for Improvement Housing Resilience Social Support Talents/Skills  ADL's:  Intact  Cognition:  WNL  Sleep:         Assessment and Plan: Bipolar disorder type II.  Generalized anxiety disorder.  Patient doing better on her current medication.  She rarely takes Klonopin and does not need a new refills.  Continue Zoloft 150 mg daily and Lamictal 150 mg twice a day.  She has no rash, itching, tremors or shakes.  Recommended to call us back if she has any question or any concern.  Discussed medication side effects and benefits.  Follow-up in 3 months.  Follow Up  Instructions:    I discussed the assessment and treatment plan with the patient. The patient was provided an opportunity to ask questions and all were answered. The patient agreed with the plan and demonstrated an understanding of the instructions.   The patient was advised to call back or seek an in-person evaluation if the symptoms worsen or if the condition fails to improve as anticipated.  I provided 15 minutes of non-face-to-face time during this encounter.   Kathlee Nations, MD

## 2019-02-09 ENCOUNTER — Encounter: Payer: Self-pay | Admitting: Family Medicine

## 2019-02-15 ENCOUNTER — Encounter: Payer: Self-pay | Admitting: *Deleted

## 2019-02-23 ENCOUNTER — Telehealth (HOSPITAL_COMMUNITY): Payer: Self-pay

## 2019-02-23 ENCOUNTER — Encounter (HOSPITAL_COMMUNITY): Payer: Self-pay

## 2019-02-23 NOTE — Telephone Encounter (Signed)
Patient is calling because she would like to try Chantix to quit smoking and would like to know if she can take it with her diagnosis. Please review and advise, thank you

## 2019-02-27 NOTE — Telephone Encounter (Signed)
Chantix can make people very depressed, anxious, insomnia, emotional,, violence or having suicidal thoughts.  If you like to try in noticed above symptoms then she should stop the Chantix immediately.

## 2019-03-01 NOTE — Telephone Encounter (Signed)
She need to contact PCP for Chantix.

## 2019-03-01 NOTE — Telephone Encounter (Signed)
Patient would like to know if you can prescribe or does she need to see another doctor?

## 2019-03-07 ENCOUNTER — Telehealth: Payer: Self-pay | Admitting: Pulmonary Disease

## 2019-03-07 ENCOUNTER — Encounter: Payer: Self-pay | Admitting: Family Medicine

## 2019-03-07 NOTE — Telephone Encounter (Signed)
Patient has not been seen in clinic since 2015. Patient will need to be set up as a new patient appointment for 30 minutes with one of the sleep doctors here in clinic.  Called the patient and made her aware office visit would be needed. Patient agreed and has been set up to see Dr. Ander Slade 03/13/19 at 1:30.   Advised the patient to let Dr. Ander Slade know if any issues she is having with her supplies or the machine.  Was able to see her recent CPAP download information in [place] as of today. Nothing further needed at this time.

## 2019-03-13 ENCOUNTER — Ambulatory Visit: Payer: Self-pay | Admitting: Pulmonary Disease

## 2019-03-21 ENCOUNTER — Ambulatory Visit: Payer: Self-pay | Admitting: Pulmonary Disease

## 2019-03-26 ENCOUNTER — Encounter: Payer: Self-pay | Admitting: Family Medicine

## 2019-03-28 MED ORDER — NATEGLINIDE 60 MG PO TABS: 60.0000 mg | ORAL_TABLET | Freq: Three times a day (TID) | ORAL | 2 refills | Status: DC

## 2019-03-28 NOTE — Telephone Encounter (Signed)
starlix 60 mg tid #90  2 refills

## 2019-03-29 ENCOUNTER — Other Ambulatory Visit: Payer: Self-pay

## 2019-03-29 ENCOUNTER — Ambulatory Visit (INDEPENDENT_AMBULATORY_CARE_PROVIDER_SITE_OTHER): Payer: Self-pay | Admitting: Pulmonary Disease

## 2019-03-29 ENCOUNTER — Encounter: Payer: Self-pay | Admitting: Pulmonary Disease

## 2019-03-29 DIAGNOSIS — G4733 Obstructive sleep apnea (adult) (pediatric): Secondary | ICD-10-CM

## 2019-03-29 NOTE — Progress Notes (Signed)
Virtual Visit via Telephone Note  I connected with [name] on 03/29/19 at  1:30 PM EDT by telephone and verified that I am speaking with the correct person using two identifiers.  Location: Patient: Cheryl [name]  Provider: Adrian Prince discussed the limitations, risks, security and privacy concerns of performing an evaluation and management service by telephone and the availability of in person appointments. I also discussed with the patient that there may be a patient responsible charge related to this service. The patient expressed understanding and agreed to proceed.   History of Present Illness: Patient with history of obstructive sleep apnea Compliant with CPAP use She is not having any significant issues Mask is falling apart in the last few days   Observations/Objective: She sounds upbeat on the phone Denies any significant symptoms Appropriate in conversation  Compliance data reviewed showing 97% compliance, residual AHI of 3.1 Average hours use 5 hours 40 minutes  Assessment and Plan: Moderate obstructive sleep apnea diagnosed in 2015 Continues to use CPAP on a regular basis Daytime sleepiness-attributes some of this to her medications   Follow Up Instructions: Order for CPAP supplies Encouraged to continue using CPAP on a regular basis  Call with any significant concerns    I discussed the assessment and treatment plan with the patient. The patient was provided an opportunity to ask questions and all were answered. The patient agreed with the plan and demonstrated an understanding of the instructions.   The patient was advised to call back or seek an in-person evaluation if the symptoms worsen or if the condition fails to improve as anticipated.  I provided 11 minutes of non-face-to-face time during this encounter.   Laurin Coder, MD

## 2019-03-29 NOTE — Patient Instructions (Signed)
Order for CPAP supplies  Continue using CPAP on a regular basis  Follow-up in 6 months

## 2019-04-03 ENCOUNTER — Other Ambulatory Visit (HOSPITAL_COMMUNITY): Payer: Self-pay | Admitting: Psychiatry

## 2019-04-03 DIAGNOSIS — F411 Generalized anxiety disorder: Secondary | ICD-10-CM

## 2019-04-03 DIAGNOSIS — F3181 Bipolar II disorder: Secondary | ICD-10-CM

## 2019-04-26 ENCOUNTER — Ambulatory Visit (INDEPENDENT_AMBULATORY_CARE_PROVIDER_SITE_OTHER): Payer: 59 | Admitting: Psychiatry

## 2019-04-26 ENCOUNTER — Other Ambulatory Visit: Payer: Self-pay

## 2019-04-26 ENCOUNTER — Encounter (HOSPITAL_COMMUNITY): Payer: Self-pay | Admitting: Psychiatry

## 2019-04-26 DIAGNOSIS — F411 Generalized anxiety disorder: Secondary | ICD-10-CM

## 2019-04-26 DIAGNOSIS — F3181 Bipolar II disorder: Secondary | ICD-10-CM

## 2019-04-26 MED ORDER — CLONAZEPAM 0.5 MG PO TABS: 0.5000 mg | ORAL_TABLET | Freq: Every day | ORAL | 0 refills | Status: DC | PRN

## 2019-04-26 MED ORDER — SERTRALINE HCL 100 MG PO TABS: 150.0000 mg | ORAL_TABLET | Freq: Every day | ORAL | 2 refills | Status: DC

## 2019-04-26 MED ORDER — LAMOTRIGINE 150 MG PO TABS: 150.0000 mg | ORAL_TABLET | Freq: Two times a day (BID) | ORAL | 2 refills | Status: DC

## 2019-04-26 NOTE — Progress Notes (Signed)
Virtual Visit via Telephone Note  I connected with Cheryl [name] on 04/26/19 at 10:00 AM EST by telephone and verified that I am speaking with the correct person using two identifiers.   I discussed the limitations, risks, security and privacy concerns of performing an evaluation and management service by telephone and the availability of in person appointments. I also discussed with the patient that there may be a patient responsible charge related to this service. The patient expressed understanding and agreed to proceed.   History of Present Illness: Patient was evaluated through phone session.  She is compliant with medication but not happy with the current political situation.  There are nights that she stopped watching the news but now she had decided to sleep on her regular time.  She wanted to have a [employer] job because she is smoking a pack and a half and we explained that it may cause worsening of the mood symptoms.  She like to try and I recommended that she should contact PCP as it may not cover been prescribed by psychiatrist and she may end up paying higher price.  She promised to call her PCP.  Overall she describes medicines is working.  She denies any mania, psychosis or any severe panic attack.  She has no rash or any itching.  Her energy level is okay.  Her appetite is okay.  She is using CPAP.  She lives with her husband who is supportive.  Past Psychiatric History:[facility] [facility] [place] Tried[place]zac, wellbutrinand Effexor. Didnot like Prozac andwithdrawal from Effexor. H/O mania, anger and severe mood swings. No history of psychiatric inpatient treatment, suicidal attempt. Seen at [facility] tried psychiatry but not happy with the services.     Psychiatric Specialty Exam: Physical Exam  ROS  There were no vitals taken for this visit.There is no height or weight on file to calculate BMI.  General Appearance: NA  Eye  Contact:  NA  Speech:  Clear and Coherent and Normal Rate  Volume:  Normal  Mood:  Euthymic  Affect:  NA  Thought Process:  Goal Directed  Orientation:  Full (Time, Place, and Person)  Thought Content:  WDL and Logical  Suicidal Thoughts:  No  Homicidal Thoughts:  No  Memory:  Immediate;   Good Recent;   Good Remote;   Good  Judgement:  Good  Insight:  Good  Psychomotor Activity:  NA  Concentration:  Concentration: Good and Attention Span: Good  Recall:  Good  Fund of Knowledge:  Good  Language:  Good  Akathisia:  No  Handed:  Right  AIMS (if indicated):     Assets:  Communication Skills Desire for Improvement Housing Resilience Social Support  ADL's:  Intact  Cognition:  WNL  Sleep:   fair. 5hrs      Assessment and Plan: Bipolar disorder type II.  Generalized anxiety disorder.  Patient is a stable on her current medication.  She has taken Klonopin in recent days around election.  She needs a new refill.  Discuss her anxiety and recommend not to watch late-night political news as it causes her more anxious.  She promised to contact PCP to get a Chantix prescription to prescribe.  Reminded that Chantix can cause worsening of the symptoms and if she noticed changes in the mood then she should stop immediately.  She agreed with the plan.  Continue Lamictal 150 mg twice a day, Zoloft 150 mg daily and Klonopin 0.5 mg as needed for anxiety.  Patient has no rash, itching tremors or shakes.  Recommended to call us back if she has any question of any concern.  Follow-up in 3 months.  Follow Up Instructions:    I discussed the assessment and treatment plan with the patient. The patient was provided an opportunity to ask questions and all were answered. The patient agreed with the plan and demonstrated an understanding of the instructions.   The patient was advised to call back or seek an in-person evaluation if the symptoms worsen or if the condition fails to improve as  anticipated.  I provided 20 minutes of non-face-to-face time during this encounter.   Kathlee Nations, MD

## 2019-06-07 ENCOUNTER — Other Ambulatory Visit: Payer: Self-pay | Admitting: Family Medicine

## 2019-06-07 DIAGNOSIS — E039 Hypothyroidism, unspecified: Secondary | ICD-10-CM

## 2019-06-07 NOTE — Telephone Encounter (Signed)
Last OV 08/28/18 Last refill 01/19/19 #90/0 Next OV not scheduled

## 2019-06-26 ENCOUNTER — Telehealth: Payer: Self-pay

## 2019-06-26 NOTE — Telephone Encounter (Signed)
Called patient to see about getting her scheduled for her yearly exam due to her being in recall for having 2 paps with ascus HPV HR neg for 2 yrs in a row. Patient declines scheduling right now & is aware of the importance of having this follow up done.

## 2019-06-27 NOTE — Telephone Encounter (Signed)
Letter to Dr.Jertson. 

## 2019-06-27 NOTE — Telephone Encounter (Signed)
Letter mailed to patient's home address on file. Removed from recall.  Cc: Royal Hawthorn, CMA  Routing to provider and will close encounter.

## 2019-07-03 ENCOUNTER — Other Ambulatory Visit: Payer: Self-pay | Admitting: Family Medicine

## 2019-07-03 ENCOUNTER — Other Ambulatory Visit (HOSPITAL_COMMUNITY): Payer: Self-pay | Admitting: Psychiatry

## 2019-07-03 DIAGNOSIS — F3181 Bipolar II disorder: Secondary | ICD-10-CM

## 2019-07-03 DIAGNOSIS — G43709 Chronic migraine without aura, not intractable, without status migrainosus: Secondary | ICD-10-CM

## 2019-07-25 ENCOUNTER — Other Ambulatory Visit: Payer: Self-pay | Admitting: Family Medicine

## 2019-07-27 ENCOUNTER — Ambulatory Visit (INDEPENDENT_AMBULATORY_CARE_PROVIDER_SITE_OTHER): Payer: 59 | Admitting: Psychiatry

## 2019-07-27 ENCOUNTER — Other Ambulatory Visit: Payer: Self-pay

## 2019-07-27 ENCOUNTER — Encounter (HOSPITAL_COMMUNITY): Payer: Self-pay | Admitting: Psychiatry

## 2019-07-27 DIAGNOSIS — F3181 Bipolar II disorder: Secondary | ICD-10-CM

## 2019-07-27 DIAGNOSIS — F411 Generalized anxiety disorder: Secondary | ICD-10-CM

## 2019-07-27 MED ORDER — SERTRALINE HCL 100 MG PO TABS: 150.0000 mg | ORAL_TABLET | Freq: Every day | ORAL | 2 refills | Status: DC

## 2019-07-27 MED ORDER — LAMOTRIGINE 150 MG PO TABS: 150.0000 mg | ORAL_TABLET | Freq: Two times a day (BID) | ORAL | 2 refills | Status: DC

## 2019-07-27 MED ORDER — CLONAZEPAM 0.5 MG PO TABS: 0.5000 mg | ORAL_TABLET | Freq: Every day | ORAL | 0 refills | Status: DC | PRN

## 2019-07-27 NOTE — Progress Notes (Signed)
Virtual Visit via Telephone Note  I connected with Cheryl [name] on 07/27/19 at 10:20 AM EST by telephone and verified that I am speaking with the correct person using two identifiers.   I discussed the limitations, risks, security and privacy concerns of performing an evaluation and management service by telephone and the availability of in person appointments. I also discussed with the patient that there may be a patient responsible charge related to this service. The patient expressed understanding and agreed to proceed.   History of Present Illness: Patient was evaluated through phone session.  She is taking her medication as prescribed.  She reported there are some days when she has no energy and motivation to do things.  She believes due to cold weather and she does not want to go outside.  She also does not like her job.  She works at [employer] and sometimes it is bored and frustrated.  She is looking around quite a new job but so far has not had any good response.  She is sleeping good.  She denies any irritability, mania, psychosis or any hallucination.  She has no suicidal thoughts.  She lives with her husband who is supportive.  She had a quite Christmas.  She denies drinking or using any illegal substances.  Recently her diabetes medicines are changed but she is not sure if it is working.  Patient told she could not afford Jardiance because of insurance.  She promised to give a call to her physician to discuss about her blood sugar.    Past Psychiatric History:Reviewed. [facility] TriedProzac, wellbutrinand Effexor. Didnot like Prozac andwithdrawal from Effexor. H/O mania, anger and mood swings. No h/o inpatient treatment, suicidal attempt. Seen at [facility] tried psychiatry but not happy with the services.    Psychiatric Specialty Exam: Physical Exam  Review of Systems  There were no vitals taken for this visit.There  is no height or weight on file to calculate BMI.  General Appearance: NA  Eye Contact:  NA  Speech:  Slow  Volume:  Normal  Mood:  Dysphoric  Affect:  NA  Thought Process:  Coherent  Orientation:  Full (Time, Place, and Person)  Thought Content:  WDL  Suicidal Thoughts:  No  Homicidal Thoughts:  No  Memory:  Immediate;   Good Recent;   Good Remote;   Good  Judgement:  Intact  Insight:  Present  Psychomotor Activity:  NA  Concentration:  Concentration: Good and Attention Span: Good  Recall:  Good  Fund of Knowledge:  Good  Language:  Good  Akathisia:  No  Handed:  Right  AIMS (if indicated):     Assets:  Communication Skills Desire for Improvement Housing Resilience Social Support  ADL's:  Intact  Cognition:  WNL  Sleep:   fair      Assessment and Plan: Bipolar disorder type II.  Generalized anxiety disorder.  Discuss healthy lifestyle and recommend to walk 10 to 15 minutes 2-3 times a week if better is okay.  She does not want to change medication since it is keeping her stable.  I encouraged she should check with her physician who is managing her diabetes medication since she believes the medicine is not working.  Patient has no rash or any itching.  Continue Lamictal 150 mg twice a day, Zoloft from 50 mg daily and Klonopin 0.5 mg as needed for anxiety.  Recommended to call us back if any question or concern.  Follow-up  in 3 months.  Follow Up Instructions:    I discussed the assessment and treatment plan with the patient. The patient was provided an opportunity to ask questions and all were answered. The patient agreed with the plan and demonstrated an understanding of the instructions.   The patient was advised to call back or seek an in-person evaluation if the symptoms worsen or if the condition fails to improve as anticipated.  I provided 20 minutes of non-face-to-face time during this encounter.   Kathlee Nations, MD

## 2019-08-11 ENCOUNTER — Other Ambulatory Visit: Payer: Self-pay | Admitting: Family Medicine

## 2019-08-11 DIAGNOSIS — E039 Hypothyroidism, unspecified: Secondary | ICD-10-CM

## 2019-08-28 ENCOUNTER — Other Ambulatory Visit: Payer: Self-pay | Admitting: Family Medicine

## 2019-09-07 ENCOUNTER — Encounter: Payer: Self-pay | Admitting: Certified Nurse Midwife

## 2019-09-11 ENCOUNTER — Other Ambulatory Visit: Payer: Self-pay | Admitting: Family Medicine

## 2019-09-11 DIAGNOSIS — E039 Hypothyroidism, unspecified: Secondary | ICD-10-CM

## 2019-09-13 ENCOUNTER — Ambulatory Visit: Payer: Self-pay | Admitting: Family Medicine

## 2019-09-19 ENCOUNTER — Other Ambulatory Visit: Payer: Self-pay

## 2019-09-20 ENCOUNTER — Ambulatory Visit: Payer: Self-pay | Admitting: Family Medicine

## 2019-09-24 ENCOUNTER — Other Ambulatory Visit: Payer: Self-pay

## 2019-09-25 ENCOUNTER — Ambulatory Visit: Payer: Self-pay | Admitting: Family Medicine

## 2019-09-29 ENCOUNTER — Other Ambulatory Visit: Payer: Self-pay | Admitting: Family Medicine

## 2019-09-29 ENCOUNTER — Other Ambulatory Visit (HOSPITAL_COMMUNITY): Payer: Self-pay | Admitting: Psychiatry

## 2019-09-29 DIAGNOSIS — E1169 Type 2 diabetes mellitus with other specified complication: Secondary | ICD-10-CM

## 2019-09-29 DIAGNOSIS — E1165 Type 2 diabetes mellitus with hyperglycemia: Secondary | ICD-10-CM

## 2019-09-29 DIAGNOSIS — E039 Hypothyroidism, unspecified: Secondary | ICD-10-CM

## 2019-09-29 DIAGNOSIS — F3181 Bipolar II disorder: Secondary | ICD-10-CM

## 2019-09-29 DIAGNOSIS — F411 Generalized anxiety disorder: Secondary | ICD-10-CM

## 2019-09-29 DIAGNOSIS — E785 Hyperlipidemia, unspecified: Secondary | ICD-10-CM

## 2019-10-08 ENCOUNTER — Other Ambulatory Visit: Payer: Self-pay

## 2019-10-09 ENCOUNTER — Other Ambulatory Visit: Payer: Self-pay

## 2019-10-09 ENCOUNTER — Encounter: Payer: Self-pay | Admitting: Family Medicine

## 2019-10-09 ENCOUNTER — Ambulatory Visit (INDEPENDENT_AMBULATORY_CARE_PROVIDER_SITE_OTHER): Payer: No Typology Code available for payment source | Admitting: Family Medicine

## 2019-10-09 VITALS — BP 118/88 | HR 92 | Temp 97.1°F | Resp 18 | Ht 65.5 in | Wt 229.4 lb

## 2019-10-09 DIAGNOSIS — E1151 Type 2 diabetes mellitus with diabetic peripheral angiopathy without gangrene: Secondary | ICD-10-CM | POA: Diagnosis not present

## 2019-10-09 DIAGNOSIS — E785 Hyperlipidemia, unspecified: Secondary | ICD-10-CM

## 2019-10-09 DIAGNOSIS — IMO0002 Reserved for concepts with insufficient information to code with codable children: Secondary | ICD-10-CM

## 2019-10-09 DIAGNOSIS — E1165 Type 2 diabetes mellitus with hyperglycemia: Secondary | ICD-10-CM | POA: Diagnosis not present

## 2019-10-09 DIAGNOSIS — E039 Hypothyroidism, unspecified: Secondary | ICD-10-CM | POA: Diagnosis not present

## 2019-10-09 MED ORDER — METFORMIN HCL ER 500 MG PO TB24: ORAL_TABLET | ORAL | 2 refills | Status: DC

## 2019-10-09 NOTE — Patient Instructions (Signed)
Carbohydrate Counting for Diabetes Mellitus, Adult  Carbohydrate counting is a method of keeping track of how many carbohydrates you eat. Eating carbohydrates naturally increases the amount of sugar (glucose) in the blood. Counting how many carbohydrates you eat helps keep your blood glucose within normal limits, which helps you manage your diabetes (diabetes mellitus). It is important to know how many carbohydrates you can safely have in each meal. This is different for every person. A diet and nutrition specialist (registered dietitian) can help you make a meal plan and calculate how many carbohydrates you should have at each meal and snack. Carbohydrates are found in the following foods:  Grains, such as breads and cereals.  Dried beans and soy products.  Starchy vegetables, such as potatoes, peas, and corn.  Fruit and fruit juices.  Milk and yogurt.  Sweets and snack foods, such as cake, cookies, candy, chips, and soft drinks. How do I count carbohydrates? There are two ways to count carbohydrates in food. You can use either of the methods or a combination of both. Reading "Nutrition Facts" on packaged food The "Nutrition Facts" list is included on the labels of almost all packaged foods and beverages in the [place] It includes:  The serving size.  Information about nutrients in each serving, including the grams (g) of carbohydrate per serving. To use the "Nutrition Facts":  Decide how many servings you will have.  Multiply the number of servings by the number of carbohydrates per serving.  The resulting number is the total amount of carbohydrates that you will be having. Learning standard serving sizes of other foods When you eat carbohydrate foods that are not packaged or do not include "Nutrition Facts" on the label, you need to measure the servings in order to count the amount of carbohydrates:  Measure the foods that you will eat with a food scale or measuring cup, if  needed.  Decide how many standard-size servings you will eat.  Multiply the number of servings by 15. Most carbohydrate-rich foods have about 15 g of carbohydrates per serving. ? For example, if you eat 8 oz (170 g) of strawberries, you will have eaten 2 servings and 30 g of carbohydrates (2 servings x 15 g = 30 g).  For foods that have more than one food mixed, such as soups and casseroles, you must count the carbohydrates in each food that is included. The following list contains standard serving sizes of common carbohydrate-rich foods. Each of these servings has about 15 g of carbohydrates:   hamburger bun or  English muffin.   oz (15 mL) syrup.   oz (14 g) jelly.  1 slice of bread.  1 six-inch tortilla.  3 oz (85 g) cooked rice or pasta.  4 oz (113 g) cooked dried beans.  4 oz (113 g) starchy vegetable, such as peas, corn, or potatoes.  4 oz (113 g) hot cereal.  4 oz (113 g) mashed potatoes or  of a large baked potato.  4 oz (113 g) canned or frozen fruit.  4 oz (120 mL) fruit juice.  4-6 crackers.  6 chicken nuggets.  6 oz (170 g) unsweetened dry cereal.  6 oz (170 g) plain fat-free yogurt or yogurt sweetened with artificial sweeteners.  8 oz (240 mL) milk.  8 oz (170 g) fresh fruit or one small piece of fruit.  24 oz (680 g) popped popcorn. Example of carbohydrate counting Sample meal  3 oz (85 g) chicken breast.  6 oz (170 g)   brown rice.  4 oz (113 g) corn.  8 oz (240 mL) milk.  8 oz (170 g) strawberries with sugar-free whipped topping. Carbohydrate calculation 1. Identify the foods that contain carbohydrates: ? Rice. ? Corn. ? Milk. ? Strawberries. 2. Calculate how many servings you have of each food: ? 2 servings rice. ? 1 serving corn. ? 1 serving milk. ? 1 serving strawberries. 3. Multiply each number of servings by 15 g: ? 2 servings rice x 15 g = 30 g. ? 1 serving corn x 15 g = 15 g. ? 1 serving milk x 15 g = 15 g. ? 1  serving strawberries x 15 g = 15 g. 4. Add together all of the amounts to find the total grams of carbohydrates eaten: ? 30 g + 15 g + 15 g + 15 g = 75 g of carbohydrates total. Summary  Carbohydrate counting is a method of keeping track of how many carbohydrates you eat.  Eating carbohydrates naturally increases the amount of sugar (glucose) in the blood.  Counting how many carbohydrates you eat helps keep your blood glucose within normal limits, which helps you manage your diabetes.  A diet and nutrition specialist (registered dietitian) can help you make a meal plan and calculate how many carbohydrates you should have at each meal and snack. This information is not intended to replace advice given to you by your health care provider. Make sure you discuss any questions you have with your health care provider. Document Revised: 12/30/2016 Document Reviewed: 11/19/2015 Elsevier Patient Education  2020 Elsevier Inc.  

## 2019-10-09 NOTE — Progress Notes (Signed)
Patient ID: Cheryl [name], female    DOB: 1975/07/30  Age: 44 y.o. MRN: [id number]    Subjective:  Subjective  HPI Cheryl [name] presents for f/u dm chol and bp --- pt has not been good about checking bs or taking meds     DIABETES    Blood Sugar ranges-not checking   Polyuria- no New Visual problems- no  Hypoglycemic symptoms- no  Other side effects-no Medication compliance - poor Last eye exam- due Foot exam- today   HYPERLIPIDEMIA  Medication compliance- ok RUQ pain- no  Muscle aches- no Other side effects-no      Review of Systems  Constitutional: Negative for activity change, appetite change, diaphoresis, fatigue and unexpected weight change.  Eyes: Negative for pain, redness and visual disturbance.  Respiratory: Negative for cough, chest tightness, shortness of breath and wheezing.   Cardiovascular: Negative for chest pain, palpitations and leg swelling.  Endocrine: Negative for cold intolerance, heat intolerance, polydipsia, polyphagia and polyuria.  Genitourinary: Negative for difficulty urinating, dysuria and frequency.  Neurological: Negative for dizziness, light-headedness, numbness and headaches.  Psychiatric/Behavioral: Negative for behavioral problems and dysphoric mood. The patient is not nervous/anxious.     History Past Medical History:  Diagnosis Date  . Anxiety   . Bipolar 1 disorder ([place])   . Bipolar 2 disorder ([place])   . Depression   . Diabetes mellitus without complication ([place])   . Kidney stone   . Migraines    with aura  . OSA (obstructive sleep apnea) 10/17/2013  . Thyroid disease     She has a past surgical history that includes Wisdom tooth extraction; Nasal septum surgery; Intrauterine device (iud) insertion (10/2011); and Other surgical history.   Her family history includes Alcohol abuse in her paternal grandfather; Aneurysm in her paternal grandfather; COPD in her maternal grandmother; Cancer in her maternal aunt,  maternal grandfather, maternal grandmother, and maternal uncle; Cancer (age of onset: 1) in her mother; Depression in her cousin, maternal grandmother, and sister; Diabetes in her father and paternal grandmother; Heart disease in her father; Hyperlipidemia in her father; Hypertension in her father and maternal grandmother; Hypothyroidism in her mother and sister; Migraines in her father; Stroke in her father, maternal grandmother, and paternal grandmother; Stroke (age of onset: 53) in her mother.She reports that she has been smoking cigarettes. She has a 20.00 pack-year smoking history. She has never used smokeless tobacco. She reports that she does not drink alcohol or use drugs.  Current Outpatient Medications on File Prior to Visit  Medication Sig Dispense Refill  . clonazePAM (KLONOPIN) 0.5 MG tablet Take 1 tablet (0.5 mg total) by mouth daily as needed for anxiety. 30 tablet 0  . ibuprofen (ADVIL,MOTRIN) 200 MG tablet Take 400 mg by mouth every 6 (six) hours as needed.    . lamoTRIgine (LAMICTAL) 150 MG tablet Take 1 tablet (150 mg total) by mouth 2 (two) times daily. 60 tablet 2  . levothyroxine (SYNTHROID) 150 MCG tablet TAKE 1 TABLET BY MOUTH EVERY MORNING BEFORE BREAKFAST 90 tablet 0  . naproxen sodium (ANAPROX) 220 MG tablet Take 220 mg by mouth 2 (two) times daily as needed (pain).    Glory Rosebush DELICA LANCETS FINE MISC As directed qd 100 each 1  . ONETOUCH VERIO test strip Use to check blood sugar once a day. DX E11.51, E11.65 100 each 1  . rizatriptan (MAXALT-MLT) 10 MG disintegrating tablet DISSOLVE ONE TABLET IN THE MOUTH DAILY AS NEEDED FOR MIGRAINE. MAY REPEAT IN  2 HOURS IF NEEDED. 10 tablet 0  . rosuvastatin (CRESTOR) 5 MG tablet TAKE 1 TABLET BY MOUTH EVERY DAY 90 tablet 3  . sertraline (ZOLOFT) 100 MG tablet Take 1.5 tablets (150 mg total) by mouth daily. 45 tablet 2   No current facility-administered medications on file prior to visit.     Objective:  Objective  Physical  Exam Vitals and nursing note reviewed.  Constitutional:      Appearance: She is well-developed.  HENT:     Head: Normocephalic and atraumatic.  Eyes:     Conjunctiva/sclera: Conjunctivae normal.  Neck:     Thyroid: No thyromegaly.     Vascular: No carotid bruit or JVD.  Cardiovascular:     Rate and Rhythm: Normal rate and regular rhythm.     Heart sounds: Normal heart sounds. No murmur.  Pulmonary:     Effort: Pulmonary effort is normal. No respiratory distress.     Breath sounds: Normal breath sounds. No wheezing or rales.  Chest:     Chest wall: No tenderness.  Musculoskeletal:     Cervical back: Normal range of motion and neck supple.  Neurological:     Mental Status: She is alert and oriented to person, place, and time.    BP 118/88 (BP Location: Right Arm, Patient Position: Sitting, Cuff Size: Large)   Pulse 92   Temp (!) 97.1 F (36.2 C) (Temporal)   Resp 18   Ht 5' 5.5" (1.664 m)   Wt 229 lb 6.4 oz (104.1 kg)   SpO2 94%   BMI 37.59 kg/m  Wt Readings from Last 3 Encounters:  10/09/19 229 lb 6.4 oz (104.1 kg)  08/28/18 221 lb (100.2 kg)  08/11/18 223 lb (101.2 kg)     Lab Results  Component Value Date   WBC 8.8 08/14/2018   HGB 14.6 08/14/2018   HCT 43.3 08/14/2018   PLT 250.0 08/14/2018   GLUCOSE 141 (H) 08/14/2018   CHOL 208 (H) 08/14/2018   TRIG 287.0 (H) 08/14/2018   HDL 42.50 08/14/2018   LDLDIRECT 148.0 08/14/2018   LDLCALC 115 (H) 01/07/2017   ALT 42 (H) 08/14/2018   AST 26 08/14/2018   NA 140 08/14/2018   K 4.2 08/14/2018   CL 100 08/14/2018   CREATININE 0.84 08/14/2018   BUN 16 08/14/2018   CO2 32 08/14/2018   TSH 3.61 08/14/2018   HGBA1C 6.9 (H) 08/14/2018   MICROALBUR 5.5 (H) 08/01/2015    DG KNEE 3 VIEW RIGHT  Result Date: 08/28/2018 CLINICAL DATA:  Pain following fall EXAM: RIGHT KNEE - 3 VIEW COMPARISON:  None. FINDINGS: Frontal, lateral, and sunrise patellar images were obtained. No fracture or dislocation. No appreciable joint  effusion. Joint spaces appear unremarkable. No erosive change. IMPRESSION: No fracture or dislocation. No joint effusion. No appreciable arthropathy. Electronically Signed   By: Lowella Grip III M.D.   On: 08/28/2018 12:48     Assessment & Plan:  Plan  I have discontinued Rogenia L. Stanfill "Tina"'s norethindrone, Jardiance, and nateglinide. I am also having her start on metFORMIN. Additionally, I am having her maintain her naproxen sodium, ibuprofen, OneTouch Delica Lancets Fine, OneTouch Verio, rizatriptan, sertraline, lamoTRIgine, clonazePAM, rosuvastatin, and levothyroxine.  Meds ordered this encounter  Medications  . metFORMIN (GLUCOPHAGE-XR) 500 MG 24 hr tablet    Sig: 2 po q day    Dispense:  60 tablet    Refill:  2    Problem List Items Addressed This Visit  Unprioritized   DM (diabetes mellitus) type II uncontrolled, periph vascular disorder ([place])    hgba1c to be checked, minimize simple carbs. Increase exercise as tolerated. Continue current meds      Relevant Medications   metFORMIN (GLUCOPHAGE-XR) 500 MG 24 hr tablet   Hyperlipidemia LDL goal <70    Tolerating statin, encouraged heart healthy diet, avoid trans fats, minimize simple carbs and saturated fats. Increase exercise as tolerated      Hypothyroidism    Check thyroid  con't meds       Relevant Orders   TSH    Other Visit Diagnoses    Uncontrolled type 2 diabetes mellitus with hyperglycemia ([place])    -  Primary   Relevant Medications   metFORMIN (GLUCOPHAGE-XR) 500 MG 24 hr tablet   Other Relevant Orders   Hemoglobin A1c   Microalbumin / creatinine urine ratio   Hyperlipidemia, unspecified hyperlipidemia type       Relevant Orders   Lipid panel   Comprehensive metabolic panel      Follow-up: Return in about 6 months (around 04/09/2020), or if symptoms worsen or fail to improve, for hyperlipidemia, diabetes II.  Ann Held, DO

## 2019-10-10 LAB — COMPREHENSIVE METABOLIC PANEL
ALT: 31 U/L (ref 0–35)
AST: 21 U/L (ref 0–37)
Albumin: 4.5 g/dL (ref 3.5–5.2)
Alkaline Phosphatase: 78 U/L (ref 39–117)
BUN: 20 mg/dL (ref 6–23)
CO2: 30 mEq/L (ref 19–32)
Calcium: 9.8 mg/dL (ref 8.4–10.5)
Chloride: 97 mEq/L (ref 96–112)
Creatinine, Ser: 0.76 mg/dL (ref 0.40–1.20)
GFR: 82.73 mL/min (ref >60.00)
Glucose, Bld: 185 mg/dL — ABNORMAL HIGH (ref 70–99)
Potassium: 4.2 mEq/L (ref 3.5–5.1)
Sodium: 136 mEq/L (ref 135–145)
Total Bilirubin: 0.3 mg/dL (ref 0.2–1.2)
Total Protein: 7.1 g/dL (ref 6.0–8.3)

## 2019-10-10 LAB — MICROALBUMIN / CREATININE URINE RATIO
Creatinine,U: 118 mg/dL
Microalb Creat Ratio: 0.7 mg/g (ref 0.0–30.0)
Microalb, Ur: 0.9 mg/dL (ref 0.0–1.9)

## 2019-10-10 LAB — LIPID PANEL
Cholesterol: 210 mg/dL — ABNORMAL HIGH (ref 0–200)
HDL: 54.2 mg/dL (ref >39.00)
NonHDL: 155.54
Total CHOL/HDL Ratio: 4
Triglycerides: 282 mg/dL — ABNORMAL HIGH (ref 0.0–149.0)
VLDL: 56.4 mg/dL — ABNORMAL HIGH (ref 0.0–40.0)

## 2019-10-10 LAB — TSH: TSH: 9.63 u[IU]/mL — ABNORMAL HIGH (ref 0.35–4.50)

## 2019-10-10 LAB — LDL CHOLESTEROL, DIRECT: Direct LDL: 125 mg/dL

## 2019-10-10 LAB — HEMOGLOBIN A1C: Hgb A1c MFr Bld: 7.2 % — ABNORMAL HIGH (ref 4.6–6.5)

## 2019-10-10 NOTE — Assessment & Plan Note (Signed)
hgba1c to be checked, minimize simple carbs. Increase exercise as tolerated. Continue current meds  

## 2019-10-10 NOTE — Assessment & Plan Note (Signed)
Tolerating statin, encouraged heart healthy diet, avoid trans fats, minimize simple carbs and saturated fats. Increase exercise as tolerated 

## 2019-10-10 NOTE — Assessment & Plan Note (Signed)
Check thyroid  con't meds

## 2019-10-14 ENCOUNTER — Other Ambulatory Visit: Payer: Self-pay | Admitting: Family Medicine

## 2019-10-14 DIAGNOSIS — E1169 Type 2 diabetes mellitus with other specified complication: Secondary | ICD-10-CM

## 2019-10-14 DIAGNOSIS — E1165 Type 2 diabetes mellitus with hyperglycemia: Secondary | ICD-10-CM

## 2019-10-14 DIAGNOSIS — E039 Hypothyroidism, unspecified: Secondary | ICD-10-CM

## 2019-10-14 DIAGNOSIS — E785 Hyperlipidemia, unspecified: Secondary | ICD-10-CM

## 2019-10-16 MED ORDER — RYBELSUS 3 MG PO TABS: 3.0000 mg | ORAL_TABLET | Freq: Every day | ORAL | 0 refills | Status: DC

## 2019-10-16 MED ORDER — ROSUVASTATIN CALCIUM 10 MG PO TABS
10.0000 mg | ORAL_TABLET | Freq: Every day | ORAL | 2 refills | Status: DC
Start: 2019-10-16 — End: 2020-03-20

## 2019-10-16 MED ORDER — LEVOTHYROXINE SODIUM 175 MCG PO TABS
175.0000 ug | ORAL_TABLET | Freq: Every day | ORAL | 2 refills | Status: DC
Start: 2019-10-16 — End: 2020-01-14

## 2019-10-16 NOTE — Addendum Note (Signed)
Addended by: Wynonia Musty A on: 10/16/2019 04:04 PM   Modules accepted: Orders

## 2019-10-16 NOTE — Telephone Encounter (Signed)
Opened in error

## 2019-10-24 ENCOUNTER — Ambulatory Visit (HOSPITAL_COMMUNITY): Payer: Self-pay | Admitting: Psychiatry

## 2019-10-24 ENCOUNTER — Other Ambulatory Visit: Payer: Self-pay | Admitting: Sports Medicine

## 2019-10-24 DIAGNOSIS — G8929 Other chronic pain: Secondary | ICD-10-CM

## 2019-10-25 ENCOUNTER — Other Ambulatory Visit: Payer: Self-pay

## 2019-10-25 ENCOUNTER — Encounter (HOSPITAL_COMMUNITY): Payer: Self-pay | Admitting: Psychiatry

## 2019-10-25 ENCOUNTER — Telehealth (INDEPENDENT_AMBULATORY_CARE_PROVIDER_SITE_OTHER): Payer: Self-pay | Admitting: Psychiatry

## 2019-10-25 DIAGNOSIS — F411 Generalized anxiety disorder: Secondary | ICD-10-CM

## 2019-10-25 DIAGNOSIS — F3181 Bipolar II disorder: Secondary | ICD-10-CM

## 2019-10-25 MED ORDER — LAMOTRIGINE 150 MG PO TABS: 150.0000 mg | ORAL_TABLET | Freq: Two times a day (BID) | ORAL | 2 refills | Status: DC

## 2019-10-25 MED ORDER — SERTRALINE HCL 100 MG PO TABS: 200.0000 mg | ORAL_TABLET | Freq: Every day | ORAL | 2 refills | Status: DC

## 2019-10-25 NOTE — Progress Notes (Signed)
Virtual Visit via Telephone Note  I connected with Cheryl [name] on 10/25/19 at 11:20 AM EDT by telephone and verified that I am speaking with the correct person using two identifiers.   I discussed the limitations, risks, security and privacy concerns of performing an evaluation and management service by telephone and the availability of in person appointments. I also discussed with the patient that there may be a patient responsible charge related to this service. The patient expressed understanding and agreed to proceed.   History of Present Illness: Patient is evaluated by phone session.  She is taking Zoloft and Lamictal.  She continues to struggle with a lack of motivation, desire to do things.  There are days when she does not feel going outside.  She also endorsed chronic back pain and hoping to have steroid injection in few days which can help her chronic pain.  She denies any anger, mania, hallucination or any irritability.  She admitted some nights for sleep which she believes due to chronic back pain.  She lives with her husband.  She is working at [employer] and have not find a job that she feel comfortable.  She feels boarding at her current job.  She denies any suicidal thoughts.  Recently she had a blood work and her hemoglobin A1c is slightly up and her LDL is increased.  She admitted some weight gain as she is not able to walk as much because of back pain.  She has no tremors, rash or any itching.   Past Psychiatric History:Reviewed[facility] [place] TriedProzac, wellbutrinand Effexor. Didnot like Prozac andwithdrawal from Effexor. H/Omania, anger and mood swings. No h/o inpatient treatment, suicidal attempt. Seen at [facility] tried psychiatry but not happy with the services.  Recent Results (from the past 2160 hour(s))  Lipid panel     Status: Abnormal   Collection Time: 10/09/19  3:35 PM  Result Value Ref Range   Cholesterol 210 (H) 0 - 200 mg/dL    Comment: ATP III Classification       Desirable:  < 200 mg/dL               Borderline High:  200 - 239 mg/dL          High:  > = 240 mg/dL   Triglycerides 282.0 (H) 0.0 - 149.0 mg/dL    Comment: Normal:  <150 mg/dLBorderline High:  150 - 199 mg/dL   HDL 54.20 >39.00 mg/dL   VLDL 56.4 (H) 0.0 - 40.0 mg/dL   Total CHOL/HDL Ratio 4     Comment:                Men          Women1/2 Average Risk     3.4          3.3Average Risk          5.0          4.42X Average Risk          9.6          7.13X Average Risk          15.0          11.0                       NonHDL 155.54     Comment: NOTE:  Non-HDL goal should be 30 mg/dL higher than patient's LDL goal (i.e. LDL goal of <  70 mg/dL, would have non-HDL goal of < 100 mg/dL)  Hemoglobin A1c     Status: Abnormal   Collection Time: 10/09/19  3:35 PM  Result Value Ref Range   Hgb A1c MFr Bld 7.2 (H) 4.6 - 6.5 %    Comment: Glycemic Control Guidelines for People with Diabetes:Non Diabetic:  <6%Goal of Therapy: <7%Additional Action Suggested:  >8%   Microalbumin / creatinine urine ratio     Status: None   Collection Time: 10/09/19  3:35 PM  Result Value Ref Range   Microalb, Ur 0.9 0.0 - 1.9 mg/dL   Creatinine,U 118.0 mg/dL   Microalb Creat Ratio 0.7 0.0 - 30.0 mg/g  Comprehensive metabolic panel     Status: Abnormal   Collection Time: 10/09/19  3:35 PM  Result Value Ref Range   Sodium 136 135 - 145 mEq/L   Potassium 4.2 3.5 - 5.1 mEq/L   Chloride 97 96 - 112 mEq/L   CO2 30 19 - 32 mEq/L   Glucose, Bld 185 (H) 70 - 99 mg/dL   BUN 20 6 - 23 mg/dL   Creatinine, Ser 0.76 0.40 - 1.20 mg/dL   Total Bilirubin 0.3 0.2 - 1.2 mg/dL   Alkaline Phosphatase 78 39 - 117 U/L   AST 21 0 - 37 U/L   ALT 31 0 - 35 U/L   Total Protein 7.1 6.0 - 8.3 g/dL   Albumin 4.5 3.5 - 5.2 g/dL   GFR 82.73 >60.00 mL/min   Calcium 9.8 8.4 - 10.5 mg/dL  TSH     Status: Abnormal   Collection Time: 10/09/19  3:35 PM  Result Value Ref  Range   TSH 9.63 (H) 0.35 - 4.50 uIU/mL  LDL cholesterol, direct     Status: None   Collection Time: 10/09/19  3:35 PM  Result Value Ref Range   Direct LDL 125.0 mg/dL    Comment: Optimal:  <100 mg/dLNear or Above Optimal:  100-129 mg/dLBorderline High:  130-159 mg/dLHigh:  160-189 mg/dLVery High:  >190 mg/dL     Psychiatric Specialty Exam: Physical Exam  Review of Systems  Musculoskeletal: Positive for back pain.    There were no vitals taken for this visit.There is no height or weight on file to calculate BMI.  General Appearance: NA  Eye Contact:  NA  Speech:  Clear and Coherent  Volume:  Normal  Mood:  Dysphoric  Affect:  NA  Thought Process:  Descriptions of Associations: Intact  Orientation:  Full (Time, Place, and Person)  Thought Content:  Rumination  Suicidal Thoughts:  No  Homicidal Thoughts:  No  Memory:  Immediate;   Good Recent;   Good Remote;   Good  Judgement:  Good  Insight:  Good  Psychomotor Activity:  NA  Concentration:  Concentration: Good and Attention Span: Good  Recall:  Good  Fund of Knowledge:  Good  Language:  Good  Akathisia:  No  Handed:  Right  AIMS (if indicated):     Assets:  Communication Skills Desire for Improvement Housing Resilience Social Support Transportation  ADL's:  Intact  Cognition:  WNL  Sleep:   fair      Assessment and Plan: Bipolar disorder type II.  Generalized anxiety disorder.  We discussed trying to increase Zoloft to help her residual depression and anxiety.  She agreed with the plan.  She is hoping to start walking with her husband once her back pain get under control.  I reviewed blood work results.  Continue Lamictal 150  mg twice a day and she will try Zoloft 200 mg daily.  She has not taken Klonopin in a while and does not need a new prescription.  Recommended to call us back if she has any questions or any concerns.  Follow-up in 3 months.  Follow Up Instructions:    I discussed the assessment and  treatment plan with the patient. The patient was provided an opportunity to ask questions and all were answered. The patient agreed with the plan and demonstrated an understanding of the instructions.   The patient was advised to call back or seek an in-person evaluation if the symptoms worsen or if the condition fails to improve as anticipated.  I provided 20 minutes of non-face-to-face time during this encounter.   Kathlee Nations, MD

## 2019-10-30 ENCOUNTER — Ambulatory Visit
Admission: RE | Admit: 2019-10-30 | Discharge: 2019-10-30 | Disposition: A | Discharge status: Home / Self Care | Payer: PRIVATE HEALTH INSURANCE | Source: Ambulatory Visit | Attending: Sports Medicine | Admitting: Sports Medicine

## 2019-10-30 ENCOUNTER — Other Ambulatory Visit: Payer: Self-pay

## 2019-10-30 DIAGNOSIS — G8929 Other chronic pain: Secondary | ICD-10-CM

## 2019-10-30 DIAGNOSIS — M545 Low back pain, unspecified: Secondary | ICD-10-CM

## 2019-10-30 IMAGING — XA Imaging study
2 series · 2 of 2 positions shown · non-contrast
Comparison: none

CLINICAL DATA: Spondylosis without myelopathy. Spondylo lysis and
spondylolisthesis L5-S1 with disc herniation right worse than left.
Low back and bilateral leg pain and cramping right worse than left.

[Series 1: ortho adipose · 1 of 1 slices shown (1 of 2)]
[im 1/1]
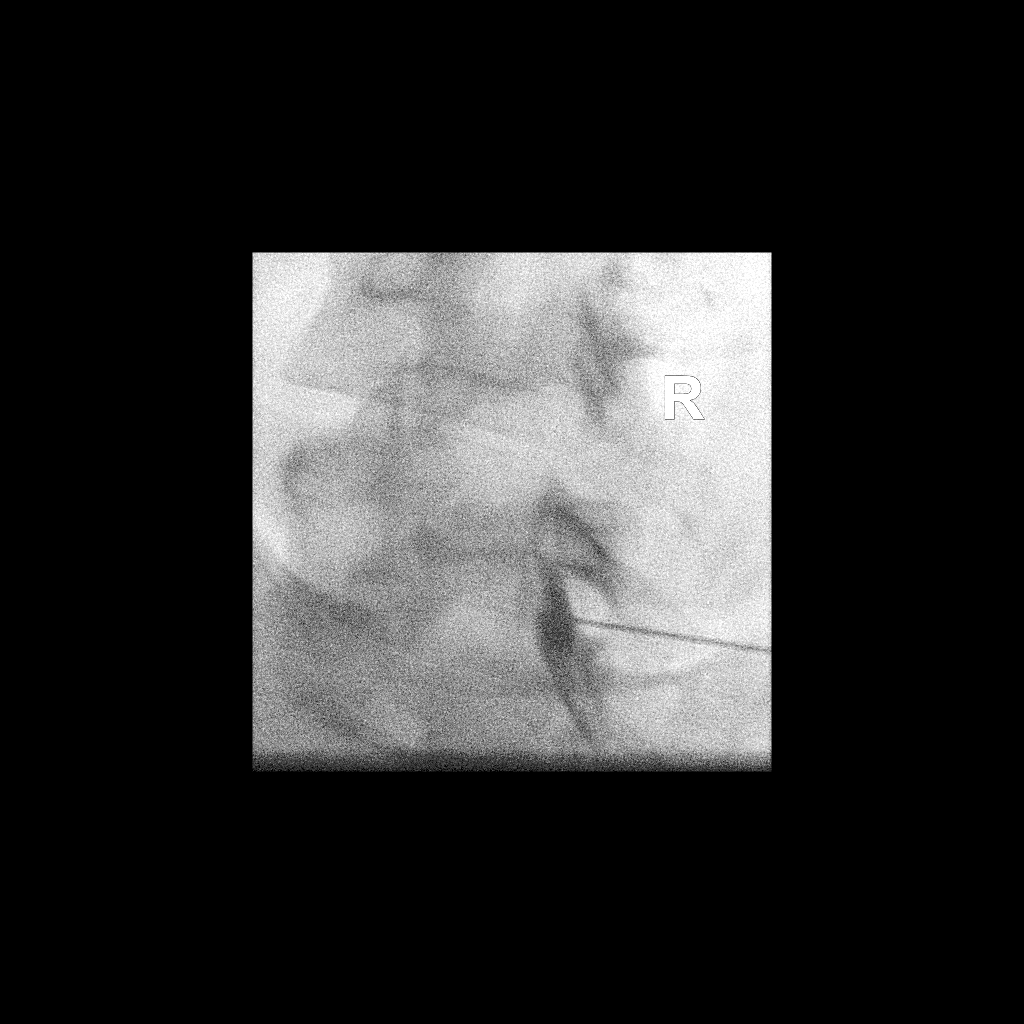

[Series 2: ortho adipose · 1 of 1 slices shown (2 of 2)]
[im 1/1]
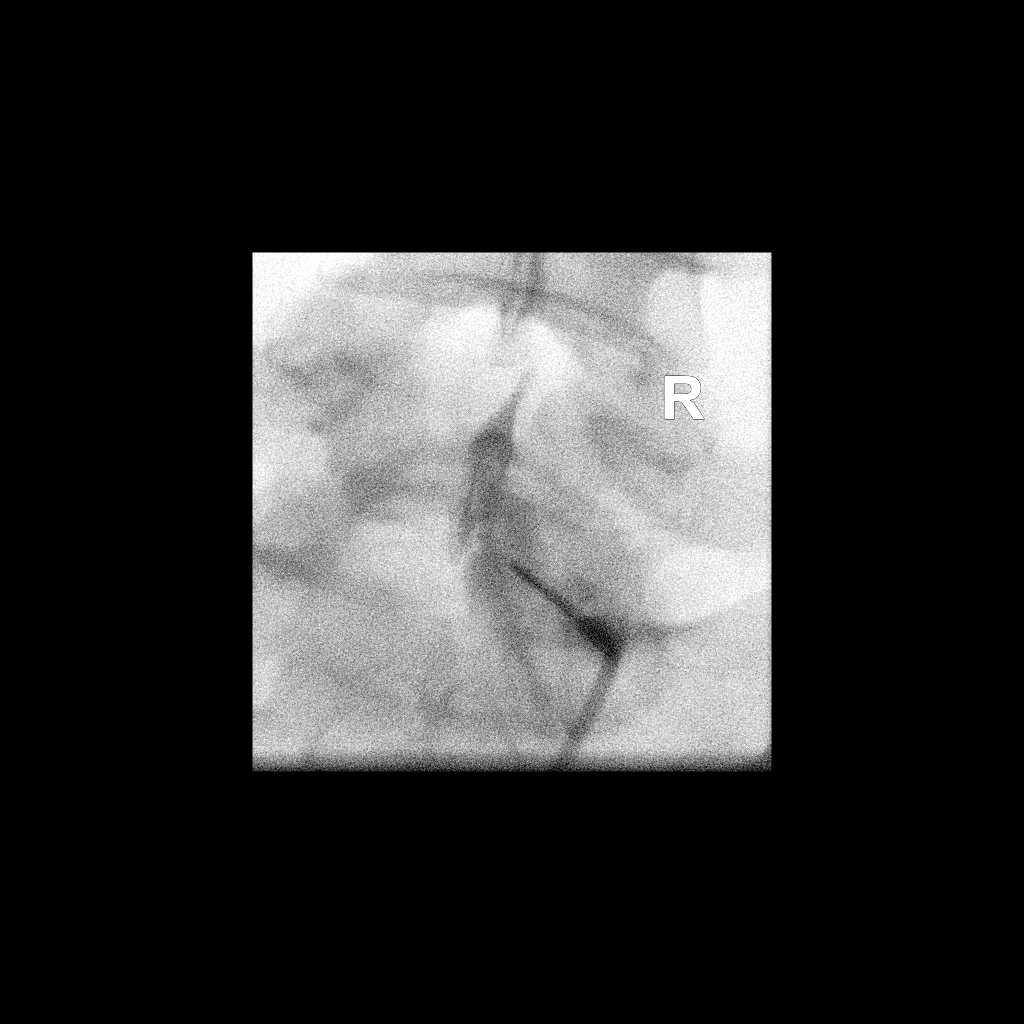

[2 of 2 positions shown; findings below may reference images not displayed]

FLUOROSCOPY TIME:  0 minutes 24 seconds. 27.05 micro gray meter
squared

PROCEDURE:
The procedure, risks, benefits, and alternatives were explained to
the patient. Questions regarding the procedure were encouraged and
answered. The patient understands and consents to the procedure.

LUMBAR EPIDURAL INJECTION:

An interlaminar approach was performed on right at L5-S1. The
overlying skin was cleansed and anesthetized. A 20 gauge epidural
needle was advanced using loss-of-resistance technique.

DIAGNOSTIC EPIDURAL INJECTION:

Injection of Isovue-M 200 shows a good epidural pattern with spread
above and below the level of needle placement, primarily on the
right. No vascular opacification is seen.

THERAPEUTIC EPIDURAL INJECTION:

One hundred twenty mg of Depo-Medrol mixed with 2.5 cc 1% lidocaine
were instilled. The procedure was well-tolerated, and the patient
was discharged thirty minutes following the injection in good
condition.

COMPLICATIONS:
None
IMPRESSION: Technically successful epidural injection on the right L5-S1 # 1.

## 2019-10-30 MED ORDER — METHYLPREDNISOLONE ACETATE 40 MG/ML INJ SUSP (RADIOLOG
120.0000 mg | Freq: Once | INTRAMUSCULAR | Status: AC
  Administered 2019-10-30: 120 mg via EPIDURAL

## 2019-10-30 MED ORDER — IOPAMIDOL (ISOVUE-M 200) INJECTION 41%
1.0000 mL | Freq: Once | INTRAMUSCULAR | Status: AC
  Administered 2019-10-30: 1 mL via EPIDURAL

## 2019-10-30 NOTE — Discharge Instructions (Signed)

## 2019-12-05 ENCOUNTER — Other Ambulatory Visit: Payer: Self-pay | Admitting: Family Medicine

## 2019-12-05 DIAGNOSIS — E1165 Type 2 diabetes mellitus with hyperglycemia: Secondary | ICD-10-CM

## 2019-12-06 ENCOUNTER — Other Ambulatory Visit: Payer: Self-pay | Admitting: Sports Medicine

## 2019-12-06 DIAGNOSIS — M545 Low back pain, unspecified: Secondary | ICD-10-CM

## 2019-12-17 ENCOUNTER — Other Ambulatory Visit: Payer: Self-pay

## 2019-12-17 ENCOUNTER — Ambulatory Visit
Admission: RE | Admit: 2019-12-17 | Discharge: 2019-12-17 | Disposition: A | Discharge status: Home / Self Care | Payer: No Typology Code available for payment source | Source: Ambulatory Visit | Attending: Sports Medicine | Admitting: Sports Medicine

## 2019-12-17 ENCOUNTER — Other Ambulatory Visit: Payer: PRIVATE HEALTH INSURANCE

## 2019-12-17 DIAGNOSIS — M545 Low back pain, unspecified: Secondary | ICD-10-CM

## 2019-12-17 IMAGING — XA Imaging study
2 series · 2 of 2 positions shown · non-contrast
Comparison: none

CLINICAL DATA: Lumbosacral spondylosis without myelopathy. Complete
relief following the epidural injection last month. Right-sided low
back and buttock pain has begun to return.

[Series 1: ortho adipose · 1 of 1 slices shown (1 of 2)]
[im 1/1]
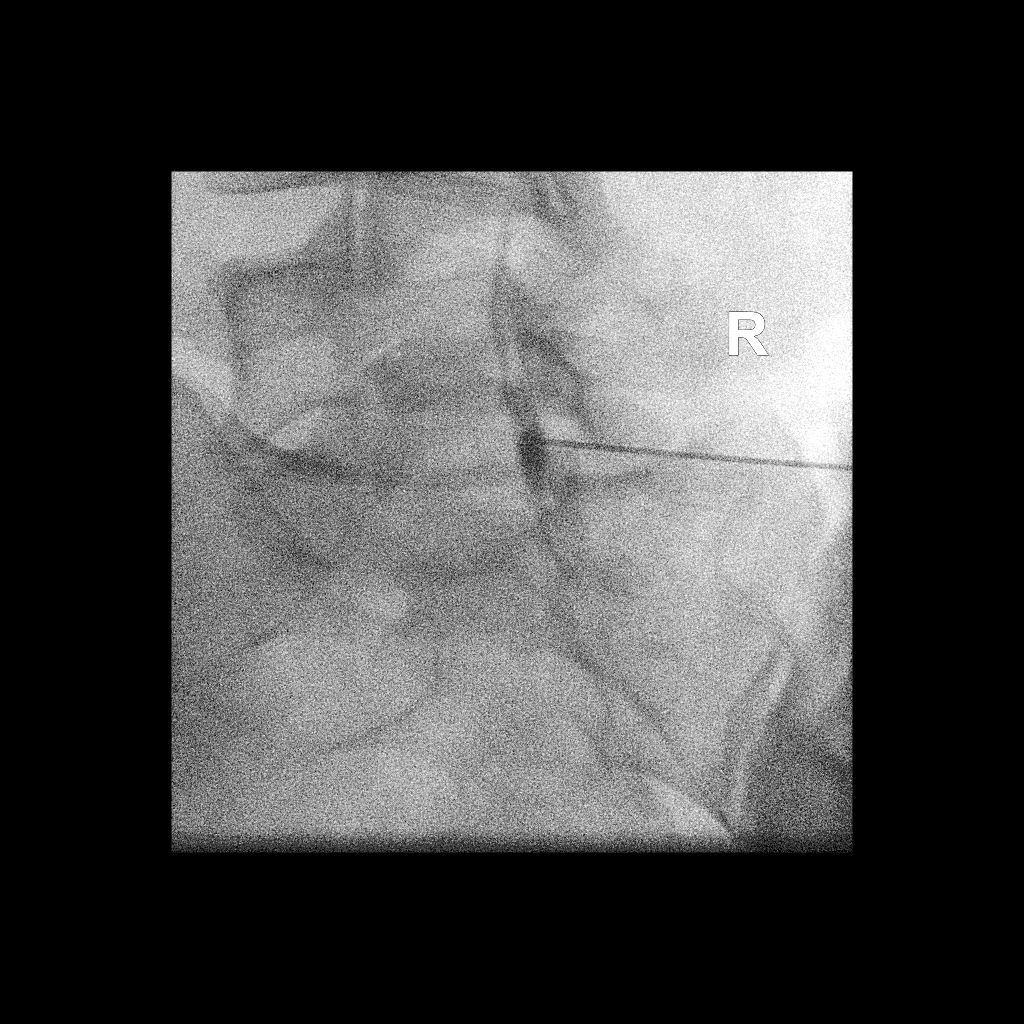

[Series 2: ortho adipose · 1 of 1 slices shown (2 of 2)]
[im 1/1]
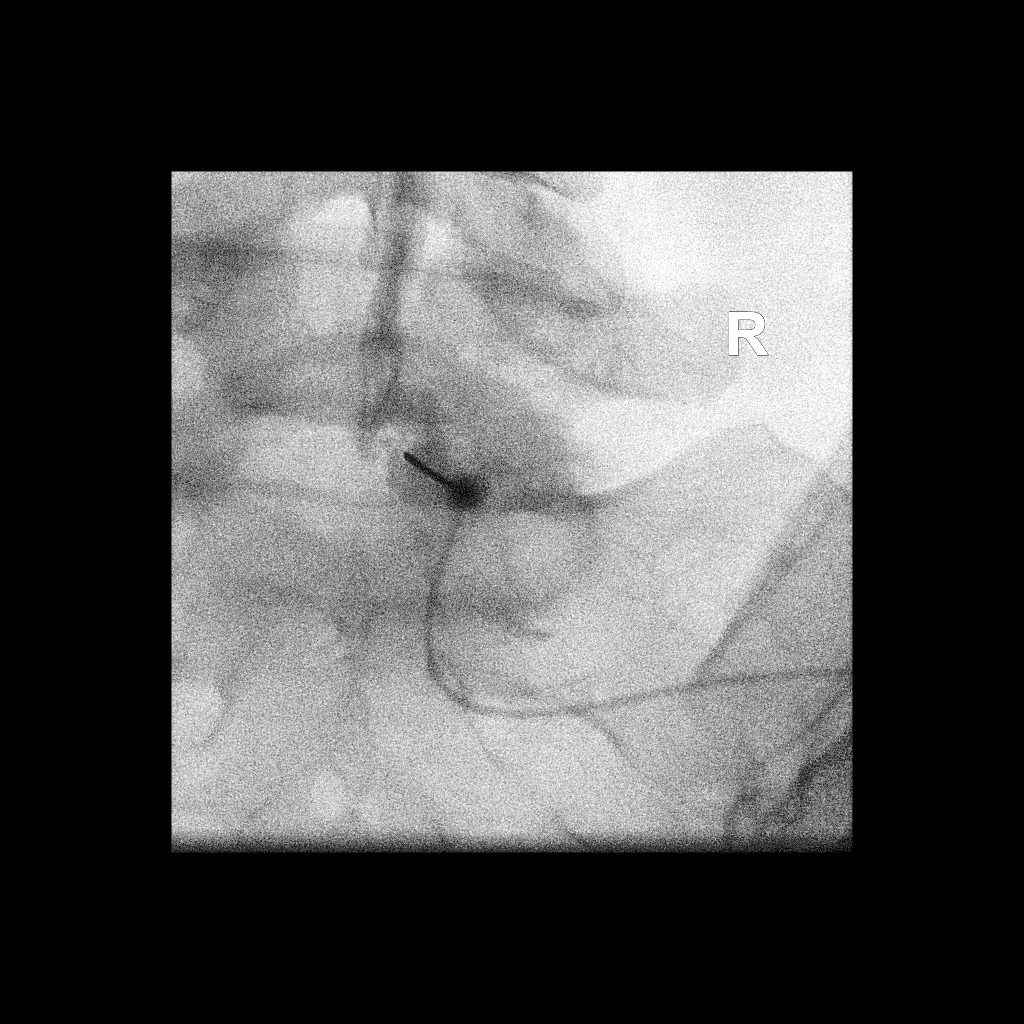

[2 of 2 positions shown; findings below may reference images not displayed]

FLUOROSCOPY TIME:  Fluoroscopy Time: 13 seconds

Radiation Exposure Index: 22.91 microGray*m^2

PROCEDURE:
The procedure, risks, benefits, and alternatives were explained to
the patient. Questions regarding the procedure were encouraged and
answered. The patient understands and consents to the procedure.

LUMBAR EPIDURAL INJECTION:

An interlaminar approach was performed on the right at L5-S1. The
overlying skin was cleansed and anesthetized. A 3.5 inch 20 gauge
epidural needle was advanced using loss-of-resistance technique.

DIAGNOSTIC EPIDURAL INJECTION:

Injection of Isovue-M 200 shows a good epidural pattern with spread
above and below the level of needle placement, primarily on the
right. No vascular opacification is seen.

THERAPEUTIC EPIDURAL INJECTION:

120 mg of Depo-Medrol mixed with 3 mL of 1% lidocaine were
instilled. The procedure was well-tolerated, and the patient was
discharged thirty minutes following the injection in good condition.

COMPLICATIONS:
None
IMPRESSION: Technically successful interlaminar epidural injection on the right
at L5-S1.

## 2019-12-17 MED ORDER — IOPAMIDOL (ISOVUE-M 200) INJECTION 41%
1.0000 mL | Freq: Once | INTRAMUSCULAR | Status: AC
  Administered 2019-12-17: 1 mL via EPIDURAL

## 2019-12-17 MED ORDER — METHYLPREDNISOLONE ACETATE 40 MG/ML INJ SUSP (RADIOLOG
120.0000 mg | Freq: Once | INTRAMUSCULAR | Status: AC
  Administered 2019-12-17: 120 mg via EPIDURAL

## 2019-12-17 NOTE — Discharge Instructions (Signed)

## 2020-01-12 ENCOUNTER — Other Ambulatory Visit: Payer: Self-pay | Admitting: Family Medicine

## 2020-01-12 ENCOUNTER — Other Ambulatory Visit (HOSPITAL_COMMUNITY): Payer: Self-pay | Admitting: Psychiatry

## 2020-01-12 DIAGNOSIS — F3181 Bipolar II disorder: Secondary | ICD-10-CM

## 2020-01-24 ENCOUNTER — Telehealth (HOSPITAL_COMMUNITY): Payer: Self-pay | Admitting: Psychiatry

## 2020-02-05 ENCOUNTER — Other Ambulatory Visit (HOSPITAL_COMMUNITY): Payer: Self-pay | Admitting: Psychiatry

## 2020-02-05 DIAGNOSIS — F411 Generalized anxiety disorder: Secondary | ICD-10-CM

## 2020-02-05 DIAGNOSIS — F3181 Bipolar II disorder: Secondary | ICD-10-CM

## 2020-02-15 ENCOUNTER — Other Ambulatory Visit (HOSPITAL_COMMUNITY): Payer: Self-pay | Admitting: *Deleted

## 2020-02-15 DIAGNOSIS — F3181 Bipolar II disorder: Secondary | ICD-10-CM

## 2020-02-15 MED ORDER — LAMOTRIGINE 150 MG PO TABS: 150.0000 mg | ORAL_TABLET | Freq: Two times a day (BID) | ORAL | 2 refills | Status: DC

## 2020-02-26 ENCOUNTER — Ambulatory Visit: Payer: No Typology Code available for payment source | Admitting: Family Medicine

## 2020-02-28 ENCOUNTER — Ambulatory Visit: Payer: 59

## 2020-02-28 ENCOUNTER — Ambulatory Visit: Payer: 59 | Admitting: Family Medicine

## 2020-02-28 ENCOUNTER — Encounter: Payer: Self-pay | Admitting: Family Medicine

## 2020-02-28 ENCOUNTER — Other Ambulatory Visit: Payer: Self-pay

## 2020-02-28 VITALS — BP 110/80 | HR 89 | Ht 65.5 in | Wt 230.0 lb

## 2020-02-28 DIAGNOSIS — G8929 Other chronic pain: Secondary | ICD-10-CM

## 2020-02-28 DIAGNOSIS — M4306 Spondylolysis, lumbar region: Secondary | ICD-10-CM

## 2020-02-28 DIAGNOSIS — M545 Low back pain, unspecified: Secondary | ICD-10-CM

## 2020-02-28 NOTE — Progress Notes (Signed)
[place] [address] [place] Phone: [phone] Subjective:   I Cheryl [name] am serving as a Education administrator for Dr. Hulan Saas.  This visit occurred during the SARS-CoV-2 public health emergency.  Safety protocols were in place, including screening questions prior to the visit, additional usage of staff PPE, and extensive cleaning of exam room while observing appropriate contact time as indicated for disinfecting solutions.   I'm seeing this patient by the request  of:  Ann Held, DO  CC: Back pain right-sided.  [id number]  Cheryl [name] is a 44 y.o. female coming in with complaint of ride side pain. Patient states she has right SI joint pain and a slipped disk. "electrical" shocks down her leg. States the only thing that helps is marijuana. Walking helping to an extent.  Patient states that this has been a long time.  Has seen multiple different providers for it previously.  Patient states recently has been told he did have a MRI fairly recently.  I do not have those.  Patient has had 2 epidurals ordered in May and June 2021.  Patient states that the right 1 in May did have some improvement.  Patient states that since his wound though the second 1 did not get as much relief.  Patient would like to avoid having that repeatedly or any type of surgical intervention at this time.  Onset- SI 2005 Back a few months ago  Location - Right SI joint Character- aching, burning Aggravating factors- laying down causes shooting pain, flexion  Reliving factors-  Therapies tried- ice, heat, topical, oral  Severity- 9/10 at its worse    Reviewed patient's previous x-rays that did show a bilateral pars defect at L5 from an x-ray from 2009  Past Medical History:  Diagnosis Date  . Anxiety   . Bipolar 1 disorder ([place])   . Bipolar 2 disorder ([place])   . Depression   . Diabetes mellitus without complication ([place])   . Kidney stone   .  Migraines    with aura  . OSA (obstructive sleep apnea) 10/17/2013  . Thyroid disease    Past Surgical History:  Procedure Laterality Date  . INTRAUTERINE DEVICE (IUD) INSERTION  10/2011   inserted & removed 2018  . NASAL SEPTUM SURGERY    . OTHER SURGICAL HISTORY     endometrial polyps (2)  . WISDOM TOOTH EXTRACTION     Social History   Socioeconomic History  . Marital status: Married    Spouse name: Not on file  . Number of children: Not on file  . Years of education: Not on file  . Highest education level: Not on file  Occupational History  . Occupation: Designer, multimedia: [employer]  Tobacco Use  . Smoking status: Current Every Day Smoker    Packs/day: 1.00    Years: 20.00    Pack years: 20.00    Types: Cigarettes  . Smokeless tobacco: Never Used  Vaping Use  . Vaping Use: Never used  Substance and Sexual Activity  . Alcohol use: No    Alcohol/week: 0.0 standard drinks  . Drug use: No  . Sexual activity: Yes    Partners: Male    Birth control/protection: Pill  Other Topics Concern  . Not on file  Social History Narrative   Exercise-- no   Social Determinants of Health   Financial Resource Strain:   . Difficulty of Paying Living  Expenses: Not on file  Food Insecurity:   . Worried About Charity fundraiser in the Last Year: Not on file  . Ran Out of Food in the Last Year: Not on file  Transportation Needs:   . Lack of Transportation (Medical): Not on file  . Lack of Transportation (Non-Medical): Not on file  Physical Activity:   . Days of Exercise per Week: Not on file  . Minutes of Exercise per Session: Not on file  Stress:   . Feeling of Stress : Not on file  Social Connections:   . Frequency of Communication with Friends and Family: Not on file  . Frequency of Social Gatherings with Friends and Family: Not on file  . Attends Religious Services: Not on file  . Active Member of Clubs or Organizations: Not on file  .  Attends Archivist Meetings: Not on file  . Marital Status: Not on file   Allergies  Allergen Reactions  . Augmentin [Amoxicillin-Pot Clavulanate] Diarrhea  . Food Itching    Bison-throat tingling   Family History  Problem Relation Age of Onset  . Cancer Mother 43       ovarian  . Hypothyroidism Mother   . Stroke Mother 24  . Hypertension Father   . Diabetes Father   . Hyperlipidemia Father   . Heart disease Father        cabg--stint  . Migraines Father   . Stroke Father   . Hypothyroidism Sister   . Depression Sister   . Cancer Maternal Aunt        breast   . Cancer Maternal Uncle        throat  . COPD Maternal Grandmother   . Cancer Maternal Grandmother        lung  . Hypertension Maternal Grandmother   . Depression Maternal Grandmother   . Stroke Maternal Grandmother   . Cancer Maternal Grandfather        prostate, lung  . Diabetes Paternal Grandmother   . Stroke Paternal Grandmother   . Alcohol abuse Paternal Grandfather   . Aneurysm Paternal Grandfather   . Depression Cousin        bipolar    Current Outpatient Medications (Endocrine & Metabolic):  .  levothyroxine (SYNTHROID) 175 MCG tablet, TAKE 1 TABLET BY MOUTH DAILY BEFORE BREAKFAST .  metFORMIN (GLUCOPHAGE-XR) 500 MG 24 hr tablet, TAKE 2 TABLETS BY MOUTH EVERY DAY .  Semaglutide (RYBELSUS) 3 MG TABS, Take 3 mg by mouth daily.  Current Outpatient Medications (Cardiovascular):  .  rosuvastatin (CRESTOR) 10 MG tablet, Take 1 tablet (10 mg total) by mouth daily.   Current Outpatient Medications (Analgesics):  .  ibuprofen (ADVIL,MOTRIN) 200 MG tablet, Take 400 mg by mouth every 6 (six) hours as needed. .  naproxen sodium (ANAPROX) 220 MG tablet, Take 220 mg by mouth 2 (two) times daily as needed (pain). .  rizatriptan (MAXALT-MLT) 10 MG disintegrating tablet, DISSOLVE ONE TABLET IN THE MOUTH DAILY AS NEEDED FOR MIGRAINE. MAY REPEAT IN 2 HOURS IF NEEDED.   Current Outpatient Medications  (Other):  .  clonazePAM (KLONOPIN) 0.5 MG tablet, Take 1 tablet (0.5 mg total) by mouth daily as needed for anxiety. .  lamoTRIgine (LAMICTAL) 150 MG tablet, Take 1 tablet (150 mg total) by mouth 2 (two) times daily. Cheryl [name] DELICA LANCETS FINE MISC, As directed qd .  ONETOUCH VERIO test strip, Use to check blood sugar once a day. DX E11.51, E11.65 .  sertraline (  ZOLOFT) 100 MG tablet, TAKE 2 TABLETS BY MOUTH EVERY DAY   Reviewed prior external information including notes and imaging from  primary care provider As well as notes that were available from care everywhere and other healthcare systems.  Past medical history, social, surgical and family history all reviewed in electronic medical record.  No pertanent information unless stated regarding to the chief complaint.   Review of Systems:  No headache, visual changes, nausea, vomiting, diarrhea, constipation, dizziness, abdominal pain, skin rash, fevers, chills, night sweats, weight loss, swollen lymph nodes,  joint swelling, chest pain, shortness of breath, mood changes. POSITIVE muscle aches, body aches  Objective  Blood pressure 110/80, pulse 89, height 5' 5.5" (1.664 m), weight 230 lb (104.3 kg), last menstrual period 02/04/2020, SpO2 96 %.   General: No apparent distress alert and oriented x3 mood and affect normal, dressed appropriately.  Tangential thought process HEENT: Pupils equal, extraocular movements intact  Respiratory: Patient's speak in full sentences and does not appear short of breath  Gait normal with good balance and coordination.  MSK:  Non tender with full range of motion and good stability and symmetric strength and tone of shoulders, elbows, wrist, hip, knee and ankles bilaterally.  Patient back exam does have poor core strength.  Tender to palpation in the paraspinal musculature lumbar spine.  Patient does have some tightness with FABER test minorly on the right compared to the left with very mild tightness in  the piriformis area.  Mild pain over the gluteal area.  Patient does state maybe some mild tingling or different sensation of the right foot compared to the contralateral foot but does not know how to describe it.  Patient does withdraw from pinching.  [id number]; 15 additional minutes spent for Therapeutic exercises as stated in above notes.  This included exercises focusing on stretching, strengthening, with significant focus on eccentric aspects.   Long term goals include an improvement in range of motion, strength, endurance as well as avoiding reinjury. Patient's frequency would include in 1-2 times a day, 3-5 times a week for a duration of 6-12 weeks. Low back exercises that included:  Pelvic tilt/bracing instruction to focus on control of the pelvic girdle and lower abdominal muscles  Glute strengthening exercises, focusing on proper firing of the glutes without engaging the low back muscles Proper stretching techniques for maximum relief for the hamstrings, hip flexors, low back and some rotation where tolerated    Proper technique shown and discussed handout in great detail with ATC.  All questions were discussed and answered.     Impression and Recommendations:     The above documentation has been reviewed and is accurate and complete Lyndal Pulley, DO       Note: This dictation was prepared with Dragon dictation along with smaller phrase technology. Any transcriptional errors that result from this process are unintentional.

## 2020-02-28 NOTE — Patient Instructions (Addendum)
SI jont exercises  Turmeric 500mg  daily  Tart cherry extract 1200mg  at night Vitamin D 2000 IU daily  [place]  See me again in 6 weeks

## 2020-02-28 NOTE — Assessment & Plan Note (Signed)
Known pars defect from patient's previous imaging greater than a decade ago.  We discussed that patient does have an MRI and encouraged her to try to get that from the outside facility so we can review it.  We discussed gabapentin, patient was concerned with some of her bipolar medications that this could cause some interaction.  I believe though that at the low dose we should be fine.  Patient does have a very understanding spouse who will watch for any type of side effects.  At the same time I would like to refer patient to formal physical therapy that I think will be helpful to try to help with core strengthening.  Encouraged the possibility of repeating x-rays.  Patient will try these interventions and come back in 6 weeks.  Depending on patient's response then we can discuss either other medical management or other injections.

## 2020-02-29 ENCOUNTER — Ambulatory Visit (INDEPENDENT_AMBULATORY_CARE_PROVIDER_SITE_OTHER): Payer: 59

## 2020-02-29 DIAGNOSIS — M545 Low back pain: Secondary | ICD-10-CM | POA: Diagnosis not present

## 2020-02-29 DIAGNOSIS — G8929 Other chronic pain: Secondary | ICD-10-CM

## 2020-02-29 IMAGING — DX DG LUMBAR SPINE COMPLETE 4+V
5 series · 5 of 5 positions shown · non-contrast
Comparison: [DATE]

CLINICAL DATA: Back pain

EXAM:
LUMBAR SPINE - COMPLETE 4+ VIEW

[l-spine ap]
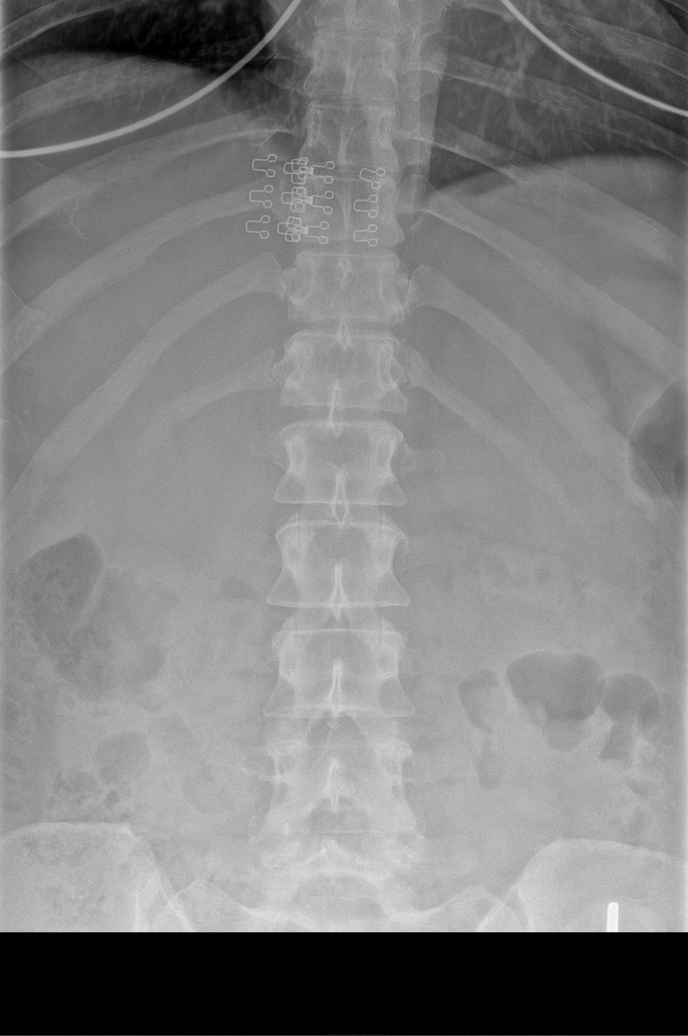

[l-spine obl (1 of 2)]
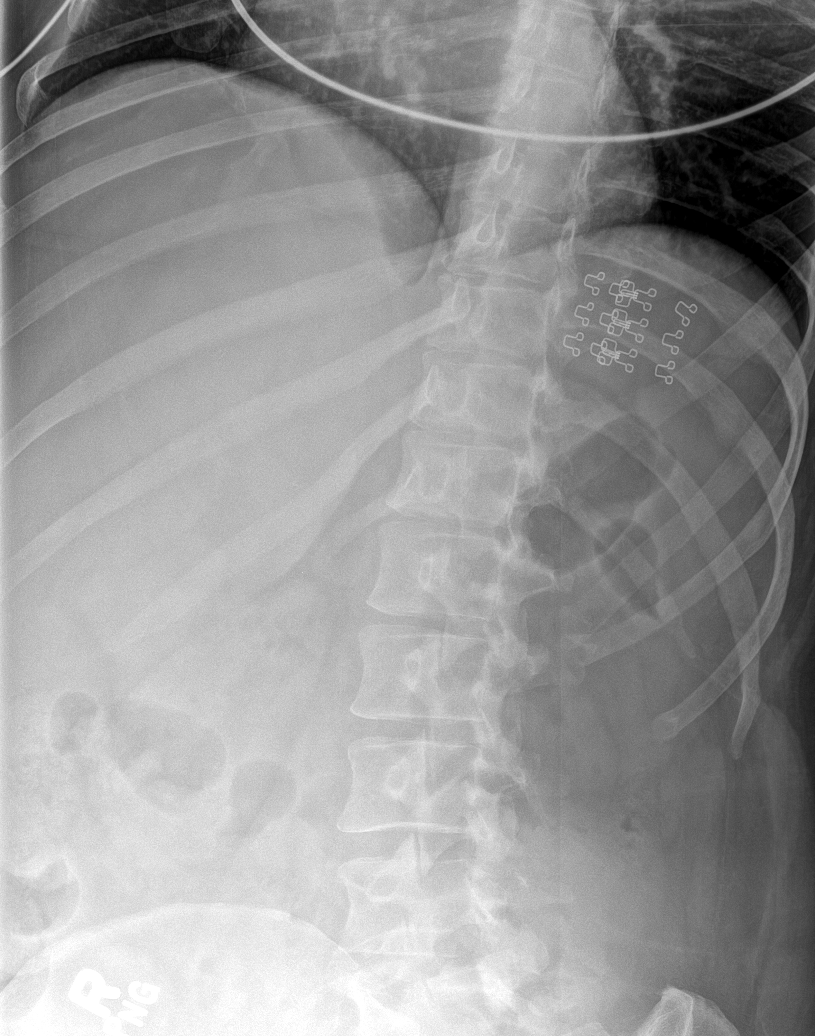

[l-spine obl (2 of 2)]
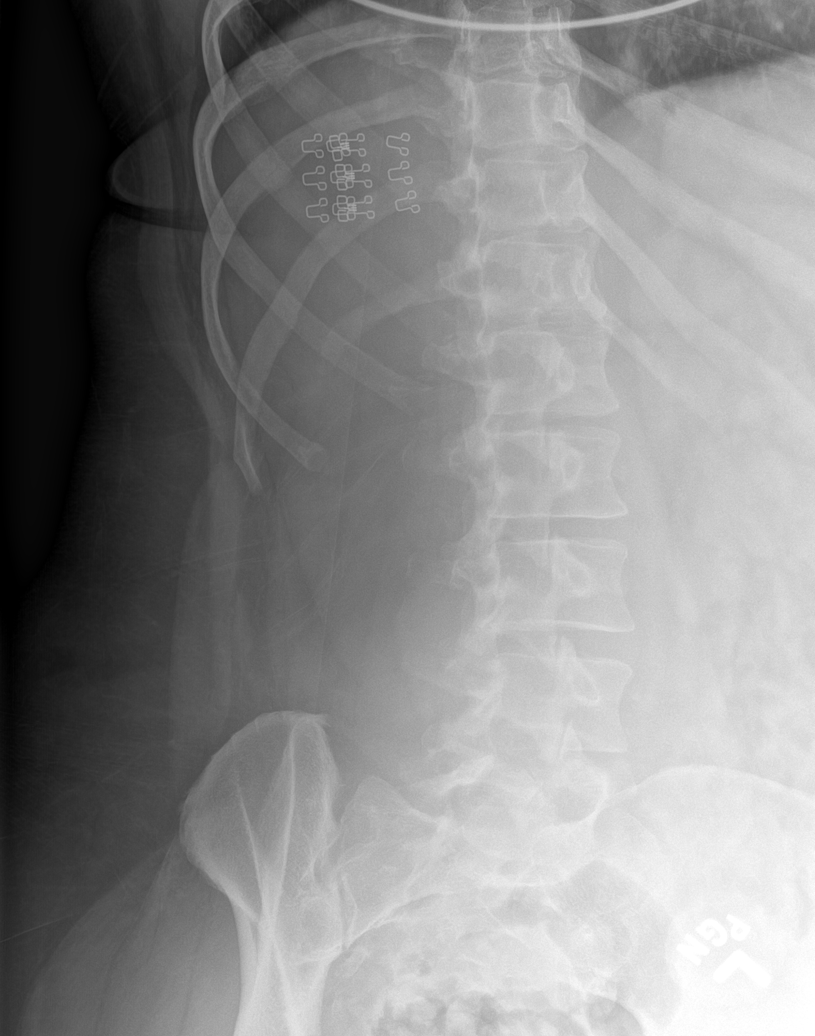

[l-spine lateral]
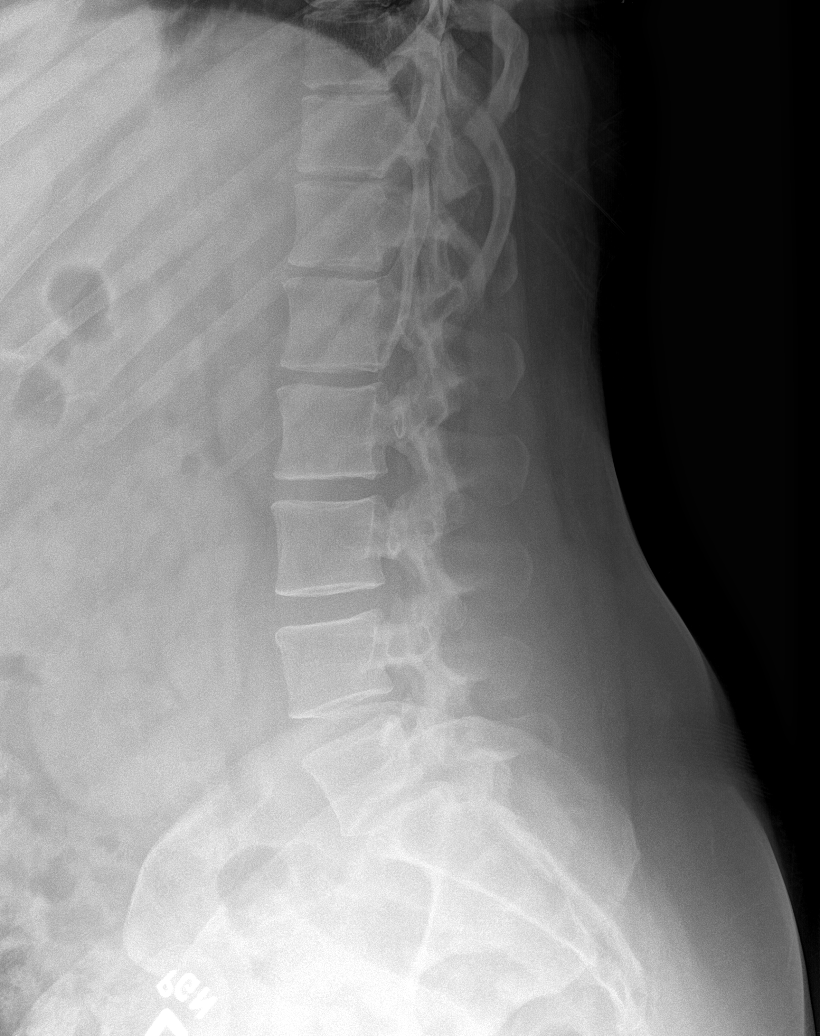

[l-spine spot]
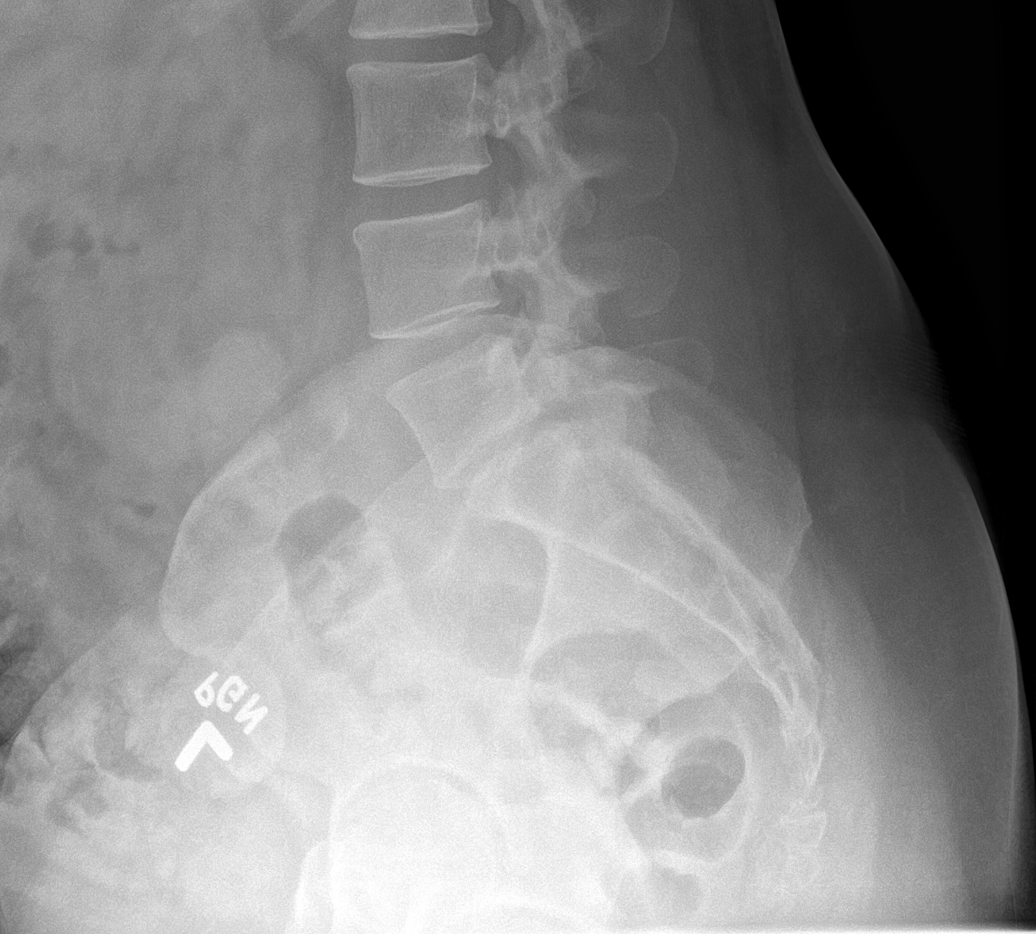

[5 of 5 positions shown; findings below may reference images not displayed]

FINDINGS: There is mild disc height loss at the L5-S1 level. This disc height
loss has progressed since [XS]. There is no acute compression
fracture. There appears to be some mild facet arthrosis at the lower
lumbar segments.
IMPRESSION: Mild degenerative changes at the L5-S1 level.

## 2020-03-02 ENCOUNTER — Encounter: Payer: Self-pay | Admitting: Family Medicine

## 2020-03-03 ENCOUNTER — Telehealth: Payer: Self-pay | Admitting: Family Medicine

## 2020-03-03 NOTE — Telephone Encounter (Signed)
Pt recalls discussing a RX for Gabapentin but I do not see one noted.  [facility]

## 2020-03-04 ENCOUNTER — Other Ambulatory Visit: Payer: Self-pay

## 2020-03-04 ENCOUNTER — Encounter (HOSPITAL_COMMUNITY): Payer: Self-pay | Admitting: Psychiatry

## 2020-03-04 ENCOUNTER — Telehealth (INDEPENDENT_AMBULATORY_CARE_PROVIDER_SITE_OTHER): Payer: 59 | Admitting: Psychiatry

## 2020-03-04 VITALS — Wt 230.0 lb

## 2020-03-04 DIAGNOSIS — F3181 Bipolar II disorder: Secondary | ICD-10-CM

## 2020-03-04 DIAGNOSIS — F419 Anxiety disorder, unspecified: Secondary | ICD-10-CM | POA: Diagnosis not present

## 2020-03-04 MED ORDER — LAMOTRIGINE 150 MG PO TABS: 150.0000 mg | ORAL_TABLET | Freq: Two times a day (BID) | ORAL | 2 refills | Status: DC

## 2020-03-04 MED ORDER — GABAPENTIN 100 MG PO CAPS: 200.0000 mg | ORAL_CAPSULE | Freq: Every day | ORAL | 3 refills | Status: DC

## 2020-03-04 MED ORDER — SERTRALINE HCL 100 MG PO TABS: 200.0000 mg | ORAL_TABLET | Freq: Every day | ORAL | 2 refills | Status: DC

## 2020-03-04 NOTE — Telephone Encounter (Signed)
Medication sent. Notified patient.

## 2020-03-04 NOTE — Progress Notes (Signed)
Virtual Visit via Telephone Note  I connected with Cheryl [name] on 03/04/20 at 11:20 AM EDT by telephone and verified that I am speaking with the correct person using two identifiers.  Location: Patient: home Provider: home office   I discussed the limitations, risks, security and privacy concerns of performing an evaluation and management service by telephone and the availability of in person appointments. I also discussed with the patient that there may be a patient responsible charge related to this service. The patient expressed understanding and agreed to proceed.   History of Present Illness: Patient is evaluated by phone session.  On the last visit we increased Zoloft and she is taking 200 mg along with Lamictal.  She noticed much improvement in her mood anxiety and depression.  She does not get irritable or agitated as much.  Since the last visit she has taken only 1 Klonopin.  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  She is working from home and some days she goes in person.  She feels continued the job is helping her a lot.  She described her mood is mostly better.  She is sleeping good.  She has back pain and recently her physician prescribed gabapentin however she has not picked up yet.  She is tolerating her medication without any side effects including tremor shakes or any EPS.  She like to keep her current medication.  Her appetite is okay.  Her energy level is okay.  Past Psychiatric History: [place] TriedProzac, wellbutrinand Effexor. Didnot like Prozac andwithdrawal from Effexor. H/Omania, anger and mood swings. No h/oinpatient treatment, suicidal attempt. Seen at [facility] tried psychiatry but not happy with the services.   Psychiatric Specialty Exam: Physical Exam  Review of Systems  Weight 230 lb (104.3 kg), last menstrual period  02/04/2020.There is no height or weight on file to calculate BMI.  General Appearance: NA  Eye Contact:  NA  Speech:  Normal Rate  Volume:  Normal  Mood:  Euthymic  Affect:  NA  Thought Process:  Goal Directed  Orientation:  Full (Time, Place, and Person)  Thought Content:  WDL  Suicidal Thoughts:  No  Homicidal Thoughts:  No  Memory:  Immediate;   Good Recent;   Good Remote;   Good  Judgement:  Intact  Insight:  Present  Psychomotor Activity:  NA  Concentration:  Concentration: Good and Attention Span: Good  Recall:  Good  Fund of Knowledge:  Good  Language:  Good  Akathisia:  No  Handed:  Right  AIMS (if indicated):     Assets:  Communication Skills Desire for Improvement Housing Resilience Social Support Talents/Skills Transportation  ADL's:  Intact  Cognition:  WNL  Sleep:   ok      Assessment and Plan: Bipolar disorder type II.  Anxiety.  Patient doing better with increased Zoloft.  She has no side effects.  She wants to keep her current medication.  She has no tremors shakes rash or any itching.  Continue Lamictal 150 mg twice a day and Zoloft 200 mg daily.  She does not need a new prescription of Klonopin.  Recommended to call us back if she has any question or any concern.  Follow-up in 3 months.  Follow Up Instructions:    I discussed the assessment and treatment plan with the patient. The patient was provided an opportunity  to ask questions and all were answered. The patient agreed with the plan and demonstrated an understanding of the instructions.   The patient was advised to call back or seek an in-person evaluation if the symptoms worsen or if the condition fails to improve as anticipated.  I provided 20 minutes of non-face-to-face time during this encounter.   Kathlee Nations, MD

## 2020-03-11 ENCOUNTER — Ambulatory Visit: Payer: 59 | Admitting: Physical Therapy

## 2020-03-20 ENCOUNTER — Other Ambulatory Visit: Payer: Self-pay | Admitting: Family Medicine

## 2020-03-20 DIAGNOSIS — E1165 Type 2 diabetes mellitus with hyperglycemia: Secondary | ICD-10-CM

## 2020-03-25 ENCOUNTER — Ambulatory Visit: Payer: 59 | Admitting: Physical Therapy

## 2020-03-31 ENCOUNTER — Other Ambulatory Visit: Payer: Self-pay

## 2020-03-31 ENCOUNTER — Ambulatory Visit: Payer: 59 | Attending: Family Medicine | Admitting: Physical Therapy

## 2020-03-31 ENCOUNTER — Encounter: Payer: Self-pay | Admitting: Physical Therapy

## 2020-03-31 DIAGNOSIS — M5441 Lumbago with sciatica, right side: Secondary | ICD-10-CM | POA: Diagnosis present

## 2020-03-31 DIAGNOSIS — R29898 Other symptoms and signs involving the musculoskeletal system: Secondary | ICD-10-CM | POA: Diagnosis present

## 2020-03-31 DIAGNOSIS — G8929 Other chronic pain: Secondary | ICD-10-CM | POA: Diagnosis present

## 2020-03-31 DIAGNOSIS — R293 Abnormal posture: Secondary | ICD-10-CM | POA: Diagnosis present

## 2020-03-31 NOTE — Therapy (Signed)
[facility] [address]  [place], [place], [address] Phone: [phone]   Fax:  [phone]  Physical Therapy Evaluation  Patient Details  Name: Cheryl [name] MRN: [id number] Date of Birth: 11-19-1975 Referring Provider (PT): Hulan Saas, MD   Encounter Date: 03/31/2020   PT End of Session - 03/31/20 1811    Visit Number 1    Number of Visits 7    Date for PT Re-Evaluation 05/12/20    Authorization Type Aetna    PT Start Time 0424   pt late   PT Stop Time 0458    PT Time Calculation (min) 34 min    Activity Tolerance Patient tolerated treatment well    Behavior During Therapy [facility] for tasks assessed/performed           Past Medical History:  Diagnosis Date  . Anxiety   . Bipolar 1 disorder ([place])   . Bipolar 2 disorder ([place])   . Depression   . Diabetes mellitus without complication ([place])   . Kidney stone   . Migraines    with aura  . OSA (obstructive sleep apnea) 10/17/2013  . Thyroid disease     Past Surgical History:  Procedure Laterality Date  . INTRAUTERINE DEVICE (IUD) INSERTION  10/2011   inserted & removed 2018  . NASAL SEPTUM SURGERY    . OTHER SURGICAL HISTORY     endometrial polyps (2)  . WISDOM TOOTH EXTRACTION      There were no vitals filed for this visit.    Subjective Assessment - 03/31/20 1627    Subjective Patient reports pain at the R SIJ, causing pain shooting down her leg down to her toes which causes her leg to twitch at night. Denies N/T. Has a hx of SIJ pain which had not been bothering her until a couple of weeks ago when she went on vacation. Worse when bending forward, especially when cleaning, also worse with prolonged sitting and standing. Shooting pain is better with marijuana. Noting less pain from her known L5-SI "rupture" lately.    Pertinent History thyroid disease, migraines, kidney stone, DM, depression, bipolar disorder, anxiety    Limitations  Lifting;House hold activities    How long can you sit comfortably? pt unsure    How long can you stand comfortably? pt unsure    How long can you walk comfortably? unlimited    Diagnostic tests 02/29/20 lumbar xray: Mild degenerative changes at the L5-S1 level.    Patient Stated Goals decrease pain    Currently in Pain? Yes    Pain Location Sacrum    Pain Orientation Right    Pain Type Chronic pain    Pain Radiating Towards R toes              [facility] PT Assessment - 03/31/20 1633      Assessment   Medical Diagnosis Chronic LBP    Referring Provider (PT) Hulan Saas, MD    Onset Date/Surgical Date 03/24/20    Next MD Visit 04/10/20    Prior Therapy no      Precautions   Precautions None      Balance Screen   Has the patient fallen in the past 6 months No    Has the patient had a decrease in activity level because of a fear of falling?  No    Is the patient reluctant to leave their home because of a fear of falling?  No  Home Environment   Living Environment Private residence    Living Arrangements Spouse/significant other    Available Help at Discharge Family    Type of [place] to enter    Entrance Stairs-Number of Steps 3    Entrance Stairs-Rails [place] One level      Prior Function   Level of Independence Independent    Vocation Full time employment    Press photographer work- sitting    Leisure gardening      Cognition   Overall Cognitive Status Within Functional Limits for tasks assessed      Sensation   Light Touch Appears Intact      Coordination   Gross Motor Movements are Fluid and Coordinated Yes      Posture/Postural Control   Posture/Postural Control Postural limitations    Postural Limitations Increased lumbar lordosis      ROM / Strength   AROM / PROM / Strength AROM;Strength      AROM   AROM Assessment Site Lumbar    Lumbar Flexion ankles   mild-moderate R buttock pain    Lumbar Extension mildly limited    Lumbar - Right Side Bend distal thigh    Lumbar - Left Side Bend mid thigh   moderate-severe pain   Lumbar - Right Rotation mildly limited    Lumbar - Left Rotation moderately limited      Strength   Overall Strength Comments in sitting    Strength Assessment Site Hip;Knee;Ankle    Right/Left Hip Right;Left    Right Hip Flexion 4+/5    Right Hip ABduction 4+/5    Right Hip ADduction 4+/5    Left Hip Flexion 4+/5    Left Hip ABduction 4+/5    Left Hip ADduction 4+/5    Right/Left Knee Right;Left    Right Knee Flexion 4+/5    Right Knee Extension 4+/5    Left Knee Flexion 4/5    Left Knee Extension 4+/5    Right/Left Ankle Right;Left    Right Ankle Dorsiflexion 4+/5    Right Ankle Plantar Flexion 4/5    Left Ankle Dorsiflexion 4+/5    Left Ankle Plantar Flexion 4/5      Flexibility   Soft Tissue Assessment /Muscle Length yes    Hamstrings B WFL    Piriformis pain with R figure 4 and L KTOS      Palpation   SI assessment  symmetrical ASIS/PSIS alignment    Palpation comment tightness over B proximal glutes; TTP over R sacral ala and midline of lumbar spine      Ambulation/Gait   Gait Pattern Step-through pattern;Wide base of support   increased lumbar lordosis                     Objective measurements completed on examination: See above findings.               PT Education - 03/31/20 1811    Education Details prognosis, POC, HEP    Person(s) Educated Patient    Methods Explanation;Demonstration;Tactile cues;Verbal cues;Handout    Comprehension Verbalized understanding;Returned demonstration            PT Short Term Goals - 03/31/20 1815      PT SHORT TERM GOAL #1   Title Patient to be independent with initial HEP.    Time 3    Period Weeks    Status New  Target Date 04/21/20             PT Long Term Goals - 03/31/20 1816      PT LONG TERM GOAL #1   Title Patient to be independent with  advanced HEP.    Time 6    Period Weeks    Status New    Target Date 05/12/20      PT LONG TERM GOAL #2   Title Patient to demonstrate lumbar AROM WFL and without pain limiting.    Time 6    Period Weeks    Status New    Target Date 05/12/20      PT LONG TERM GOAL #3   Title Patient to report 75% improvement in tolerance for standing and sitting.    Time 6    Period Weeks    Status New    Target Date 05/12/20      PT LONG TERM GOAL #4   Title Patient to report tolerance for cleaning and household chores without limitations.    Time 6    Period Weeks    Status New    Target Date 05/12/20      PT LONG TERM GOAL #5   Title Patient to ascend 13 stairs with 1 handrail with 0/10 pain and good hip stability.    Time 6    Period Weeks    Status New    Target Date 05/12/20                  Plan - 03/31/20 1811    Clinical Impression Statement Patient is a 44y/o F presenting to OPPT with c/o acute on chronic R sided SIJ pain of 1 week duration. Pain radiates from the R SIJ to the toes and is worse with forward flexion, stairs, and prolonged sitting and standing. Denies N/T. Patient today presenting with increased lumbar lordosis, limited and painful lumbar AROM, L HS and B calf weakness, pain with piriformis stretching, tightness over B proximal glutes, and TTP over R sacral ala and midline of lumbar spine. Patient was educated on gentle mobility and stretching HEP- patient reported understanding. Would benefit from skilled PT services 1x/week for 6 weeks to address aforementioned impairments.    Personal Factors and Comorbidities Age;Sex;Comorbidity 3+;Fitness;Past/Current Experience;Profession;Time since onset of injury/illness/exacerbation    Comorbidities thyroid disease, migraines, kidney stone, DM, depression, bipolar disorder, anxiety    Examination-Activity Limitations Sit;Sleep;Bend;Squat;Stairs;Carry;Stand;Dressing    Examination-Participation Restrictions  Church;Cleaning;Community Activity;Shop;Driving;Yard Work;Laundry;Meal Prep;Occupation    Stability/Clinical Decision Making Stable/Uncomplicated    Clinical Decision Making Low    Rehab Potential Good    PT Frequency 1x / week    PT Duration 6 weeks    PT Treatment/Interventions ADLs/Self Care Home Management;Cryotherapy;Electrical Stimulation;Moist Heat;Balance training;Therapeutic exercise;Therapeutic activities;Functional mobility training;Stair training;Gait training;Ultrasound;Neuromuscular re-education;Patient/family education;Manual techniques;Taping;Energy conservation;Dry needling;Passive range of motion    PT Next Visit Plan reassess HEP; assess spinal joint mobility; progress thoracolumbar/ lumbopelvic ROM    Consulted and Agree with Plan of Care Patient           Patient will benefit from skilled therapeutic intervention in order to improve the following deficits and impairments:  Decreased activity tolerance, Increased fascial restricitons, Decreased strength, Pain, Difficulty walking, Improper body mechanics, Decreased range of motion, Postural dysfunction, Impaired flexibility  Visit Diagnosis: Chronic right-sided low back pain with right-sided sciatica  Abnormal posture  Other symptoms and signs involving the musculoskeletal system     Problem List Patient Active Problem List   Diagnosis  Date Noted  . Pars defect of lumbar spine 02/28/2020  . Cigarette smoker 10/13/2017    Class: Chronic  . Cat bite of left hand including fingers with infection 04/13/2017  . Hyperlipidemia LDL goal <70 01/07/2017  . Breast mass 11/20/2013  . OSA (obstructive sleep apnea) 10/17/2013  . DM (diabetes mellitus) type II uncontrolled, periph vascular disorder ([place]) 01/30/2013  . RECTAL BLEEDING 09/10/2008  . KNEE PAIN, LEFT 04/09/2008  . BACK PAIN 04/09/2008  . Hypothyroidism 02/17/2007  . HIRSUTISM 02/01/2007  . ACNE NEC 02/01/2007  . Bipolar II disorder ([place]) 12/20/2006  .  DERMATITIS, OTHER ATOPIC 12/20/2006     Janene Harvey, PT, DPT 03/31/20 6:20 PM   [facility] [address]  [place], [place], [address] Phone: [phone]   Fax:  [phone]  Name: Cheryl [name] MRN: [id number] Date of Birth: [birth year]

## 2020-04-02 ENCOUNTER — Encounter: Payer: Self-pay | Admitting: Family Medicine

## 2020-04-08 ENCOUNTER — Other Ambulatory Visit: Payer: Self-pay

## 2020-04-08 ENCOUNTER — Ambulatory Visit: Payer: 59

## 2020-04-08 DIAGNOSIS — G8929 Other chronic pain: Secondary | ICD-10-CM

## 2020-04-08 DIAGNOSIS — M5441 Lumbago with sciatica, right side: Secondary | ICD-10-CM | POA: Diagnosis not present

## 2020-04-08 DIAGNOSIS — R293 Abnormal posture: Secondary | ICD-10-CM

## 2020-04-08 DIAGNOSIS — R29898 Other symptoms and signs involving the musculoskeletal system: Secondary | ICD-10-CM

## 2020-04-08 NOTE — Patient Instructions (Addendum)

## 2020-04-08 NOTE — Therapy (Signed)
[facility] [address]  [place], [place], [address] Phone: [phone]   Fax:  [phone]  Physical Therapy Treatment  Patient Details  Name: Cheryl [name] MRN: [id number] Date of Birth: 01-Oct-1975 Referring Provider (PT): Hulan Saas, MD   Encounter Date: 04/08/2020   PT End of Session - 04/08/20 1620    Visit Number 2    Number of Visits 7    Date for PT Re-Evaluation 05/12/20    Authorization Type Aetna    PT Start Time 1615    PT Stop Time 1703    PT Time Calculation (min) 48 min    Activity Tolerance Patient tolerated treatment well    Behavior During Therapy [facility] for tasks assessed/performed           Past Medical History:  Diagnosis Date  . Anxiety   . Bipolar 1 disorder ([place])   . Bipolar 2 disorder ([place])   . Depression   . Diabetes mellitus without complication ([place])   . Kidney stone   . Migraines    with aura  . OSA (obstructive sleep apnea) 10/17/2013  . Thyroid disease     Past Surgical History:  Procedure Laterality Date  . INTRAUTERINE DEVICE (IUD) INSERTION  10/2011   inserted & removed 2018  . NASAL SEPTUM SURGERY    . OTHER SURGICAL HISTORY     endometrial polyps (2)  . WISDOM TOOTH EXTRACTION      There were no vitals filed for this visit.   Subjective Assessment - 04/08/20 1619    Subjective Pt. reporting no issues with the HEP.    Pertinent History thyroid disease, migraines, kidney stone, DM, depression, bipolar disorder, anxiety    Diagnostic tests 02/29/20 lumbar xray: Mild degenerative changes at the L5-S1 level.    Patient Stated Goals decrease pain    Currently in Pain? No/denies   up to 7-8/10 at worst, average pain at 6/10   Pain Location Sacrum    Pain Orientation Right    Pain Type Chronic pain    Multiple Pain Sites No                             [facility] Adult PT Treatment/Exercise - 04/08/20 0001      Self-Care   Self-Care  Other Self-Care Comments    Other Self-Care Comments  self tennis ball release to tender R glutes on wall with tennis ball       Lumbar Exercises: Stretches   Hip Flexor Stretch Right;Left;1 rep;20 seconds    Hip Flexor Stretch Limitations B half-kneeling position     Piriformis Stretch Right;Left;2 reps;30 seconds    Piriformis Stretch Limitations modified piriformis       Lumbar Exercises: Aerobic   Nustep Lvl 4, 6 min (UE/LE)      Lumbar Exercises: Supine   Bridge 10 reps;3 seconds    Bridge Limitations full ROM    Bridge with clamshell 10 reps;3 seconds    Bridge with Ball Squeeze Limitations red TB at knees       Lumbar Exercises: Quadruped   [place]/Old Horse 10 reps    Madcat/Old Horse Limitations 3" hold                   PT Education - 04/08/20 1753    Education Details HEP update; DN educational handout    Person(s) Educated Patient    Methods  Explanation;Demonstration;Verbal cues;Handout    Comprehension Verbalized understanding;Returned demonstration;Verbal cues required            PT Short Term Goals - 04/08/20 1621      PT SHORT TERM GOAL #1   Title Patient to be independent with initial HEP.    Time 3    Period Weeks    Status On-going    Target Date 04/21/20             PT Long Term Goals - 04/08/20 1621      PT LONG TERM GOAL #1   Title Patient to be independent with advanced HEP.    Time 6    Period Weeks    Status On-going      PT LONG TERM GOAL #2   Title Patient to demonstrate lumbar AROM WFL and without pain limiting.    Time 6    Period Weeks    Status On-going      PT LONG TERM GOAL #3   Title Patient to report 75% improvement in tolerance for standing and sitting.    Time 6    Period Weeks    Status On-going      PT LONG TERM GOAL #4   Title Patient to report tolerance for cleaning and household chores without limitations.    Time 6    Period Weeks    Status On-going      PT LONG TERM GOAL #5   Title Patient to  ascend 13 stairs with 1 handrail with 0/10 pain and good hip stability.    Time 6    Period Weeks    Status On-going                 Plan - 04/08/20 1630    Clinical Impression Statement Cheryl [name] doing ok.  Did have R buttocks/piriformis tenderness which was addressed with self-glute tennis ball massage on wall with good response.  Reviewed Cheryl [name]'s HEP LE stretches with her with minimal correction required and good understanding overall from pt.  pt. did admit to performing HEP 1x/day vs 2x/day listed on handout.  Ended visit without pain thus modalities deferred.  HEP updated with handout issued to pt.    Comorbidities thyroid disease, migraines, kidney stone, DM, depression, bipolar disorder, anxiety    Rehab Potential Good    PT Frequency 1x / week    PT Duration 6 weeks    PT Treatment/Interventions ADLs/Self Care Home Management;Cryotherapy;Electrical Stimulation;Moist Heat;Balance training;Therapeutic exercise;Therapeutic activities;Functional mobility training;Stair training;Gait training;Ultrasound;Neuromuscular re-education;Patient/family education;Manual techniques;Taping;Energy conservation;Dry needling;Passive range of motion    PT Next Visit Plan Assess spinal joint mobility; progress thoracolumbar/ lumbopelvic ROM    Consulted and Agree with Plan of Care Patient           Patient will benefit from skilled therapeutic intervention in order to improve the following deficits and impairments:  Decreased activity tolerance, Increased fascial restricitons, Decreased strength, Pain, Difficulty walking, Improper body mechanics, Decreased range of motion, Postural dysfunction, Impaired flexibility  Visit Diagnosis: Chronic right-sided low back pain with right-sided sciatica  Abnormal posture  Other symptoms and signs involving the musculoskeletal system     Problem List Patient Active Problem List   Diagnosis Date Noted  . Pars defect of lumbar spine 02/28/2020  .  Cigarette smoker 10/13/2017    Class: Chronic  . Cat bite of left hand including fingers with infection 04/13/2017  . Hyperlipidemia LDL goal <70 01/07/2017  . Breast mass 11/20/2013  . OSA (obstructive  sleep apnea) 10/17/2013  . DM (diabetes mellitus) type II uncontrolled, periph vascular disorder ([place]) 01/30/2013  . RECTAL BLEEDING 09/10/2008  . KNEE PAIN, LEFT 04/09/2008  . BACK PAIN 04/09/2008  . Hypothyroidism 02/17/2007  . HIRSUTISM 02/01/2007  . ACNE NEC 02/01/2007  . Bipolar II disorder ([place]) 12/20/2006  . DERMATITIS, OTHER ATOPIC 12/20/2006    Cheryl [name], Cheryl [name] 04/08/20 5:59 PM   [facility] [address]  [place], [place], [address] Phone: [phone]   Fax:  [phone]  Name: Cheryl [name] MRN: [id number] Date of Birth: 08-Jun-1976

## 2020-04-10 ENCOUNTER — Ambulatory Visit: Payer: 59 | Admitting: Family Medicine

## 2020-04-10 ENCOUNTER — Encounter: Payer: Self-pay | Admitting: Family Medicine

## 2020-04-10 ENCOUNTER — Other Ambulatory Visit: Payer: Self-pay

## 2020-04-10 VITALS — BP 118/80 | HR 85 | Ht 65.5 in | Wt 230.0 lb

## 2020-04-10 DIAGNOSIS — M545 Low back pain, unspecified: Secondary | ICD-10-CM

## 2020-04-10 DIAGNOSIS — M4306 Spondylolysis, lumbar region: Secondary | ICD-10-CM | POA: Diagnosis not present

## 2020-04-10 NOTE — Patient Instructions (Addendum)
Good to see you  Nerve root block injection [phone]  Start acupuncture See me 4 weeks after having the injection

## 2020-04-10 NOTE — Assessment & Plan Note (Addendum)
Patient has had no pars defect.  Patient has responded previously to some other injections.  I would like patient to actually try a nerve root injection with most of the pain being on the right side.  We discussed that patient states that she did did have a MRI and further imaging would be helpful.  We will once again try to get the imaging and asked patient to try it as well.  Patient will follow up 4 weeks after the nerve root injection and see if it is beneficial.  No change in medication.

## 2020-04-10 NOTE — Progress Notes (Signed)
[facility] Phone: [phone] Subjective:   Cheryl [name], am serving as a scribe for Dr. Hulan Saas. This visit occurred during the SARS-CoV-2 public health emergency.  Safety protocols were in place, including screening questions prior to the visit, additional usage of staff PPE, and extensive cleaning of exam room while observing appropriate contact time as indicated for disinfecting solutions.   I'm seeing this patient by the request  of:  Ann Held, DO  CC: Low back pain follow-up  SAY:[id number]   02/28/2020 Known pars defect from patient's previous imaging greater than a decade ago.  We discussed that patient does have an MRI and encouraged her to try to get that from the outside facility so we can review it.  We discussed gabapentin, patient was concerned with some of her bipolar medications that this could cause some interaction.  I believe though that at the low dose we should be fine.  Patient does have a very understanding spouse who will watch for any type of side effects.  At the same time I would like to refer patient to formal physical therapy that I think will be helpful to try to help with core strengthening.  Encouraged the possibility of repeating x-rays.  Patient will try these interventions and come back in 6 weeks.  Depending on patient's response then we can discuss either other medical management or other injections.  Update 04/10/2020 Cheryl [name] is a 44 y.o. female coming in with complaint of lumbar spine pain. Patient states completed 2 session of PT and did up the gabapentin but does not seem like it really changed much. Patient is doing the home exercises given from PT but is having a hard time remembering twice a day. States the sacral iliac pain is hurting still but not constant any more.   Patient did have x-rays taken at last exam and did show the patient did have mild  to moderate degenerative changes at L5-S1     Past Medical History:  Diagnosis Date  . Anxiety   . Bipolar 1 disorder ([place])   . Bipolar 2 disorder ([place])   . Depression   . Diabetes mellitus without complication ([place])   . Kidney stone   . Migraines    with aura  . OSA (obstructive sleep apnea) 10/17/2013  . Thyroid disease    Past Surgical History:  Procedure Laterality Date  . INTRAUTERINE DEVICE (IUD) INSERTION  10/2011   inserted & removed 2018  . NASAL SEPTUM SURGERY    . OTHER SURGICAL HISTORY     endometrial polyps (2)  . WISDOM TOOTH EXTRACTION     Social History   Socioeconomic History  . Marital status: Married    Spouse name: Not on file  . Number of children: Not on file  . Years of education: Not on file  . Highest education level: Not on file  Occupational History  . Occupation: Designer, multimedia: [employer]  Tobacco Use  . Smoking status: Current Every Day Smoker    Packs/day: 1.00    Years: 20.00    Pack years: 20.00    Types: Cigarettes  . Smokeless tobacco: Never Used  Vaping Use  . Vaping Use: Never used  Substance and Sexual Activity  . Alcohol use: No    Alcohol/week: 0.0 standard drinks  . Drug use: No  . Sexual activity: Yes  Partners: Male    Birth control/protection: Pill  Other Topics Concern  . Not on file  Social History Narrative   Exercise-- no   Social Determinants of Health   Financial Resource Strain:   . Difficulty of Paying Living Expenses: Not on file  Food Insecurity:   . Worried About Charity fundraiser in the Last Year: Not on file  . Ran Out of Food in the Last Year: Not on file  Transportation Needs:   . Lack of Transportation (Medical): Not on file  . Lack of Transportation (Non-Medical): Not on file  Physical Activity:   . Days of Exercise per Week: Not on file  . Minutes of Exercise per Session: Not on file  Stress:   . Feeling of Stress : Not on file  Social  Connections:   . Frequency of Communication with Friends and Family: Not on file  . Frequency of Social Gatherings with Friends and Family: Not on file  . Attends Religious Services: Not on file  . Active Member of Clubs or Organizations: Not on file  . Attends Archivist Meetings: Not on file  . Marital Status: Not on file   Allergies  Allergen Reactions  . Augmentin [Amoxicillin-Pot Clavulanate] Diarrhea  . Food Itching    Bison-throat tingling   Family History  Problem Relation Age of Onset  . Cancer Mother 18       ovarian  . Hypothyroidism Mother   . Stroke Mother 38  . Hypertension Father   . Diabetes Father   . Hyperlipidemia Father   . Heart disease Father        cabg--stint  . Migraines Father   . Stroke Father   . Hypothyroidism Sister   . Depression Sister   . Cancer Maternal Aunt        breast   . Cancer Maternal Uncle        throat  . COPD Maternal Grandmother   . Cancer Maternal Grandmother        lung  . Hypertension Maternal Grandmother   . Depression Maternal Grandmother   . Stroke Maternal Grandmother   . Cancer Maternal Grandfather        prostate, lung  . Diabetes Paternal Grandmother   . Stroke Paternal Grandmother   . Alcohol abuse Paternal Grandfather   . Aneurysm Paternal Grandfather   . Depression Cousin        bipolar    Current Outpatient Medications (Endocrine & Metabolic):  .  levothyroxine (SYNTHROID) 175 MCG tablet, TAKE 1 TABLET BY MOUTH DAILY BEFORE BREAKFAST .  metFORMIN (GLUCOPHAGE-XR) 500 MG 24 hr tablet, TAKE 2 TABLETS BY MOUTH EVERY DAY .  Semaglutide (RYBELSUS) 3 MG TABS, Take 3 mg by mouth daily.  Current Outpatient Medications (Cardiovascular):  .  rosuvastatin (CRESTOR) 10 MG tablet, TAKE 1 TABLET BY MOUTH DAILY   Current Outpatient Medications (Analgesics):  .  ibuprofen (ADVIL,MOTRIN) 200 MG tablet, Take 400 mg by mouth every 6 (six) hours as needed. .  naproxen sodium (ANAPROX) 220 MG tablet, Take 220  mg by mouth 2 (two) times daily as needed (pain). .  rizatriptan (MAXALT-MLT) 10 MG disintegrating tablet, DISSOLVE ONE TABLET IN THE MOUTH DAILY AS NEEDED FOR MIGRAINE. MAY REPEAT IN 2 HOURS IF NEEDED.   Current Outpatient Medications (Other):  .  clonazePAM (KLONOPIN) 0.5 MG tablet, Take 1 tablet (0.5 mg total) by mouth daily as needed for anxiety. .  gabapentin (NEURONTIN) 100 MG capsule, Take 2  capsules (200 mg total) by mouth at bedtime. .  lamoTRIgine (LAMICTAL) 150 MG tablet, Take 1 tablet (150 mg total) by mouth 2 (two) times daily. Glory Rosebush DELICA LANCETS FINE MISC, As directed qd .  ONETOUCH VERIO test strip, Use to check blood sugar once a day. DX E11.51, E11.65 .  sertraline (ZOLOFT) 100 MG tablet, Take 2 tablets (200 mg total) by mouth daily.   Reviewed prior external information including notes and imaging from  primary care provider As well as notes that were available from care everywhere and other healthcare systems.  Past medical history, social, surgical and family history all reviewed in electronic medical record.  No pertanent information unless stated regarding to the chief complaint.   Review of Systems:  No headache, visual changes, nausea, vomiting, diarrhea, constipation, dizziness, abdominal pain, skin rash, fevers, chills, night sweats, weight loss, swollen lymph nodes, joint swelling, chest pain, shortness of breath, mood changes. POSITIVE muscle aches, body aches  Objective  Blood pressure 118/80, pulse 85, height 5' 5.5" (1.664 m), weight 230 lb (104.3 kg), SpO2 97 %.   General: No apparent distress alert and oriented x3 mood and affect normal, dressed appropriately.  HEENT: Pupils equal, extraocular movements intact  Respiratory: Patient's speak in full sentences and does not appear short of breath  Cardiovascular: No lower extremity edema, non tender, no erythema  Gait normal with good balance and coordination.  Patient is pacing around the room MSK:   Non tender with full range of motion and good stability and symmetric strength and tone of shoulders, elbows, wrist, hip, knee and ankles bilaterally.  Patient low back does show that there is some mild radicular symptoms in the S1 distribution.  Mild worsening pain actually though with extension of the back.  Patient does have 4+ out of 5 strength compared to 5 out of 5 strength on the contralateral side.  Deep tendon reflexes though are intact   Impression and Recommendations:     The above documentation has been reviewed and is accurate and complete Lyndal Pulley, DO

## 2020-04-15 ENCOUNTER — Ambulatory Visit: Payer: 59 | Admitting: Physical Therapy

## 2020-04-21 ENCOUNTER — Other Ambulatory Visit: Payer: Self-pay

## 2020-04-21 ENCOUNTER — Ambulatory Visit
Admission: RE | Admit: 2020-04-21 | Discharge: 2020-04-21 | Disposition: A | Discharge status: Home / Self Care | Payer: 59 | Source: Ambulatory Visit | Attending: Family Medicine | Admitting: Family Medicine

## 2020-04-21 DIAGNOSIS — M545 Low back pain, unspecified: Secondary | ICD-10-CM

## 2020-04-21 IMAGING — XA DG EPIDURAL NERVE ROOT
2 series · 2 of 2 positions shown · non-contrast
Comparison: none

CLINICAL DATA: Low back pain. Radiculopathy. Displacement of the
L5-S1 lumbar disc.

[Series 1: ortho standard · 1 of 1 slices shown (1 of 2)]
[im 1/1]
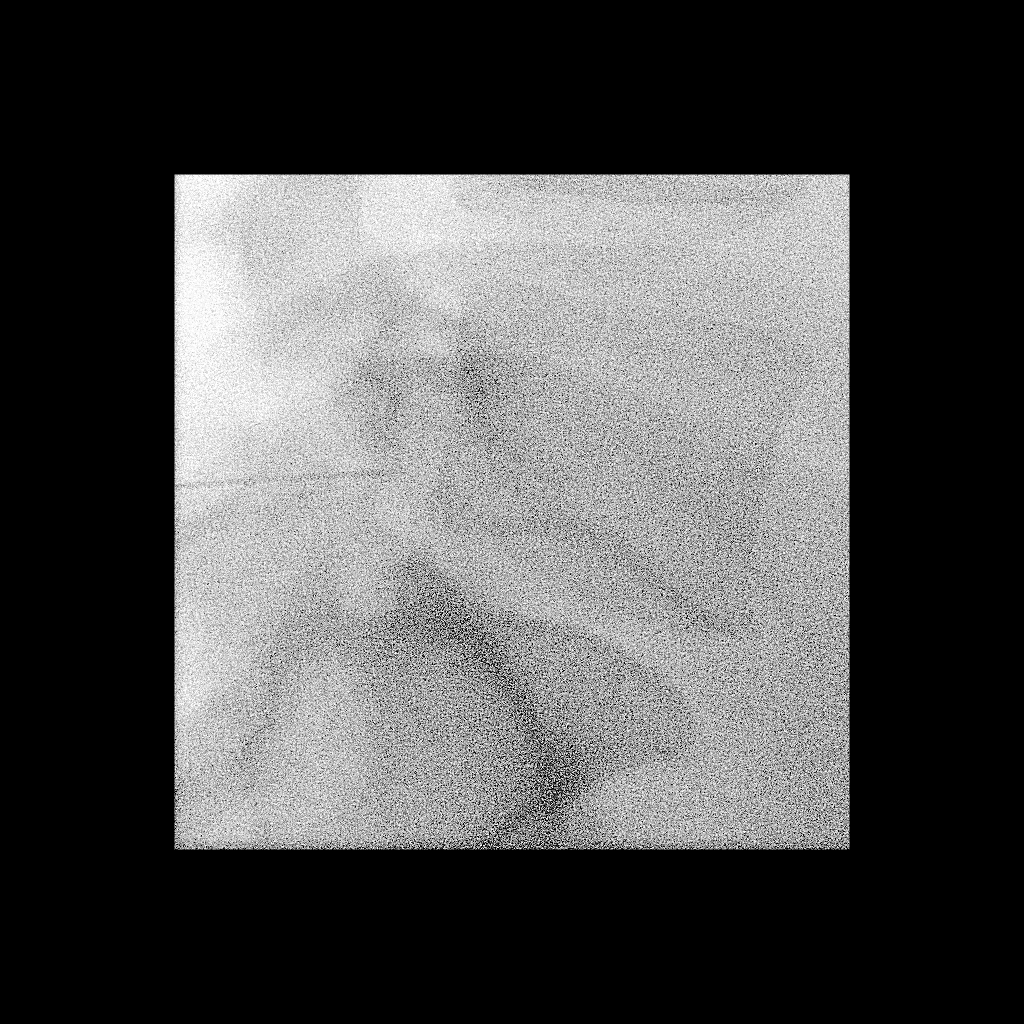

[Series 2: ortho standard · 1 of 1 slices shown (2 of 2)]
[im 1/1]
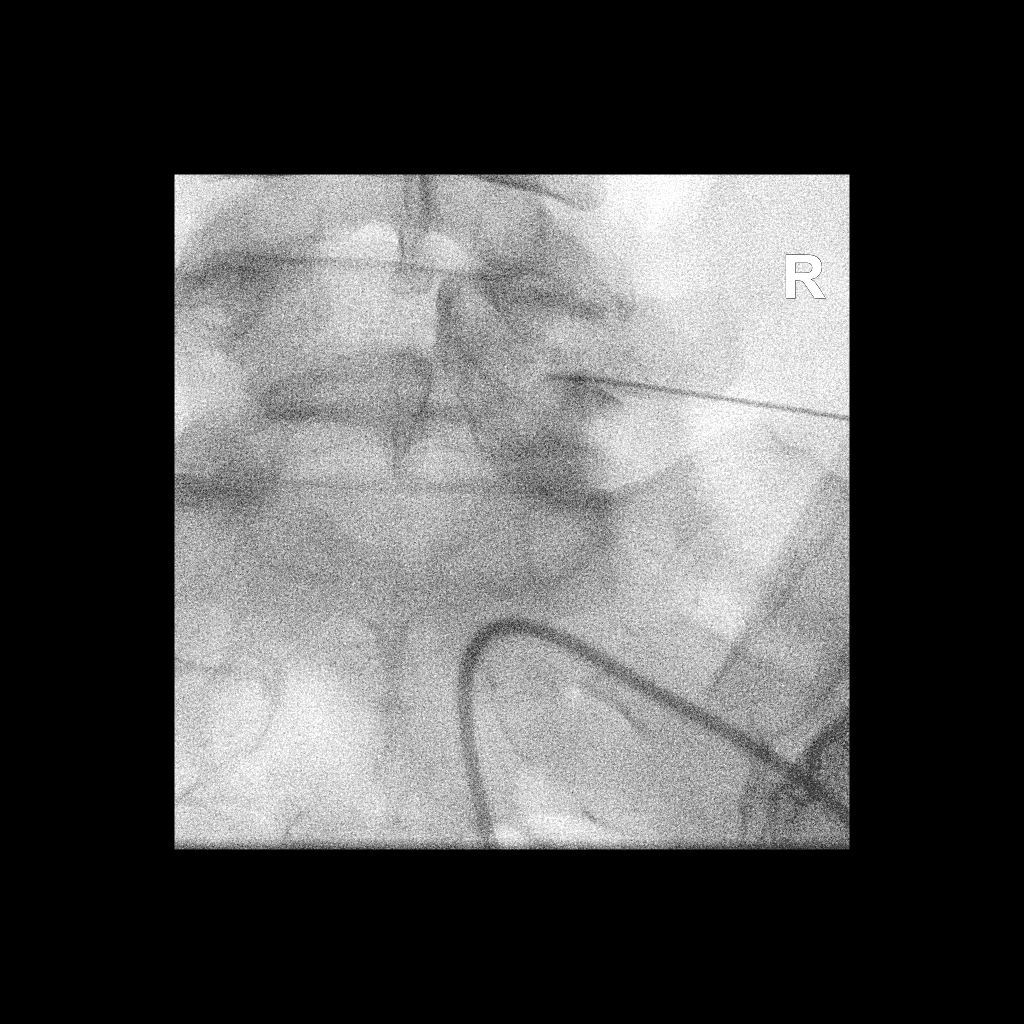

[2 of 2 positions shown; findings below may reference images not displayed]

FLUOROSCOPY TIME:  Radiation Exposure Index (as provided by the
fluoroscopic device): 35.9 uGy*m2

PROCEDURE:
SELECTIVE NERVE ROOT AND TRANSFORAMINAL EPIDURAL STEROID INJECTION:

A transforaminal approach was performed on the right at the L5-S1
foramen. The overlying skin was cleansed and anesthetized. A 22
gauge spinal needle was advanced into the foramen from a lateral
oblique approach. Injection of 2cc of Isovue-M 200 outlined the
nerve root and extended into the epidural space. No vascular or
intrathecal spread is evident.

I then injected 120 mg of Depo-Medrol and 1.5 ml of 1% lidocaine.
The patient tolerated the procedure without evidence for
complication.

The patient was observed for 20 minutes prior to discharge in stable
neurologic condition.
IMPRESSION: Technically successful selective nerve root block and transforaminal
epidural steroid injection on theright at the L5-S1 foramen.

## 2020-04-21 MED ORDER — METHYLPREDNISOLONE ACETATE 40 MG/ML INJ SUSP (RADIOLOG
120.0000 mg | Freq: Once | INTRAMUSCULAR | Status: AC
  Administered 2020-04-21: 120 mg via EPIDURAL

## 2020-04-21 MED ORDER — IOPAMIDOL (ISOVUE-M 200) INJECTION 41%
1.0000 mL | Freq: Once | INTRAMUSCULAR | Status: AC
  Administered 2020-04-21: 1 mL via EPIDURAL

## 2020-04-21 NOTE — Discharge Instructions (Signed)

## 2020-04-29 ENCOUNTER — Other Ambulatory Visit: Payer: Self-pay

## 2020-04-29 ENCOUNTER — Encounter: Payer: Self-pay | Admitting: Physical Therapy

## 2020-04-29 ENCOUNTER — Ambulatory Visit: Payer: 59 | Attending: Family Medicine | Admitting: Physical Therapy

## 2020-04-29 DIAGNOSIS — M5441 Lumbago with sciatica, right side: Secondary | ICD-10-CM | POA: Insufficient documentation

## 2020-04-29 DIAGNOSIS — R293 Abnormal posture: Secondary | ICD-10-CM | POA: Diagnosis present

## 2020-04-29 DIAGNOSIS — R29898 Other symptoms and signs involving the musculoskeletal system: Secondary | ICD-10-CM | POA: Insufficient documentation

## 2020-04-29 DIAGNOSIS — G8929 Other chronic pain: Secondary | ICD-10-CM | POA: Diagnosis present

## 2020-04-29 NOTE — Patient Instructions (Signed)

## 2020-04-29 NOTE — Therapy (Signed)
[facility] [address]  [place], [place], [address] Phone: [phone]   Fax:  [phone]  Physical Therapy Treatment  Patient Details  Name: Cheryl [name] MRN: [id number] Date of Birth: [birth year] Referring Provider (PT): Hulan Saas, MD   Encounter Date: 04/29/2020   PT End of Session - 04/29/20 1746    Visit Number 3    Number of Visits 7    Date for PT Re-Evaluation 05/12/20    Authorization Type [employer]et[employer]    PT Start Time 1713   pt late   PT Stop Time 1744    PT Time Calculation (min) 31 min    Activity Tolerance Patient tolerated treatment well;Patient limited by pain    Behavior During Therapy [facility] for tasks assessed/performed           Past Medical History:  Diagnosis Date  . Anxiety   . Bipolar 1 disorder ([place])   . Bipolar 2 disorder ([place])   . Depression   . Diabetes mellitus without complication ([place])   . Kidney stone   . Migraines    with aura  . OSA (obstructive sleep apnea) 10/17/2013  . Thyroid disease     Past Surgical History:  Procedure Laterality Date  . INTRAUTERINE DEVICE (IUD) INSERTION  10/2011   inserted & removed 2018  . NASAL SEPTUM SURGERY    . OTHER SURGICAL HISTORY     endometrial polyps (2)  . WISDOM TOOTH EXTRACTION      There were no vitals filed for this visit.   Subjective Assessment - 04/29/20 1714    Subjective Reports that she received an injection to the R SIJ last monday- notes improved pain but remianing "twitching" or "electrical buzz." Reports that she has not performed HEP since injection d/t being afraid to perform exercises after it. Will be receiving "acupuncture" or DN- not sure which, tomorrow at her Chiropractor's office.    Pertinent History thyroid disease, migraines, kidney stone, DM, depression, bipolar disorder, anxiety    Diagnostic tests 02/29/20 lumbar xray: Mild degenerative changes at the L5-S1 level.    Patient Stated Goals  decrease pain    Currently in Pain? No/denies                             [facility] Adult PT Treatment/Exercise - 04/29/20 0001      Lumbar Exercises: Stretches   Hip Flexor Stretch Right;Left;30 seconds;2 reps    Hip Flexor Stretch Limitations mod thomas with strap   cues to relax hip into extension    Other Lumbar Stretch Exercise R modified ER stretch 2x30"      Lumbar Exercises: Aerobic   Nustep Lvl 4, 5 min (LEs)      Lumbar Exercises: Supine   Bridge with [employer] 10 reps    Bridge with [employer] Limitations cues to contract glutes vs. HS    Bridge with clamshell 10 reps;3 seconds    Bridge with [employer] Limitations red TB at knees       Lumbar Exercises: Quadruped   Mad[place]/Old Horse 10 reps    Madcat/Old Horse Limitations to tolerance    Other Quadruped Lumbar Exercises thread the needle R/L 5x    good ROM, R SIJ pain with L rotation                 PT Education - 04/29/20 1745    Education Details verbal  review of HEP as well as education on increasing compliance with HEP for max benefit    Person(s) Educated Patient    Methods Explanation;Demonstration;Tactile cues;Verbal cues    Comprehension Verbalized understanding            PT Short Term Goals - 04/29/20 1750      PT SHORT TERM GOAL #1   Title Patient to be independent with initial HEP.    Time 3    Period Weeks    Status Achieved    Target Date 04/21/20             PT Long Term Goals - 04/08/20 1621      PT LONG TERM GOAL #1   Title Patient to be independent with advanced HEP.    Time 6    Period Weeks    Status On-going      PT LONG TERM GOAL #2   Title Patient to demonstrate lumbar AROM WFL and without pain limiting.    Time 6    Period Weeks    Status On-going      PT LONG TERM GOAL #3   Title Patient to report 75% improvement in tolerance for standing and sitting.    Time 6    Period Weeks    Status On-going      PT LONG TERM GOAL #4   Title  Patient to report tolerance for cleaning and household chores without limitations.    Time 6    Period Weeks    Status On-going      PT LONG TERM GOAL #5   Title Patient to ascend 13 stairs with 1 handrail with 0/10 pain and good hip stability.    Time 6    Period Weeks    Status On-going                 Plan - 04/29/20 1747    Clinical Impression Statement Patient noting that she received a R SIJ injection last Monday- noting improvement in pain but still noting "twitching" or "electrical buzz" sensation occasionally. Reports that she has not performed HEP since her injection d/t being afraid to perform exercises after it. Worked on gentle hip and lumbopelvic stretching/ROM to assess for movement tolerance. Patient demonstrated good ROM and tolerance, with exception of R SIJ pain with thread the needle with L UE, thus this was discontinued. Glute strengthening was performed with hip extension ROM, however provided cues to encourage glute vs. HS contraction to avoid HS pain. Worked on hip flexor stretching with patient demonstrating tightness in hip flexor, and requiring cues to avoid compensation. Provided verbal review of HEP as well as education on increasing compliance with HEP for max benefit. Patient reported understanding and without complaints at end of session.    Comorbidities thyroid disease, migraines, kidney stone, DM, depression, bipolar disorder, anxiety    Rehab Potential Good    PT Frequency 1x / week    PT Duration 6 weeks    PT Treatment/Interventions ADLs/Self Care Home Management;Cryotherapy;Electrical Stimulation;Moist Heat;Balance training;Therapeutic exercise;Therapeutic activities;Functional mobility training;Stair training;Gait training;Ultrasound;Neuromuscular re-education;Patient/family education;Manual techniques;Taping;Energy conservation;Dry needling;Passive range of motion    PT Next Visit Plan Assess spinal joint mobility; progress thoracolumbar/  lumbopelvic ROM    Consulted and Agree with Plan of Care Patient           Patient will benefit from skilled therapeutic intervention in order to improve the following deficits and impairments:  Decreased activity tolerance, Increased fascial restricitons, Decreased strength, Pain,  Difficulty walking, Improper body mechanics, Decreased range of motion, Postural dysfunction, Impaired flexibility  Visit Diagnosis: Chronic right-sided low back pain with right-sided sciatica  Abnormal posture  Other symptoms and signs involving the musculoskeletal system     Problem List Patient Active Problem List   Diagnosis Date Noted  . Pars defect of lumbar spine 02/28/2020  . Cigarette smoker 10/13/2017    Class: Chronic  . Cat bite of left hand including fingers with infection 04/13/2017  . Hyperlipidemia LDL goal <70 01/07/2017  . Breast mass 11/20/2013  . OSA (obstructive sleep apnea) 10/17/2013  . DM (diabetes mellitus) type II uncontrolled, periph vascular disorder ([place]) 01/30/2013  . RECTAL BLEEDING 09/10/2008  . KNEE PAIN, LEFT 04/09/2008  . BACK PAIN 04/09/2008  . Hypothyroidism 02/17/2007  . HIRSUTISM 02/01/2007  . ACNE NEC 02/01/2007  . Bipolar II disorder ([place]) 12/20/2006  . DERMATITIS, OTHER ATOPIC 12/20/2006     Janene Harvey, PT, DPT 04/29/20 5:51 PM   [facility] [address]  [place], [place], [address] Phone: [phone]   Fax:  [phone]  Name: Keelee Yankey MRN: [id number] Date of Birth: 01/16/1976

## 2020-05-13 ENCOUNTER — Other Ambulatory Visit: Payer: Self-pay

## 2020-05-13 ENCOUNTER — Ambulatory Visit: Payer: 59 | Admitting: Physical Therapy

## 2020-05-13 ENCOUNTER — Encounter: Payer: Self-pay | Admitting: Physical Therapy

## 2020-05-13 DIAGNOSIS — R293 Abnormal posture: Secondary | ICD-10-CM

## 2020-05-13 DIAGNOSIS — M5441 Lumbago with sciatica, right side: Secondary | ICD-10-CM | POA: Diagnosis not present

## 2020-05-13 DIAGNOSIS — R29898 Other symptoms and signs involving the musculoskeletal system: Secondary | ICD-10-CM

## 2020-05-13 DIAGNOSIS — G8929 Other chronic pain: Secondary | ICD-10-CM

## 2020-05-13 NOTE — Therapy (Signed)
[facility] [address]  [place], [place], [address] Phone: [phone]   Fax:  [phone]  Physical Therapy Treatment  Patient Details  Name: Cheryl [name] MRN: [id number] Date of Birth: February 09, 1976 Referring Provider (PT): Hulan Saas, MD   Encounter Date: 05/13/2020   PT End of Session - 05/13/20 1745    Visit Number 4    Number of Visits 7    Date for PT Re-Evaluation 05/12/20    Authorization Type Aetna    PT Start Time 1705   pt late   PT Stop Time 1744    PT Time Calculation (min) 39 min    Activity Tolerance Patient tolerated treatment well    Behavior During Therapy [facility] for tasks assessed/performed           Past Medical History:  Diagnosis Date  . Anxiety   . Bipolar 1 disorder ([place])   . Bipolar 2 disorder ([place])   . Depression   . Diabetes mellitus without complication ([place])   . Kidney stone   . Migraines    with aura  . OSA (obstructive sleep apnea) 10/17/2013  . Thyroid disease     Past Surgical History:  Procedure Laterality Date  . INTRAUTERINE DEVICE (IUD) INSERTION  10/2011   inserted & removed 2018  . NASAL SEPTUM SURGERY    . OTHER SURGICAL HISTORY     endometrial polyps (2)  . WISDOM TOOTH EXTRACTION      There were no vitals filed for this visit.   Subjective Assessment - 05/13/20 1707    Subjective Reports that her nerve pain is not as intense. Had dry needling/acupuncture done on her R hip/LB recently at chiropractor's office.    Pertinent History thyroid disease, migraines, kidney stone, DM, depression, bipolar disorder, anxiety    Diagnostic tests 02/29/20 lumbar xray: Mild degenerative changes at the L5-S1 level.    Patient Stated Goals decrease pain    Currently in Pain? No/denies              [facility] PT Assessment - 05/13/20 0001      Palpation   SI assessment  R PSIS elevated, R ASIS depressed   suggesting R anterior innomiate rotation                         [facility] Adult PT Treatment/Exercise - 05/13/20 0001      Lumbar Exercises: Aerobic   Recumbent Bike L1 x6 min      Lumbar Exercises: Supine   Bridge 10 reps    Bridge Limitations HS bridge    Bridge with [employer] 10 reps    Bridge with [employer] Limitations good ROM    Bridge with clamshell 10 reps;3 seconds    Bridge with [employer] Limitations red TB at knees     Other Supine Lumbar Exercises overhead yellow medball lift 2x10   cue to maintain posterior pelvic tilt     Manual Therapy   Manual Therapy Soft tissue mobilization;Myofascial release;Muscle Energy Technique    Manual therapy comments prone    Soft tissue mobilization STM to B proximal and lateral glutes, piriformis, lumbar paraspinals, QL   increased soft tissue restriction in L buttocks and B parasp   Myofascial Release manual TPR to L piriformis   good twitch response   Muscle Energy Technique MET to innitiate R posterior innominate rotation 5x5" followed by pelvic shotgun 3x5"  good tolerance                   PT Short Term Goals - 04/29/20 1750      PT SHORT TERM GOAL #1   Title Patient to be independent with initial HEP.    Time 3    Period Weeks    Status Achieved    Target Date 04/21/20             PT Long Term Goals - 04/08/20 1621      PT LONG TERM GOAL #1   Title Patient to be independent with advanced HEP.    Time 6    Period Weeks    Status On-going      PT LONG TERM GOAL #2   Title Patient to demonstrate lumbar AROM WFL and without pain limiting.    Time 6    Period Weeks    Status On-going      PT LONG TERM GOAL #3   Title Patient to report 75% improvement in tolerance for standing and sitting.    Time 6    Period Weeks    Status On-going      PT LONG TERM GOAL #4   Title Patient to report tolerance for cleaning and household chores without limitations.    Time 6    Period Weeks    Status On-going      PT LONG TERM GOAL #5    Title Patient to ascend 13 stairs with 1 handrail with 0/10 pain and good hip stability.    Time 6    Period Weeks    Status On-going                 Plan - 05/13/20 1746    Clinical Impression Statement Patient reporting deceased intensity of R LE nerve pain. Notes that she recently had dry needling/acupuncture done on her R hip/LB at her chiropractor's office, and anticipates having it done at this PT office next week. Assessed for soft tissue restriction, with patient demonstrating increased soft tissue restriction in L buttocks and B paraspinals/QL vs. R buttocks. Assessed pelvic alignment, with patient demonstrating elevated R PSIS and depressed ASIS, suggesting R anterior innominate rotation. Addressed this with MET to promote posterior torsion, which patient tolerated well and demonstrated symmetrical pelvic alignment after MT. Proceeded with progressive core and hip strengthening with good tolerance. Patient without complaints at end of session.    Comorbidities thyroid disease, migraines, kidney stone, DM, depression, bipolar disorder, anxiety    Rehab Potential Good    PT Frequency 1x / week    PT Duration 6 weeks    PT Treatment/Interventions ADLs/Self Care Home Management;Cryotherapy;Electrical Stimulation;Moist Heat;Balance training;Therapeutic exercise;Therapeutic activities;Functional mobility training;Stair training;Gait training;Ultrasound;Neuromuscular re-education;Patient/family education;Manual techniques;Taping;Energy conservation;Dry needling;Passive range of motion    PT Next Visit Plan DN to L piriformis, glutes, and B lumbar paraspinals/QL; progress thoracolumbar/ lumbopelvic ROM    Consulted and Agree with Plan of Care Patient           Patient will benefit from skilled therapeutic intervention in order to improve the following deficits and impairments:  Decreased activity tolerance, Increased fascial restricitons, Decreased strength, Pain, Difficulty walking,  Improper body mechanics, Decreased range of motion, Postural dysfunction, Impaired flexibility  Visit Diagnosis: Chronic right-sided low back pain with right-sided sciatica  Abnormal posture  Other symptoms and signs involving the musculoskeletal system     Problem List Patient Active Problem List   Diagnosis Date Noted  .  Pars defect of lumbar spine 02/28/2020  . Cigarette smoker 10/13/2017    Class: Chronic  . Cat bite of left hand including fingers with infection 04/13/2017  . Hyperlipidemia LDL goal <70 01/07/2017  . Breast mass 11/20/2013  . OSA (obstructive sleep apnea) 10/17/2013  . DM (diabetes mellitus) type II uncontrolled, periph vascular disorder ([place]) 01/30/2013  . RECTAL BLEEDING 09/10/2008  . KNEE PAIN, LEFT 04/09/2008  . BACK PAIN 04/09/2008  . Hypothyroidism 02/17/2007  . HIRSUTISM 02/01/2007  . ACNE NEC 02/01/2007  . Bipolar II disorder ([place]) 12/20/2006  . DERMATITIS, OTHER ATOPIC 12/20/2006     Janene Harvey, PT, DPT 05/13/20 5:51 PM   [facility] [address]  [place], [place], [address] Phone: [phone]   Fax:  [phone]  Name: Cheryl [name] MRN: [id number] Date of Birth: [birth year]

## 2020-05-20 ENCOUNTER — Other Ambulatory Visit: Payer: Self-pay

## 2020-05-20 ENCOUNTER — Other Ambulatory Visit: Payer: Self-pay | Admitting: *Deleted

## 2020-05-20 ENCOUNTER — Encounter: Payer: Self-pay | Admitting: Physical Therapy

## 2020-05-20 ENCOUNTER — Ambulatory Visit: Payer: 59 | Admitting: Physical Therapy

## 2020-05-20 DIAGNOSIS — R293 Abnormal posture: Secondary | ICD-10-CM

## 2020-05-20 DIAGNOSIS — G8929 Other chronic pain: Secondary | ICD-10-CM

## 2020-05-20 DIAGNOSIS — M5441 Lumbago with sciatica, right side: Secondary | ICD-10-CM | POA: Diagnosis not present

## 2020-05-20 DIAGNOSIS — R29898 Other symptoms and signs involving the musculoskeletal system: Secondary | ICD-10-CM

## 2020-05-20 MED ORDER — LEVOTHYROXINE SODIUM 175 MCG PO TABS
ORAL_TABLET | ORAL | 0 refills | Status: DC
Start: 2020-05-20 — End: 2020-06-24

## 2020-05-20 NOTE — Therapy (Addendum)
[facility] [address]  [place], [place], [address] Phone: [phone]   Fax:  [phone]  Physical Therapy Treatment  Patient Details  Name: Cheryl [name] MRN: [id number] Date of Birth: 07/22/1975 Referring Provider (PT): Hulan Saas, MD   Encounter Date: 05/20/2020   PT End of Session - 05/20/20 1530    Visit Number 5    Number of Visits 7    Date for PT Re-Evaluation 05/12/20    Authorization Type [employer]    PT Start Time 1530    PT Stop Time 1616    PT Time Calculation (min) 46 min    Activity Tolerance Patient tolerated treatment well    Behavior During Therapy [facility] for tasks assessed/performed           Past Medical History:  Diagnosis Date  . Anxiety   . Bipolar 1 disorder ([place])   . Bipolar 2 disorder ([place])   . Depression   . Diabetes mellitus without complication ([place])   . Kidney stone   . Migraines    with aura  . OSA (obstructive sleep apnea) 10/17/2013  . Thyroid disease     Past Surgical History:  Procedure Laterality Date  . INTRAUTERINE DEVICE (IUD) INSERTION  10/2011   inserted & removed 2018  . NASAL SEPTUM SURGERY    . OTHER SURGICAL HISTORY     endometrial polyps (2)  . WISDOM TOOTH EXTRACTION      There were no vitals filed for this visit.   Subjective Assessment - 05/20/20 1534    Subjective Pt reports her pain "is not terrible today". Her biggest problem is the nerve shooting down her leg at night.    Pertinent History thyroid disease, migraines, kidney stone, DM, depression, bipolar disorder, anxiety    Diagnostic tests 02/29/20 lumbar xray: Mild degenerative changes at the L5-S1 level.    Patient Stated Goals decrease pain    Currently in Pain? Yes    Pain Score 4     Pain Location Sacrum    Pain Orientation Right    Pain Descriptors / Indicators Sharp   "annoying"   Pain Type Chronic pain    Pain Frequency Constant                              [facility] Adult PT Treatment/Exercise - 05/20/20 1530      Lumbar Exercises: Aerobic   Recumbent Bike L4 x 6 min   new bike     Manual Therapy   Manual Therapy Soft tissue mobilization;Myofascial release    Manual therapy comments skilled palpation and monitoring during DN    Soft tissue mobilization STM/DTM to B proximal and lateral glutes, piriformis, lumbar paraspinals, QL    Myofascial Release manual TPR to B glute med/min & piriformis; pin & stretch to B lumbar paraspinals            Trigger Point Dry Needling - 05/20/20 1530    Consent Given? Yes    Education Handout Provided Previously provided   last visit   Muscles Treated Back/Hip Gluteus minimus;Gluteus medius;Piriformis;Erector spinae;Lumbar multifidi;Quadratus lumborum   Bilateral   Gluteus Minimus Response Twitch response elicited;Palpable increased muscle length    Gluteus Medius Response Twitch response elicited;Palpable increased muscle length    Piriformis Response Twitch response elicited;Palpable increased muscle length    Erector spinae Response Twitch response elicited;Palpable increased muscle length    Lumbar  multifidi Response Twitch response elicited;Palpable increased muscle length    Quadratus Lumborum Response Twitch response elicited;Palpable increased muscle length                PT Education - 05/20/20 1540    Education Details DN rational, procedures, outcomes, potential side effects, and recommended post-treatment exercises/activity    Person(s) Educated Patient    Methods Explanation;Handout    Comprehension Verbalized understanding            PT Short Term Goals - 04/29/20 1750      PT SHORT TERM GOAL #1   Title Patient to be independent with initial HEP.    Time 3    Period Weeks    Status Achieved    Target Date 04/21/20             PT Long Term Goals - 04/08/20 1621      PT LONG TERM GOAL #1   Title Patient to be independent with  advanced HEP.    Time 6    Period Weeks    Status On-going      PT LONG TERM GOAL #2   Title Patient to demonstrate lumbar AROM WFL and without pain limiting.    Time 6    Period Weeks    Status On-going      PT LONG TERM GOAL #3   Title Patient to report 75% improvement in tolerance for standing and sitting.    Time 6    Period Weeks    Status On-going      PT LONG TERM GOAL #4   Title Patient to report tolerance for cleaning and household chores without limitations.    Time 6    Period Weeks    Status On-going      PT LONG TERM GOAL #5   Title Patient to ascend 13 stairs with 1 handrail with 0/10 pain and good hip stability.    Time 6    Period Weeks    Status On-going                 Plan - 05/20/20 1536    Clinical Impression Statement Otila Kluver denies significant pain today but states her biggest problem is the nerve pain shooting down her R leg at night and is hopeful that DN may help with this. After explanation of DN rational, procedures, outcomes and potential side effects, patient verbalized consent to DN treatment in conjunction with manual STM/DTM and TPR to reduce ttp/muscle tension. Muscles treated include B QL, lumbar erector spinae, lower lumbar multifidi, glute medius/minimus and piriformis. DN produced normal response with good twitches elicited resulting in palpable reduction in pain/ttp and muscle tension, although pt flinching/jumping with multiple attempts of DN to glutes and piriformis. Pt educated to expect mild to moderate muscle soreness for up to 24-48 hrs and instructed to continue prescribed home exercise program and current activity level with pt verbalizing understanding of theses instructions.    Comorbidities thyroid disease, migraines, kidney stone, DM, depression, bipolar disorder, anxiety    Rehab Potential Good    PT Frequency 1x / week    PT Duration 6 weeks    PT Treatment/Interventions ADLs/Self Care Home Management;Cryotherapy;Electrical  Stimulation;Moist Heat;Balance training;Therapeutic exercise;Therapeutic activities;Functional mobility training;Stair training;Gait training;Ultrasound;Neuromuscular re-education;Patient/family education;Manual techniques;Taping;Energy conservation;Dry needling;Passive range of motion    PT Next Visit Plan assess response to DN to B piriformis, glutes, and lumbar paraspinals/QL; progress thoracolumbar/ lumbopelvic ROM    Consulted and Agree with Plan of  Care Patient           Patient will benefit from skilled therapeutic intervention in order to improve the following deficits and impairments:  Decreased activity tolerance, Increased fascial restricitons, Decreased strength, Pain, Difficulty walking, Improper body mechanics, Decreased range of motion, Postural dysfunction, Impaired flexibility  Visit Diagnosis: Chronic right-sided low back pain with right-sided sciatica  Abnormal posture  Other symptoms and signs involving the musculoskeletal system     Problem List Patient Active Problem List   Diagnosis Date Noted  . Pars defect of lumbar spine 02/28/2020  . Cigarette smoker 10/13/2017    Class: Chronic  . Cat bite of left hand including fingers with infection 04/13/2017  . Hyperlipidemia LDL goal <70 01/07/2017  . Breast mass 11/20/2013  . OSA (obstructive sleep apnea) 10/17/2013  . DM (diabetes mellitus) type II uncontrolled, periph vascular disorder ([place]) 01/30/2013  . RECTAL BLEEDING 09/10/2008  . KNEE PAIN, LEFT 04/09/2008  . BACK PAIN 04/09/2008  . Hypothyroidism 02/17/2007  . HIRSUTISM 02/01/2007  . ACNE NEC 02/01/2007  . Bipolar II disorder ([place]) 12/20/2006  . DERMATITIS, OTHER ATOPIC 12/20/2006    Percival Spanish, PT, MPT 05/20/2020, 6:46 PM  [facility] [address]  [place], [place], [address] Phone: [phone]   Fax:  [phone]  Name: Ayslin Kundert MRN: [id number] Date of  Birth: 27-Dec-1975   PHYSICAL THERAPY DISCHARGE SUMMARY  Visits from Start of Care: 5  Current functional level related to goals / functional outcomes: Unable to assess; patient did not return   Remaining deficits: Unable to assess   Education / Equipment: HEP  Plan: Patient agrees to discharge.  Patient goals were not met. Patient is being discharged due to not returning since the last visit.  ?????     Janene Harvey, PT, DPT 06/23/20 11:47 AM

## 2020-06-02 ENCOUNTER — Encounter (HOSPITAL_COMMUNITY): Payer: Self-pay | Admitting: Psychiatry

## 2020-06-02 ENCOUNTER — Other Ambulatory Visit: Payer: Self-pay

## 2020-06-02 ENCOUNTER — Telehealth (INDEPENDENT_AMBULATORY_CARE_PROVIDER_SITE_OTHER): Payer: 59 | Admitting: Psychiatry

## 2020-06-02 DIAGNOSIS — F3181 Bipolar II disorder: Secondary | ICD-10-CM

## 2020-06-02 DIAGNOSIS — F419 Anxiety disorder, unspecified: Secondary | ICD-10-CM

## 2020-06-02 MED ORDER — SERTRALINE HCL 100 MG PO TABS: 200.0000 mg | ORAL_TABLET | Freq: Every day | ORAL | 2 refills | Status: DC

## 2020-06-02 MED ORDER — LAMOTRIGINE 150 MG PO TABS: 150.0000 mg | ORAL_TABLET | Freq: Two times a day (BID) | ORAL | 2 refills | Status: DC

## 2020-06-02 NOTE — Progress Notes (Signed)
Virtual Visit via Telephone Note  I connected with Cheryl [name] on 06/02/20 at 11:20 AM EST by telephone and verified that I am speaking with the correct person using two identifiers.  Location: Patient: home Provider: home office   I discussed the limitations, risks, security and privacy concerns of performing an evaluation and management service by telephone and the availability of in person appointments. I also discussed with the patient that there may be a patient responsible charge related to this service. The patient expressed understanding and agreed to proceed.   History of Present Illness: Patient is evaluated by phone session.  She is on the phone by herself.  She is taking her medication as prescribed.  She admitted lately more concerned about her leg pain.  There are nights when she struggle with sleep because of the pain.  2 days ago she had been very tired and decided to take her home test for COVID which was slightly positive.  She now had a PCR and waiting for test results from the laboratory.  She is working from home.  She is feeling fine.  She denies any cough, fever, shivering.  She denies any mania, psychosis, hallucination.  Regular Will's okay.  She has no rash, itching tremors or shakes.  She is taking gabapentin which is helping but is still have episodic back pain.  She denies crying spells.  She lives with her husband who is supportive.  Past Psychiatric History: [facility] TriedProzac, wellbutrinand Effexor. Didnot like Prozac andwithdrawal from Effexor. H/Omania, anger and mood swings. No h/oinpatient treatment, suicidal attempt. Seen at [facility] tried psychiatry but not happy with the services.   Psychiatric Specialty Exam: Physical Exam  Review of Systems  Weight 230 lb (104.3 kg).There is no height or weight on file to calculate BMI.  General Appearance: NA  Eye Contact:  NA  Speech:  Clear and  Coherent  Volume:  Normal  Mood:  Euthymic  Affect:  NA  Thought Process:  Goal Directed  Orientation:  Full (Time, Place, and Person)  Thought Content:  WDL  Suicidal Thoughts:  No  Homicidal Thoughts:  No  Memory:  Immediate;   Good Recent;   Good Remote;   Good  Judgement:  Intact  Insight:  Present  Psychomotor Activity:  NA  Concentration:  Concentration: Good and Attention Span: Good  Recall:  Good  Fund of Knowledge:  Good  Language:  Good  Akathisia:  No  Handed:  Right  AIMS (if indicated):     Assets:  Communication Skills Desire for Improvement Housing Transportation  ADL's:  Intact  Cognition:  WNL  Sleep:   ok      Assessment and Plan: Bipolar disorder type II.  Anxiety.  Patient has episodic insomnia which she believes due to her back and leg pain.  Recently she was feeling tired and had a home COVID test which was slightly positive and now she waiting for PCR results from the laboratory.  Her job is going well.  She like to keep the current medication.  Continue Lamictal 150 mg twice a day and Zoloft 200 mg daily.  She has not taken Klonopin in more than 6 months.  She does not need a new prescription.  Discussed medication side effects and benefits.  Recommended to call us back if she is any question or any concern.  Follow-up in 3 months.     Follow Up Instructions:    I discussed the assessment  and treatment plan with the patient. The patient was provided an opportunity to ask questions and all were answered. The patient agreed with the plan and demonstrated an understanding of the instructions.   The patient was advised to call back or seek an in-person evaluation if the symptoms worsen or if the condition fails to improve as anticipated.  I provided 17 minutes of non-face-to-face time during this encounter.   Kathlee Nations, MD

## 2020-06-15 ENCOUNTER — Encounter: Payer: Self-pay | Admitting: Family Medicine

## 2020-06-17 ENCOUNTER — Other Ambulatory Visit: Payer: Self-pay

## 2020-06-17 DIAGNOSIS — M4306 Spondylolysis, lumbar region: Secondary | ICD-10-CM

## 2020-06-24 ENCOUNTER — Other Ambulatory Visit: Payer: Self-pay | Admitting: Family Medicine

## 2020-06-24 DIAGNOSIS — E1165 Type 2 diabetes mellitus with hyperglycemia: Secondary | ICD-10-CM

## 2020-07-11 ENCOUNTER — Other Ambulatory Visit: Payer: 59

## 2020-07-20 ENCOUNTER — Ambulatory Visit
Admission: RE | Admit: 2020-07-20 | Discharge: 2020-07-20 | Disposition: A | Discharge status: Home / Self Care | Payer: BC Managed Care – PPO | Source: Ambulatory Visit | Attending: Family Medicine | Admitting: Family Medicine

## 2020-07-20 DIAGNOSIS — M4306 Spondylolysis, lumbar region: Secondary | ICD-10-CM

## 2020-07-20 IMAGING — MR MR LUMBAR SPINE W/O CM
4 of 5 series · 19 of 48 positions shown · non-contrast
Comparison: Lumbar radiography [DATE]

CLINICAL DATA: Right leg pain extending to the foot

EXAM:
MRI LUMBAR SPINE WITHOUT CONTRAST
TECHNIQUE: Multiplanar, multisequence MR imaging of the lumbar spine was
performed. No intravenous contrast was administered.

[Series 5: T2 · sagittal · 4.0mm · 0.73mm/px · 6 of 17 slices shown (1 of 2)]
[im 1/17]
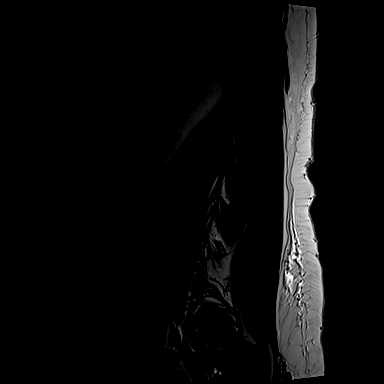
[im 4/17]
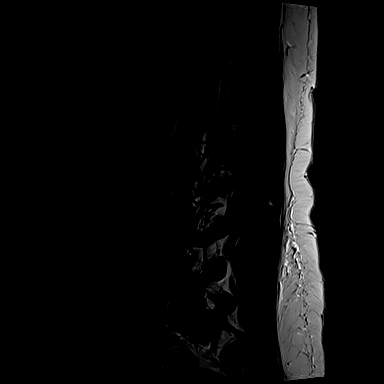
[im 7/17]
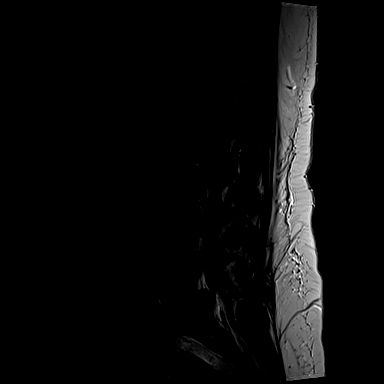
[im 10/17]
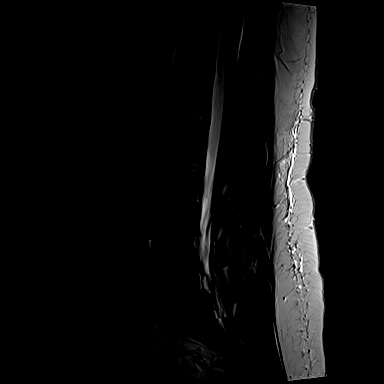
[im 13/17]
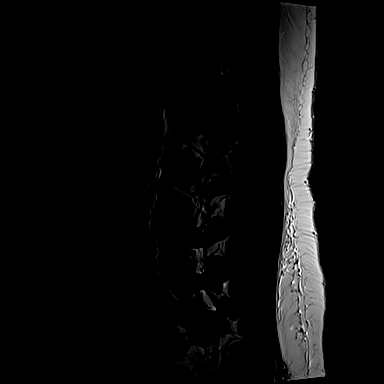
[im 17/17]
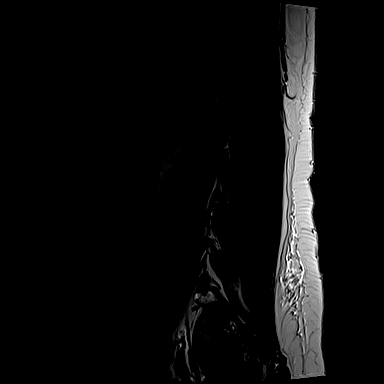

[Series 6: T1 · sagittal · 4.0mm · 0.73mm/px · 3 of 17 slices shown (1 of 2)]
[im 1/17]
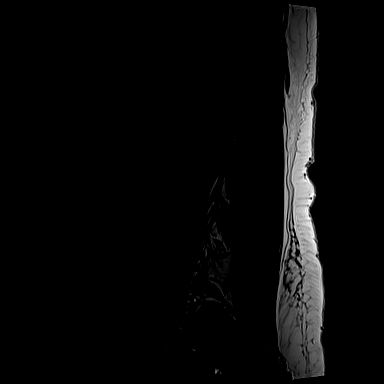
[im 9/17]
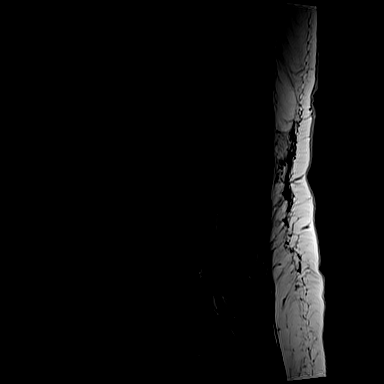
[im 17/17]
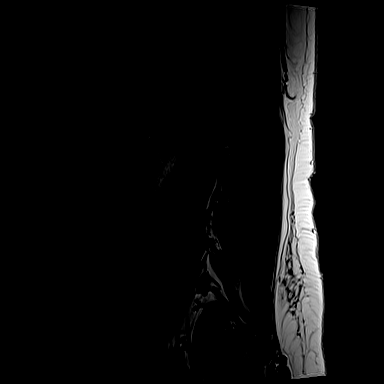

[Series 12: T2 · axial · 4.0mm · 0.28mm/px · z∈[-121,+73]mm · 7 of 49 slices shown (2 of 2)]
[im 4/49]
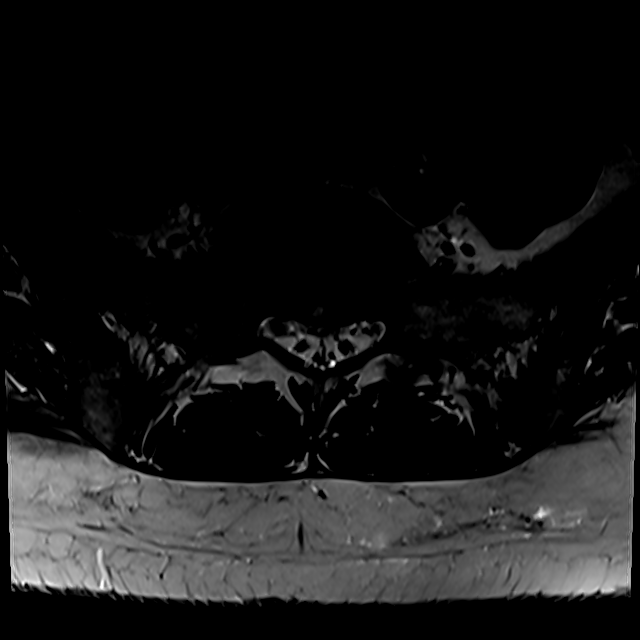
[im 7/49]
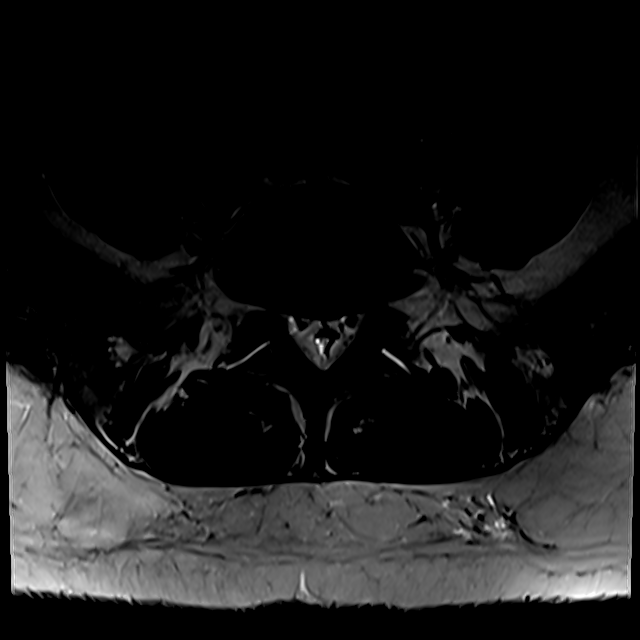
[im 10/49]
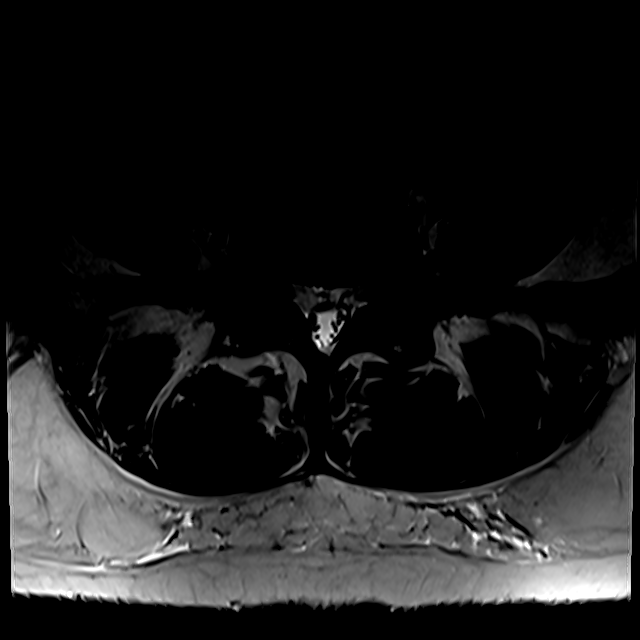
[im 17/49]
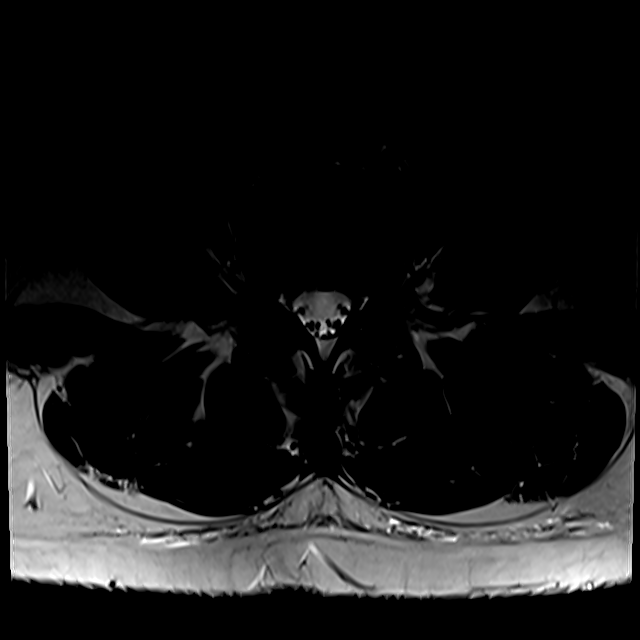
[im 23/49]
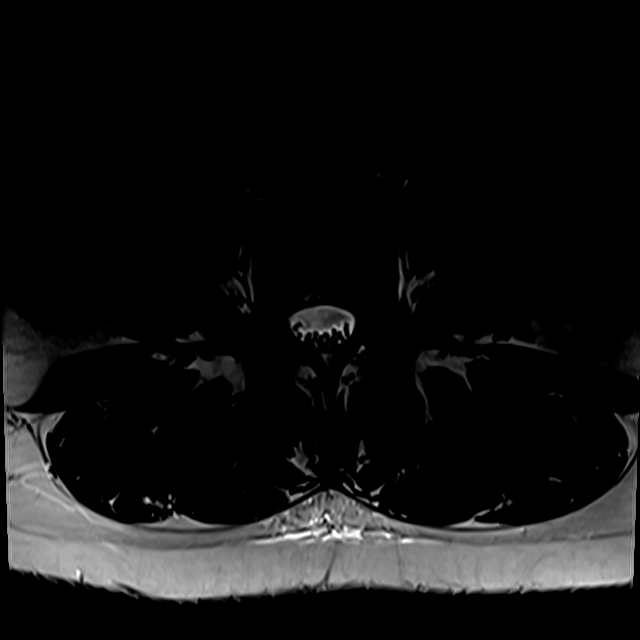
[im 26/49]
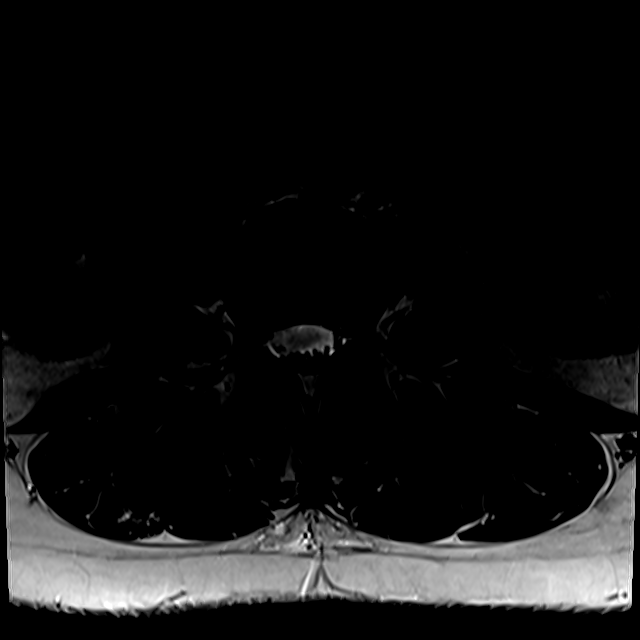
[im 42/49]
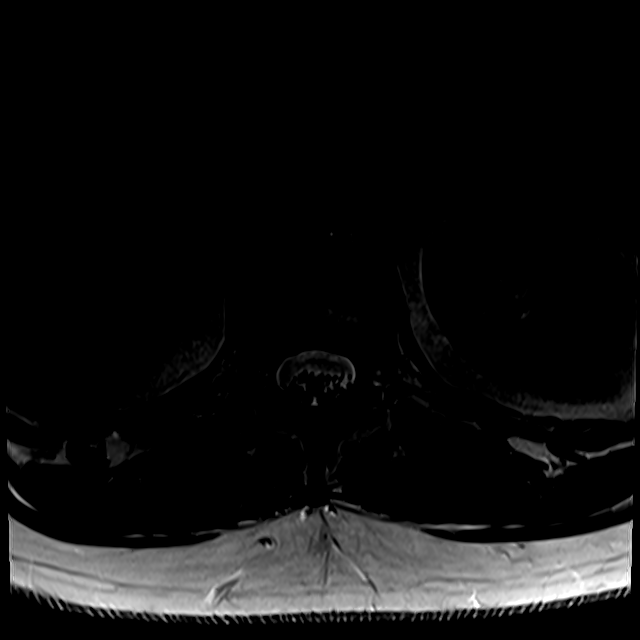

[Series 100: T1 · axial · 4.0mm · 0.28mm/px · z∈[-106,+73]mm · 3 of 49 slices shown (2 of 2)]
[im 7/49]
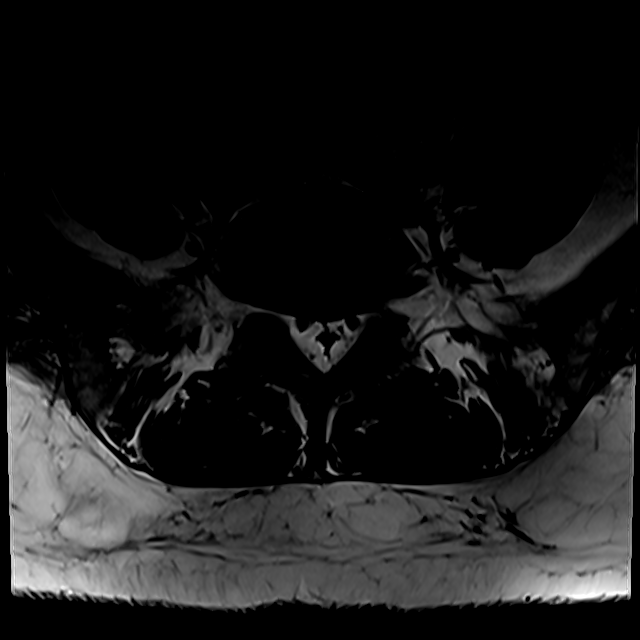
[im 26/49]
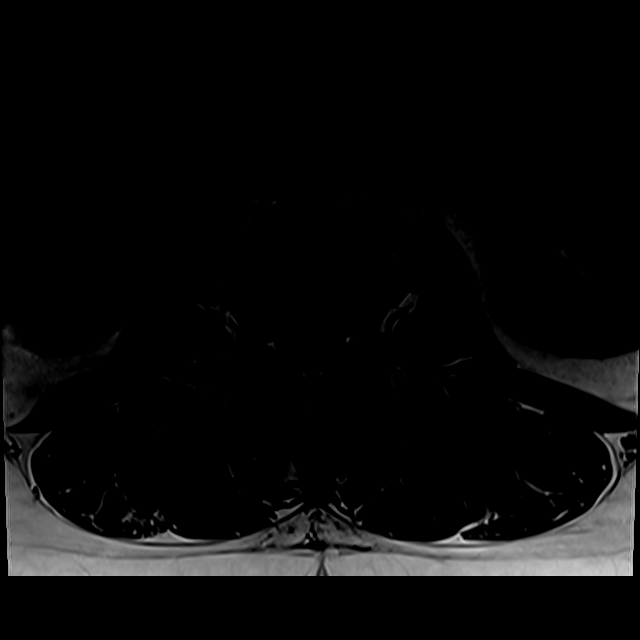
[im 42/49]
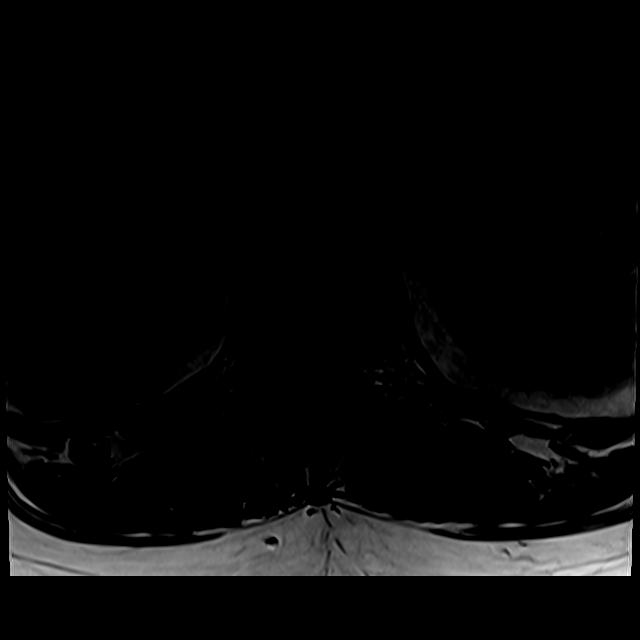

[19 of 48 positions shown; findings below may reference images not displayed]

FINDINGS: Segmentation:  5 lumbar type vertebrae

Alignment:  Grade 1 anterolisthesis at L5-S1.

Vertebrae: Chronic bilateral pars defects at L5, best demonstrated
by [[facility]] CT. No fracture, discitis, or aggressive bone lesion

Conus medullaris and cauda equina: Conus extends to the T12-L1
level. Conus and cauda equina appear normal.

Paraspinal and other soft tissues: No perispinal mass or
inflammation.

Disc levels:

L5-S1 focal disc desiccation and narrowing with anterolisthesis and
central to right foraminal protrusion. Right foraminal stenosis with
L5 root flattening on sagittal images.
IMPRESSION: L5 chronic bilateral pars defects with L5-S1 anterolisthesis and
focal disc degeneration. Degenerative changes including protrusion
causes impingement of the right L5 foraminal nerve root.

## 2020-07-20 IMAGING — MR MR PELVIS W/O CM
4 of 5 series · 27 of 48 positions shown · non-contrast
Comparison: CT pelvis [DATE]

CLINICAL DATA: Low back and pelvic pain

EXAM:
MRI PELVIS WITHOUT CONTRAST
TECHNIQUE: Multiplanar multisequence MR imaging of the pelvis was performed. No
intravenous contrast was administered.

[Series 3: STIR · coronal · right · 3.0mm · 1.25mm/px · 8 of 50 slices shown]
[im 1/50]
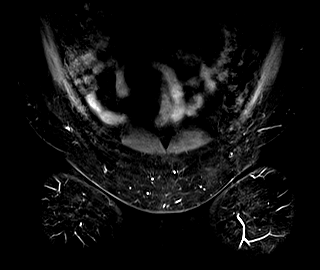
[im 6/50]
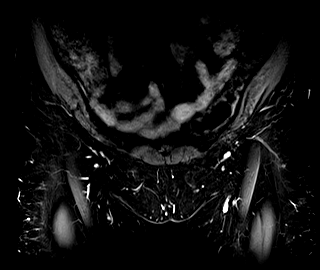
[im 17/50]
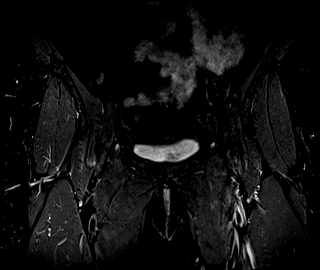
[im 22/50]
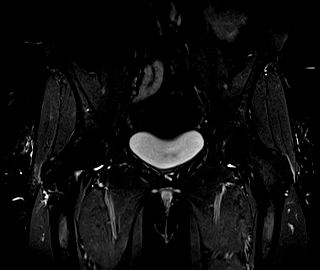
[im 28/50]
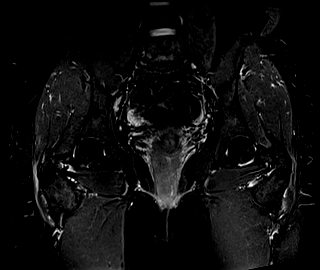
[im 33/50]
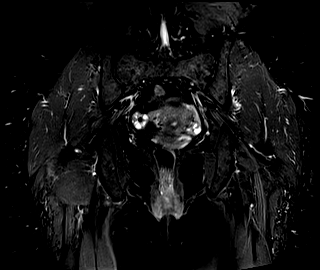
[im 44/50]
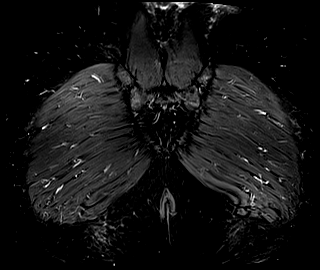
[im 50/50]
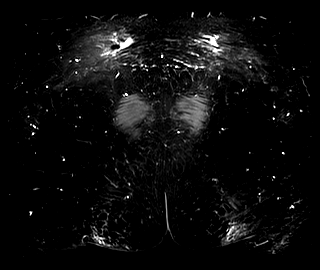

[Series 4: T1 · coronal · right · 3.0mm · 0.78mm/px · 11 of 50 slices shown (1 of 2)]
[im 1/50]
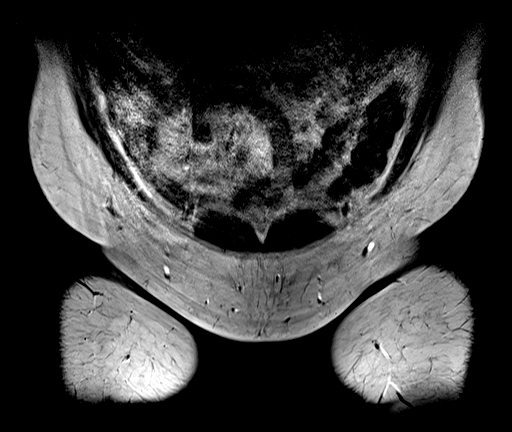
[im 5/50]
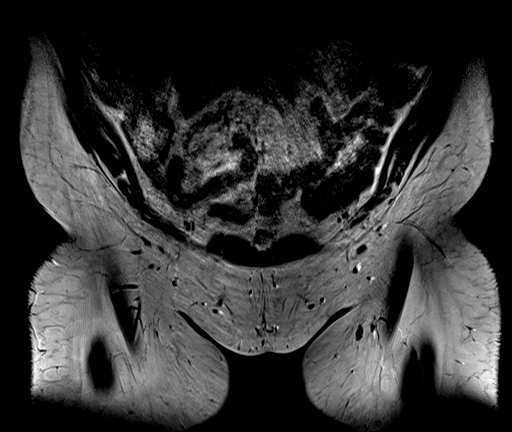
[im 10/50]
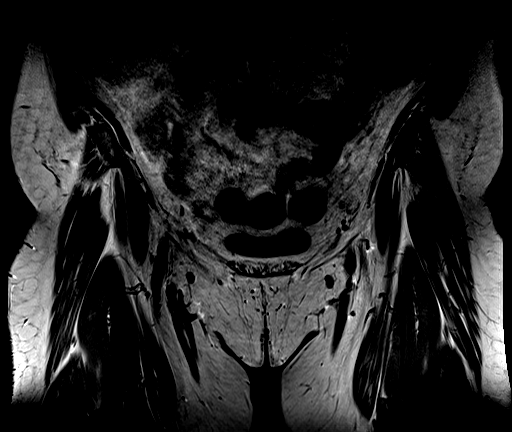
[im 15/50]
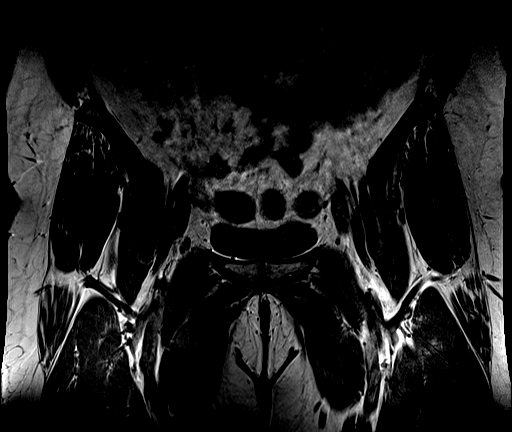
[im 20/50]
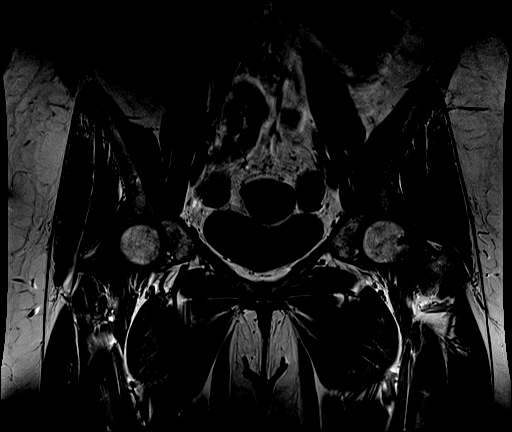
[im 25/50]
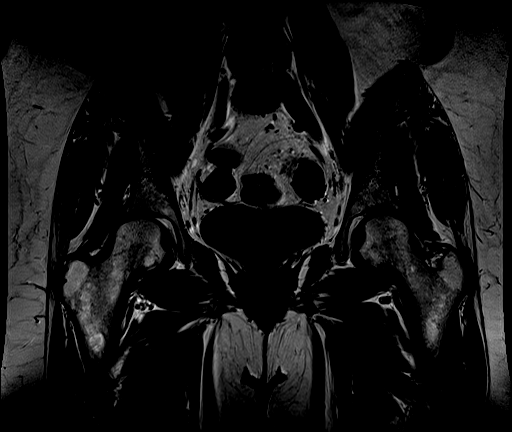
[im 30/50]
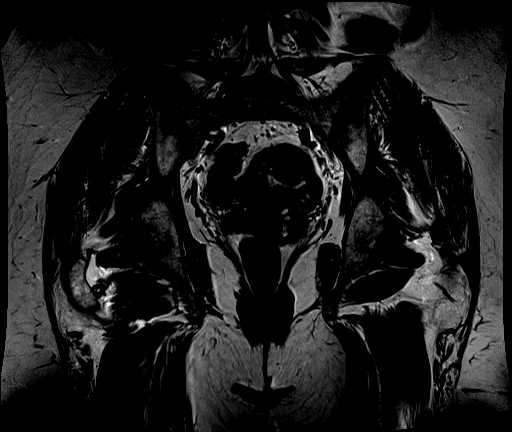
[im 35/50]
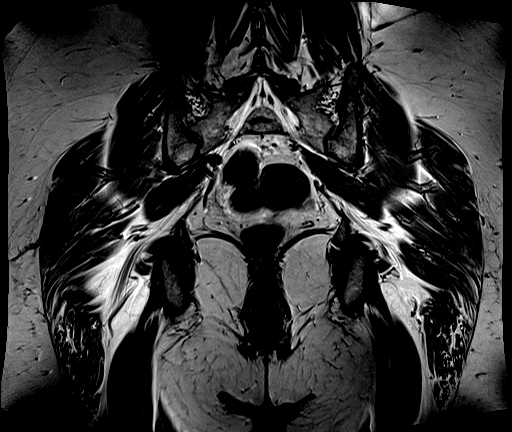
[im 40/50]
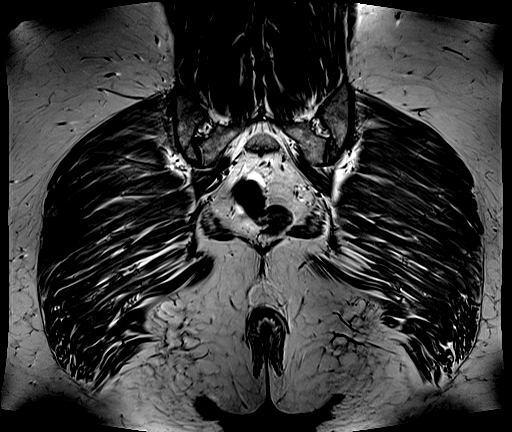
[im 45/50]
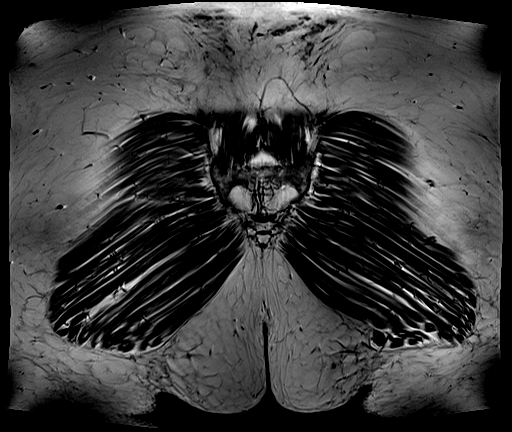
[im 50/50]
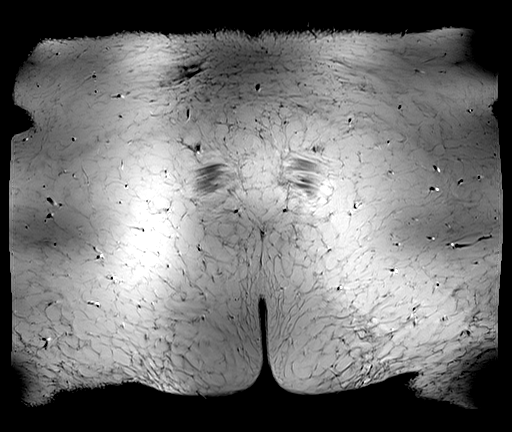

[Series 6: T2 fat-sat · axial · right · 5.0mm · 0.78mm/px · z∈[-80,+123]mm · 5 of 30 slices shown]
[im 1/30]
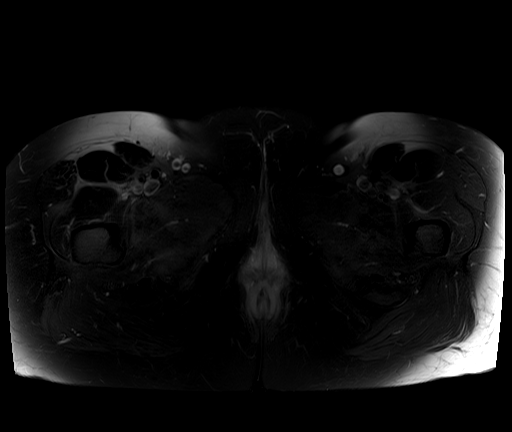
[im 6/30]
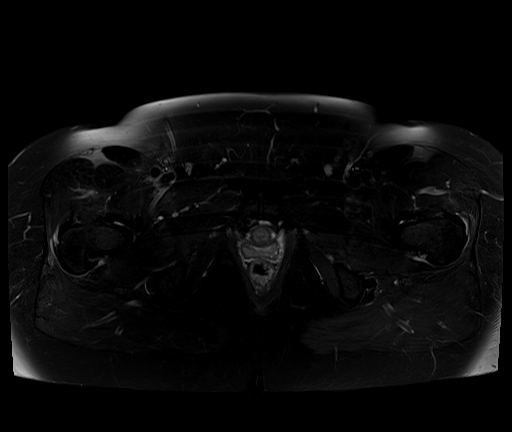
[im 12/30]
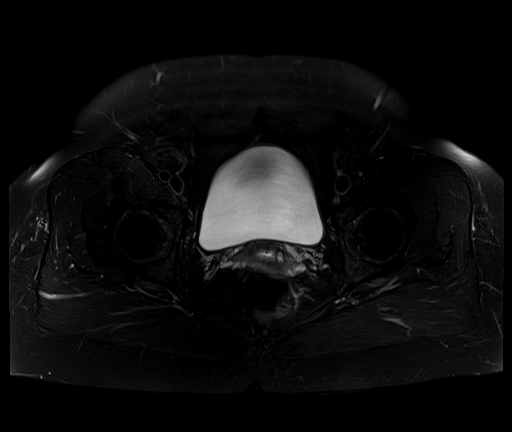
[im 18/30]
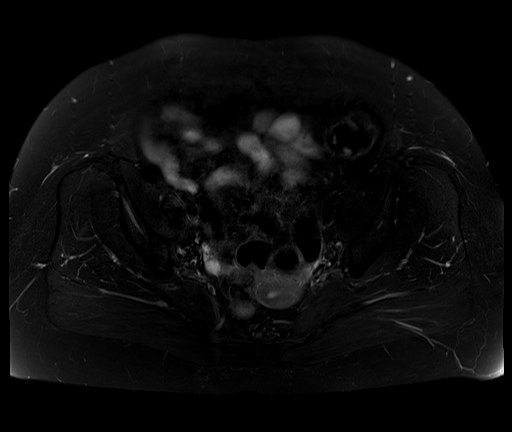
[im 30/30]
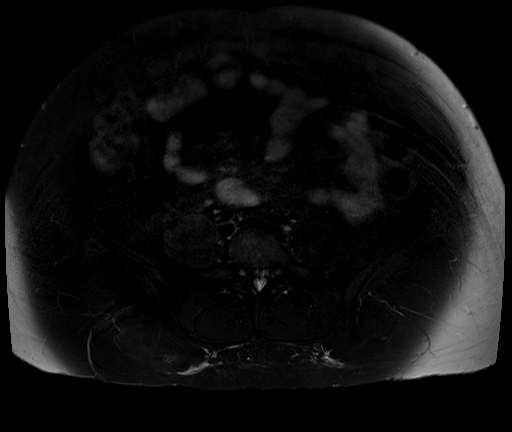

[Series 7: T1 · axial · right · 5.0mm · 0.78mm/px · z∈[-105,+140]mm · 3 of 45 slices shown (2 of 2)]
[im 5/45]
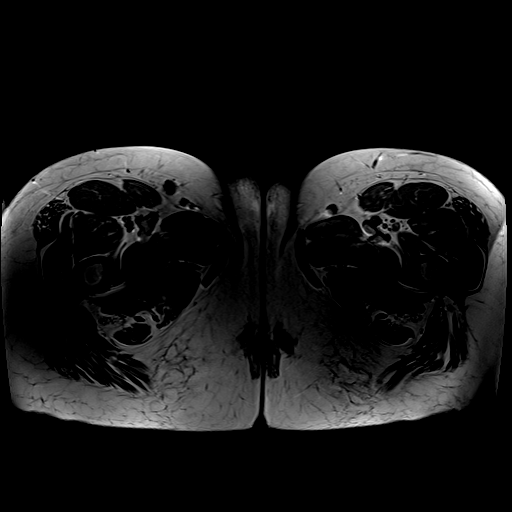
[im 25/45]
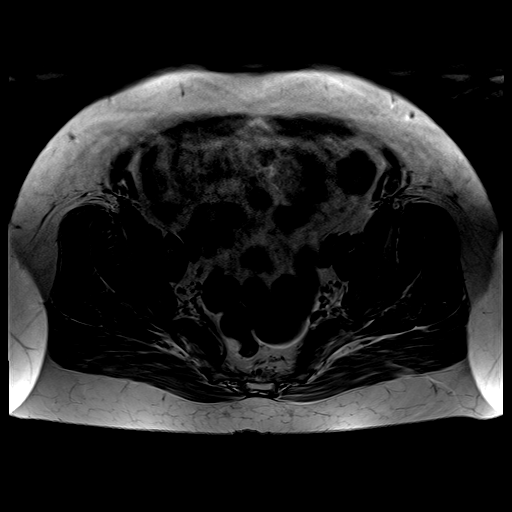
[im 40/45]
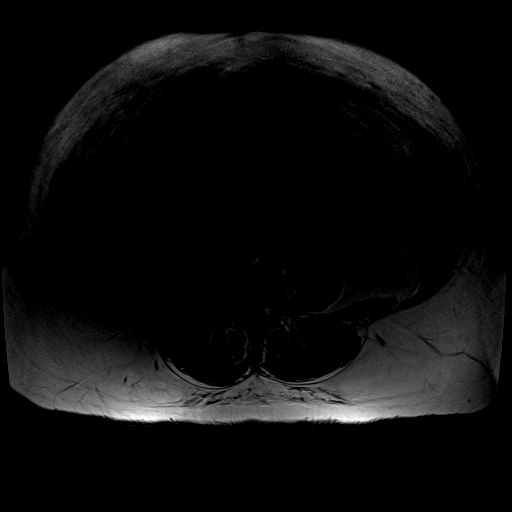

[27 of 48 positions shown; findings below may reference images not displayed]

FINDINGS: Urinary Tract:  Unremarkable

Bowel:  Unremarkable

Vascular/Lymphatic: Unremarkable

Reproductive: Mildly retroverted uterus. The ovaries appear
unremarkable.

Other:  No supplemental non-categorized findings.

Musculoskeletal: No abnormal osseous edema signal or abnormal T1
signal in the bony structures to suggest a stress fracture. The
sacrum appears unremarkable as do the SI joints. No hip effusion.
Trace right trochanteric bursitis noted, image 24 series 3.

Regional musculotendinous structures appear normal. No impingement
along the sacral plexus or sciatic notch region.
IMPRESSION: 1. Trace right trochanteric bursitis.
2. No significant bony abnormality in the pelvis.

## 2020-07-21 ENCOUNTER — Encounter: Payer: Self-pay | Admitting: Family Medicine

## 2020-07-22 ENCOUNTER — Other Ambulatory Visit: Payer: Self-pay

## 2020-07-22 DIAGNOSIS — G8929 Other chronic pain: Secondary | ICD-10-CM

## 2020-07-26 ENCOUNTER — Other Ambulatory Visit: Payer: Self-pay | Admitting: Family Medicine

## 2020-07-30 ENCOUNTER — Inpatient Hospital Stay: Admission: RE | Admit: 2020-07-30 | Payer: BC Managed Care – PPO | Source: Ambulatory Visit

## 2020-08-04 ENCOUNTER — Telehealth: Payer: Self-pay | Admitting: Family Medicine

## 2020-08-04 MED ORDER — LEVOTHYROXINE SODIUM 175 MCG PO TABS
175.0000 ug | ORAL_TABLET | Freq: Every day | ORAL | 0 refills | Status: DC
Start: 2020-08-04 — End: 2020-09-05

## 2020-08-04 NOTE — Telephone Encounter (Signed)
Refill sent.

## 2020-08-04 NOTE — Telephone Encounter (Signed)
Patient has an appt on 08/12/20 would like some medication   levothyroxine (SYNTHROID) 175 MCG tablet [Pharmacy Med Name: LEVOTHYROXINE SODIUM 175 MCG TAB] [[id number]]     [facility] [place] - [address]  [address], [place] [address]  Phone:  [phone] Fax:  [phone]

## 2020-08-12 ENCOUNTER — Ambulatory Visit: Payer: BC Managed Care – PPO | Admitting: Family Medicine

## 2020-08-12 DIAGNOSIS — Z0289 Encounter for other administrative examinations: Secondary | ICD-10-CM

## 2020-08-25 ENCOUNTER — Other Ambulatory Visit: Payer: Self-pay | Admitting: Family Medicine

## 2020-08-25 ENCOUNTER — Other Ambulatory Visit (HOSPITAL_COMMUNITY): Payer: Self-pay | Admitting: Psychiatry

## 2020-08-25 DIAGNOSIS — F419 Anxiety disorder, unspecified: Secondary | ICD-10-CM

## 2020-08-25 DIAGNOSIS — E039 Hypothyroidism, unspecified: Secondary | ICD-10-CM

## 2020-08-25 DIAGNOSIS — F3181 Bipolar II disorder: Secondary | ICD-10-CM

## 2020-08-28 ENCOUNTER — Telehealth (HOSPITAL_COMMUNITY): Payer: BC Managed Care – PPO | Admitting: Psychiatry

## 2020-08-28 ENCOUNTER — Other Ambulatory Visit: Payer: Self-pay

## 2020-09-01 ENCOUNTER — Other Ambulatory Visit: Payer: Self-pay

## 2020-09-01 ENCOUNTER — Ambulatory Visit
Admission: RE | Admit: 2020-09-01 | Discharge: 2020-09-01 | Disposition: A | Discharge status: Home / Self Care | Payer: BC Managed Care – PPO | Source: Ambulatory Visit | Attending: Family Medicine | Admitting: Family Medicine

## 2020-09-01 DIAGNOSIS — G8929 Other chronic pain: Secondary | ICD-10-CM

## 2020-09-01 DIAGNOSIS — M545 Low back pain, unspecified: Secondary | ICD-10-CM

## 2020-09-01 IMAGING — XA DG EPIDURAL NERVE ROOT
2 series · 2 of 2 positions shown · non-contrast
Comparison: none

CLINICAL DATA: Low back pain. Right L5 radiculopathy. Displacement
of the L5-S1 lumbar disc. Bilateral foraminal narrowing at L5-S1,
right greater than left.

[Series 1: ortho standard · 1 of 1 slices shown (1 of 2)]
[im 1/1]
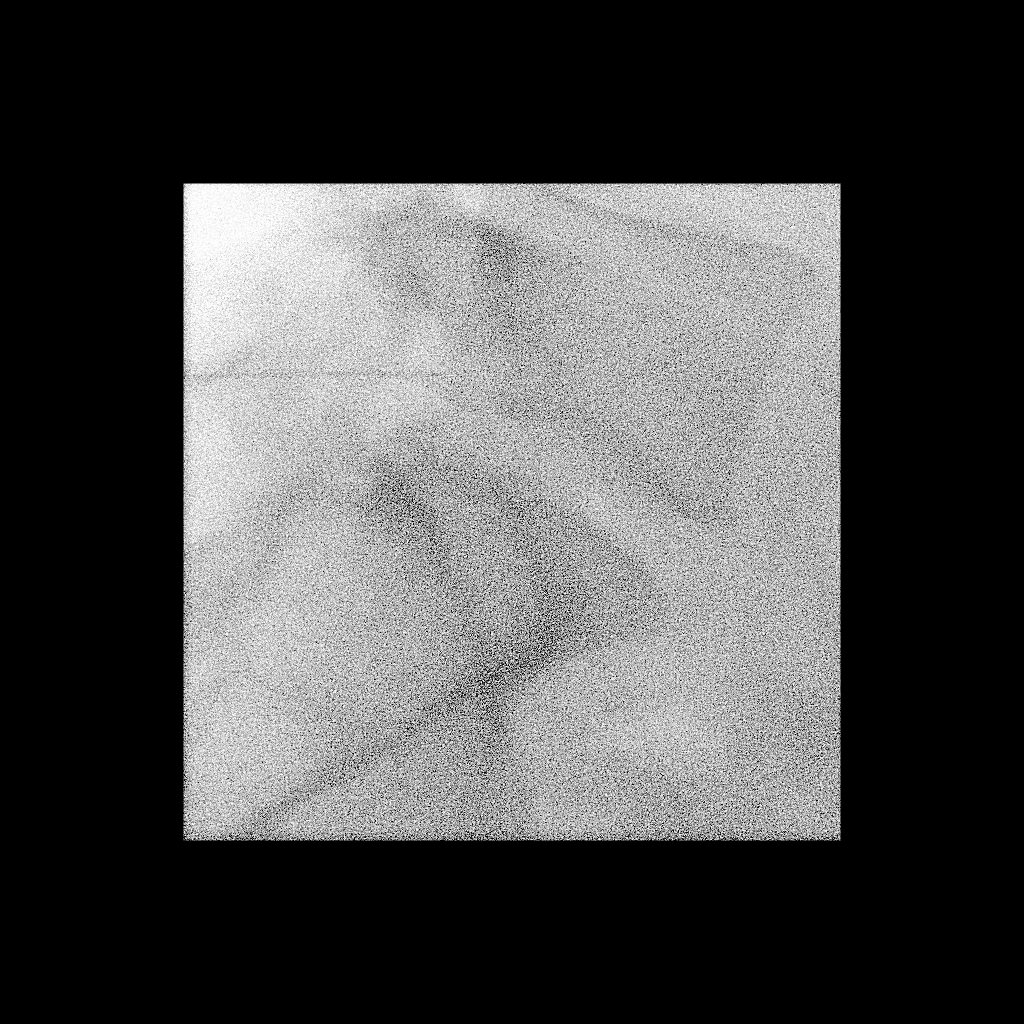

[Series 2: ortho standard · 1 of 1 slices shown (2 of 2)]
[im 1/1]
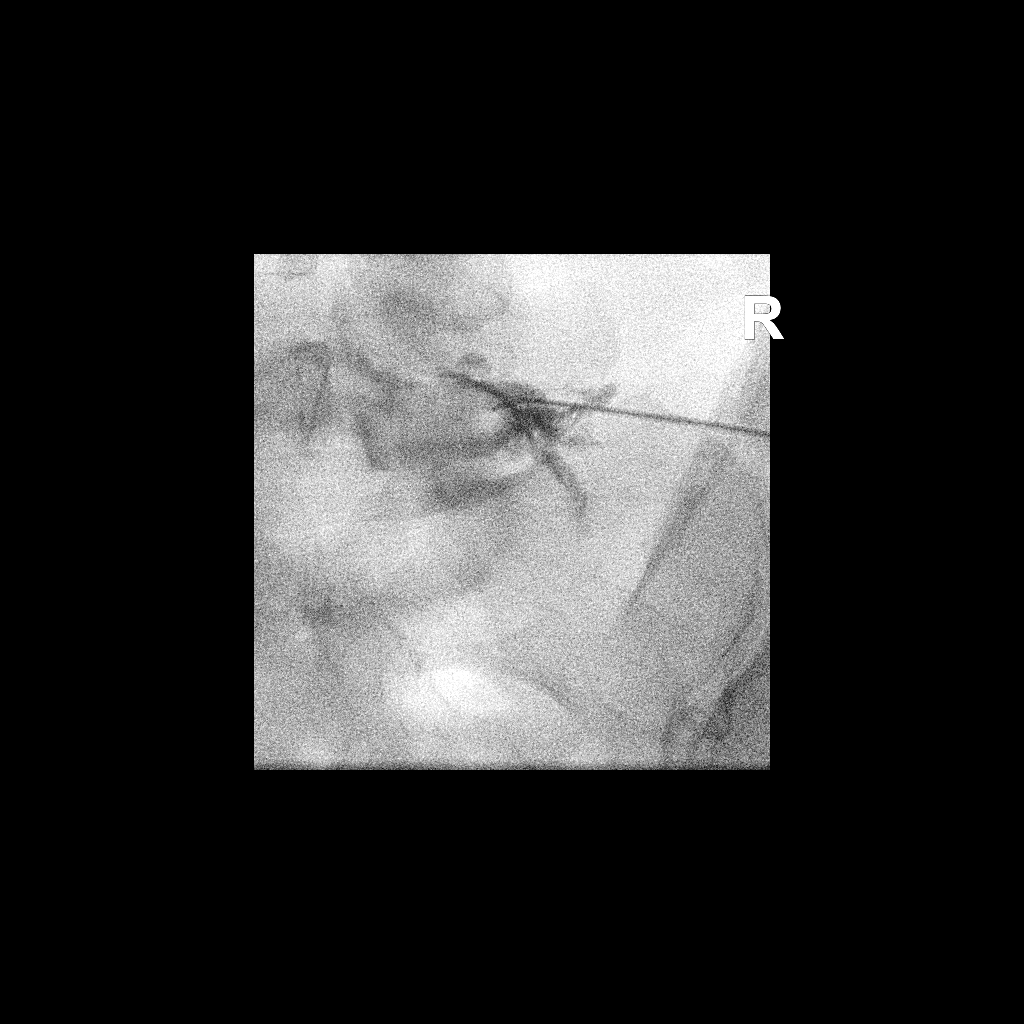

[2 of 2 positions shown; findings below may reference images not displayed]

FLUOROSCOPY TIME:  Radiation Exposure Index (as provided by the
fluoroscopic device): 30.74 uGy*m2

PROCEDURE:
SELECTIVE NERVE ROOT AND TRANSFORAMINAL EPIDURAL STEROID INJECTION:

A transforaminal approach was performed on the right at the L5-S1
foramen. The overlying skin was cleansed and anesthetized. A 22
gauge spinal needle was advanced into the foramen from a lateral
oblique approach. Injection of 2cc of Omnipaque 180 outlined the
nerve root and extended into the epidural space. No vascular or
intrathecal spread is evident.

I then injected 120 mg of Depo-Medrol and 1.5 ml of 1% lidocaine.
The patient tolerated the procedure without evidence for
complication.

The patient was observed for 20 minutes prior to discharge in stable
neurologic condition.
IMPRESSION: Technically successful selective nerve root block and transforaminal
epidural steroid injection on theright at the L5-S1 foramen.

## 2020-09-01 MED ORDER — IOPAMIDOL (ISOVUE-M 200) INJECTION 41%
1.0000 mL | Freq: Once | INTRAMUSCULAR | Status: AC
  Administered 2020-09-01: 1 mL via EPIDURAL

## 2020-09-01 MED ORDER — METHYLPREDNISOLONE ACETATE 40 MG/ML INJ SUSP (RADIOLOG
120.0000 mg | Freq: Once | INTRAMUSCULAR | Status: AC
  Administered 2020-09-01: 120 mg via EPIDURAL

## 2020-09-01 NOTE — Discharge Instructions (Signed)

## 2020-09-05 ENCOUNTER — Other Ambulatory Visit: Payer: Self-pay | Admitting: Family Medicine

## 2020-09-19 ENCOUNTER — Other Ambulatory Visit: Payer: Self-pay

## 2020-09-19 ENCOUNTER — Telehealth (INDEPENDENT_AMBULATORY_CARE_PROVIDER_SITE_OTHER): Payer: BC Managed Care – PPO | Admitting: Psychiatry

## 2020-09-19 ENCOUNTER — Encounter (HOSPITAL_COMMUNITY): Payer: Self-pay | Admitting: Psychiatry

## 2020-09-19 VITALS — Wt 230.0 lb

## 2020-09-19 DIAGNOSIS — F419 Anxiety disorder, unspecified: Secondary | ICD-10-CM | POA: Diagnosis not present

## 2020-09-19 DIAGNOSIS — F3181 Bipolar II disorder: Secondary | ICD-10-CM

## 2020-09-19 MED ORDER — LAMOTRIGINE 150 MG PO TABS: 150.0000 mg | ORAL_TABLET | Freq: Two times a day (BID) | ORAL | 1 refills | Status: DC

## 2020-09-19 MED ORDER — SERTRALINE HCL 100 MG PO TABS: 200.0000 mg | ORAL_TABLET | Freq: Every day | ORAL | 1 refills | Status: DC

## 2020-09-19 MED ORDER — ARIPIPRAZOLE 2 MG PO TABS: 2.0000 mg | ORAL_TABLET | Freq: Every day | ORAL | 1 refills | Status: DC

## 2020-09-19 NOTE — Progress Notes (Signed)
Virtual Visit via Telephone Note  I connected with Cheryl [name] on 09/19/20 at  8:40 AM EDT by telephone and verified that I am speaking with the correct person using two identifiers.  Location: Patient: Home Provider: Home Office   I discussed the limitations, risks, security and privacy concerns of performing an evaluation and management service by telephone and the availability of in person appointments. I also discussed with the patient that there may be a patient responsible charge related to this service. The patient expressed understanding and agreed to proceed.   History of Present Illness: Patient is evaluated by phone session.  She reported lately feeling sad depressed and sometimes hopeless.  She has fever crying spells but denies any suicidal thoughts plan or any intent.  She is not sure what triggered her but admitted that recently her cat died and she is missing the cat a lot.  She also have chronic pain which is worsening lately.  She is getting injection and also taking gabapentin which help sometimes.  She lives with her husband who is supportive.  She denies any paranoia, hallucination or any suicidal thoughts.  She is working from home.  Her energy level is fair.  She admitted not as active and sometime not motivated to do things.  She like to try Abilify because one of her friend takes and it does work for her.  She is compliant with Lamictal and Zoloft.  She has not taken Klonopin more than 6 months.  Her appetite is okay.  She reported her weight is unchanged from the past.  She is trying to lose the weight but again not motivated to do things.  Past Psychiatric History: [facility] [employer] TriedProzac, wellbutrinand Effexor. Didnot like Prozac andwithdrawal from Effexor. H/Omania, anger and mood swings. No h/oinpatient treatment, suicidal attempt. Seen at [facility] at tried psychiatry but not happy with the services.    Psychiatric Specialty Exam: Physical Exam  Review of Systems  Weight 230 lb (104.3 kg).Body mass index is 37.69 kg/m.  General Appearance: NA  Eye Contact:  NA  Speech:  Clear and Coherent and Slow  Volume:  Decreased  Mood:  Depressed, Dysphoric and Hopeless  Affect:  NA  Thought Process:  Goal Directed  Orientation:  Full (Time, Place, and Person)  Thought Content:  Rumination  Suicidal Thoughts:  No  Homicidal Thoughts:  No  Memory:  Immediate;   Good Recent;   Good Remote;   Good  Judgement:  Intact  Insight:  Present  Psychomotor Activity:  NA  Concentration:  Concentration: Good and Attention Span: Good  Recall:  Good  Fund of Knowledge:  Good  Language:  Good  Akathisia:  No  Handed:  Right  AIMS (if indicated):     Assets:  Communication Skills Desire for Improvement Housing Resilience Social Support Transportation  ADL's:  Intact  Cognition:  WNL  Sleep:   fair      Assessment and Plan: Bipolar disorder type II.  Anxiety.  Patient like to try Abilify because one of her friend does take an it works well for her.  I explained psychotropic has side effects and some panic cause metabolic syndrome, tremors and shakes.  She like to give a try.  I also suggest that she is taking gabapentin 300 mg at bedtime for pain and may consider taking a higher dose up to 600 mg that can help her sleep, pain and anxiety.  She agreed to give a try.  She  has not done hemoglobin A1c in more than a year.  I encouraged to have blood work.  Continue Lamictal 150 mg twice a day, Zoloft 200 mg daily and we will add low-dose Abilify 2 mg daily.  We will do blood work on the next appointment.  Recommended to call us back if she has any question or any concern.  Follow-up in 6 weeks.  Patient like to send all her prescription to her local pharmacy at [facility].  Follow Up Instructions:    I discussed the assessment and treatment plan with the patient. The patient was provided an  opportunity to ask questions and all were answered. The patient agreed with the plan and demonstrated an understanding of the instructions.   The patient was advised to call back or seek an in-person evaluation if the symptoms worsen or if the condition fails to improve as anticipated.  I provided 17 minutes of non-face-to-face time during this encounter.   Kathlee Nations, MD

## 2020-10-13 ENCOUNTER — Other Ambulatory Visit: Payer: Self-pay

## 2020-10-13 ENCOUNTER — Telehealth: Payer: Self-pay | Admitting: Family Medicine

## 2020-10-13 DIAGNOSIS — E1165 Type 2 diabetes mellitus with hyperglycemia: Secondary | ICD-10-CM

## 2020-10-13 MED ORDER — METFORMIN HCL ER 500 MG PO TB24: 1000.0000 mg | ORAL_TABLET | Freq: Every day | ORAL | 0 refills | Status: DC

## 2020-10-13 MED ORDER — LEVOTHYROXINE SODIUM 175 MCG PO TABS
175.0000 ug | ORAL_TABLET | Freq: Every morning | ORAL | 0 refills | Status: DC
Start: 2020-10-13 — End: 2020-10-23

## 2020-10-13 NOTE — Telephone Encounter (Signed)
Rxs sent for 30 days with note to keep pending appointment.

## 2020-10-13 NOTE — Telephone Encounter (Signed)
Medication: metFORMIN (GLUCOPHAGE-XR) 500 MG 24 hr tablet  levothyroxine (SYNTHROID, LEVOTHROID  Patient missed her original appt for med refill in Feb. She did schedule an appt for next week when Lowne returns. She wants to know is if she can get enough meds sent in to hold her off until her appt.   Has the patient contacted their pharmacy? Yes.   (If no, request that the patient contact the pharmacy for the refill.) (If yes, when and what did the pharmacy advise?)  Preferred Pharmacy (with phone number or street name):   [facility] [place], [place] [address]  [place], [place] [address]  Phone:  [phone] Fax:  [phone]   Agent: Please be advised that RX refills may take up to 3 business days. We ask that you follow-up with your pharmacy.

## 2020-10-14 ENCOUNTER — Other Ambulatory Visit: Payer: Self-pay | Admitting: Family Medicine

## 2020-10-15 ENCOUNTER — Other Ambulatory Visit (HOSPITAL_COMMUNITY): Payer: Self-pay | Admitting: *Deleted

## 2020-10-15 ENCOUNTER — Telehealth (HOSPITAL_COMMUNITY): Payer: Self-pay | Admitting: *Deleted

## 2020-10-15 MED ORDER — BENZTROPINE MESYLATE 0.5 MG PO TABS
0.5000 mg | ORAL_TABLET | Freq: Every day | ORAL | 0 refills | Status: DC
Start: 2020-10-15 — End: 2020-10-23

## 2020-10-15 NOTE — Telephone Encounter (Signed)
Pt called with c/o "weird stiffness" in her neck which she feels may be s/e from the Abilify. Pt says that her mood has been much more stable but she is also experiencing decreased sleep since starting Abilify. Please review and advise.

## 2020-10-15 NOTE — Telephone Encounter (Signed)
She is on a very low-dose Abilify 2 mg.  I recommend to try taking in the morning to help her sleep and she can also add low-dose Cogentin 0.5 mg to help her neck stiffness.

## 2020-10-23 ENCOUNTER — Encounter (HOSPITAL_COMMUNITY): Payer: Self-pay | Admitting: Psychiatry

## 2020-10-23 ENCOUNTER — Encounter: Payer: Self-pay | Admitting: Family Medicine

## 2020-10-23 ENCOUNTER — Ambulatory Visit: Payer: BC Managed Care – PPO | Admitting: Family Medicine

## 2020-10-23 ENCOUNTER — Telehealth (INDEPENDENT_AMBULATORY_CARE_PROVIDER_SITE_OTHER): Payer: BC Managed Care – PPO | Admitting: Psychiatry

## 2020-10-23 ENCOUNTER — Other Ambulatory Visit: Payer: Self-pay

## 2020-10-23 VITALS — Wt 229.0 lb

## 2020-10-23 VITALS — BP 126/88 | HR 89 | Temp 99.6°F | Resp 18 | Ht 65.5 in | Wt 231.2 lb

## 2020-10-23 DIAGNOSIS — I10 Essential (primary) hypertension: Secondary | ICD-10-CM | POA: Diagnosis not present

## 2020-10-23 DIAGNOSIS — F419 Anxiety disorder, unspecified: Secondary | ICD-10-CM

## 2020-10-23 DIAGNOSIS — H10021 Other mucopurulent conjunctivitis, right eye: Secondary | ICD-10-CM

## 2020-10-23 DIAGNOSIS — E785 Hyperlipidemia, unspecified: Secondary | ICD-10-CM

## 2020-10-23 DIAGNOSIS — Z1159 Encounter for screening for other viral diseases: Secondary | ICD-10-CM

## 2020-10-23 DIAGNOSIS — E039 Hypothyroidism, unspecified: Secondary | ICD-10-CM

## 2020-10-23 DIAGNOSIS — F3181 Bipolar II disorder: Secondary | ICD-10-CM

## 2020-10-23 DIAGNOSIS — E1165 Type 2 diabetes mellitus with hyperglycemia: Secondary | ICD-10-CM | POA: Diagnosis not present

## 2020-10-23 DIAGNOSIS — E1169 Type 2 diabetes mellitus with other specified complication: Secondary | ICD-10-CM | POA: Diagnosis not present

## 2020-10-23 DIAGNOSIS — G43709 Chronic migraine without aura, not intractable, without status migrainosus: Secondary | ICD-10-CM

## 2020-10-23 MED ORDER — SERTRALINE HCL 100 MG PO TABS: 200.0000 mg | ORAL_TABLET | Freq: Every day | ORAL | 1 refills | Status: DC

## 2020-10-23 MED ORDER — BENZTROPINE MESYLATE 0.5 MG PO TABS: 0.5000 mg | ORAL_TABLET | Freq: Every day | ORAL | 1 refills | Status: DC

## 2020-10-23 MED ORDER — METFORMIN HCL ER 500 MG PO TB24: 1000.0000 mg | ORAL_TABLET | Freq: Every day | ORAL | 0 refills | Status: DC

## 2020-10-23 MED ORDER — MOXIFLOXACIN HCL 0.5 % OP SOLN: 1.0000 [drp] | Freq: Three times a day (TID) | OPHTHALMIC | 0 refills | Status: DC

## 2020-10-23 MED ORDER — ROSUVASTATIN CALCIUM 10 MG PO TABS: 10.0000 mg | ORAL_TABLET | Freq: Every day | ORAL | 1 refills | Status: DC

## 2020-10-23 MED ORDER — ARIPIPRAZOLE 5 MG PO TABS: 5.0000 mg | ORAL_TABLET | Freq: Every day | ORAL | 1 refills | Status: DC

## 2020-10-23 MED ORDER — RYBELSUS 3 MG PO TABS: 3.0000 mg | ORAL_TABLET | Freq: Every day | ORAL | 0 refills | Status: DC

## 2020-10-23 MED ORDER — LEVOTHYROXINE SODIUM 175 MCG PO TABS: 175.0000 ug | ORAL_TABLET | Freq: Every morning | ORAL | 1 refills | Status: DC

## 2020-10-23 MED ORDER — LAMOTRIGINE 150 MG PO TABS: 150.0000 mg | ORAL_TABLET | Freq: Two times a day (BID) | ORAL | 1 refills | Status: DC

## 2020-10-23 MED ORDER — RIZATRIPTAN BENZOATE 10 MG PO TBDP: ORAL_TABLET | ORAL | 0 refills | Status: DC

## 2020-10-23 NOTE — Patient Instructions (Signed)

## 2020-10-23 NOTE — Assessment & Plan Note (Signed)
hgba1c to be checked, minimize simple carbs. Increase exercise as tolerated. Continue current meds  

## 2020-10-23 NOTE — Progress Notes (Signed)
Subjective:   By signing my name below, I, Shehryar Baig, attest that this documentation has been prepared under the direction and in the presence of Dr. Roma Schanz, DO. 10/23/2020     Patient ID: Cheryl [name], female    DOB: 12-06-1975, 45 y.o.   MRN: [id number]  Chief Complaint  Patient presents with  . Diabetes  . Hyperlipidemia  . Hypothyroidism  . Follow-up    HPI Patient is in today for a office visit.  She is complaining of her right eye itching and puffiness since last night. She also notes discharge in her right eye. She has not taken any medication to manage this issue.She requests a refill on 10 mg Crestor daily PO to manage her hypertension. She also has not yet started taking 3 mg rybelsus daily PO since it was prescribed. She denies having any fever, ear pain, congestion, sinus pain, sore throat, chest pain, palpations, cough, shortness of breath, wheezing, nausea, vomiting, diarrhea, constipation, blood in stool, dysuria, frequency, hematuria, dizziness, or headaches at this time.    Past Medical History:  Diagnosis Date  . Anxiety   . Bipolar 1 disorder ([place])   . Bipolar 2 disorder ([place])   . Depression   . Diabetes mellitus without complication ([place])   . Kidney stone   . Migraines    with aura  . OSA (obstructive sleep apnea) 10/17/2013  . Thyroid disease     Past Surgical History:  Procedure Laterality Date  . INTRAUTERINE DEVICE (IUD) INSERTION  10/2011   inserted & removed 2018  . NASAL SEPTUM SURGERY    . OTHER SURGICAL HISTORY     endometrial polyps (2)  . WISDOM TOOTH EXTRACTION      Family History  Problem Relation Age of Onset  . Cancer Mother 28       ovarian  . Hypothyroidism Mother   . Stroke Mother 88  . Hypertension Father   . Diabetes Father   . Hyperlipidemia Father   . Heart disease Father        cabg--stint  . Migraines Father   . Stroke Father   . Hypothyroidism Sister   . Depression Sister   . Cancer  Maternal Aunt        breast   . Cancer Maternal Uncle        throat  . COPD Maternal Grandmother   . Cancer Maternal Grandmother        lung  . Hypertension Maternal Grandmother   . Depression Maternal Grandmother   . Stroke Maternal Grandmother   . Cancer Maternal Grandfather        prostate, lung  . Diabetes Paternal Grandmother   . Stroke Paternal Grandmother   . Alcohol abuse Paternal Grandfather   . Aneurysm Paternal Grandfather   . Depression Cousin        bipolar    Social History   Socioeconomic History  . Marital status: Married    Spouse name: Not on file  . Number of children: Not on file  . Years of education: Not on file  . Highest education level: Not on file  Occupational History  . Occupation: Designer, multimedia: [employer]  Tobacco Use  . Smoking status: Current Every Day Smoker    Packs/day: 1.00    Years: 20.00    Pack years: 20.00    Types: Cigarettes  . Smokeless tobacco: Never Used  Vaping Use  . Vaping Use: Never used  Substance and Sexual Activity  . Alcohol use: No    Alcohol/week: 0.0 standard drinks  . Drug use: No  . Sexual activity: Yes    Partners: Male    Birth control/protection: Pill  Other Topics Concern  . Not on file  Social History Narrative   Exercise-- no   Social Determinants of Health   Financial Resource Strain: Not on file  Food Insecurity: Not on file  Transportation Needs: Not on file  Physical Activity: Not on file  Stress: Not on file  Social Connections: Not on file  Intimate Partner Violence: Not on file    Outpatient Medications Prior to Visit  Medication Sig Dispense Refill  . ARIPiprazole (ABILIFY) 5 MG tablet Take 1 tablet (5 mg total) by mouth daily. 30 tablet 1  . benztropine (COGENTIN) 0.5 MG tablet Take 1 tablet (0.5 mg total) by mouth daily. 30 tablet 1  . gabapentin (NEURONTIN) 100 MG capsule Take 2 capsules (200 mg total) by mouth at bedtime. 180 capsule 3  .  ibuprofen (ADVIL,MOTRIN) 200 MG tablet Take 400 mg by mouth every 6 (six) hours as needed.    . lamoTRIgine (LAMICTAL) 150 MG tablet Take 1 tablet (150 mg total) by mouth 2 (two) times daily. 60 tablet 1  . naproxen sodium (ANAPROX) 220 MG tablet Take 220 mg by mouth 2 (two) times daily as needed (pain).    Glory Rosebush DELICA LANCETS FINE MISC As directed qd 100 each 1  . ONETOUCH VERIO test strip Use to check blood sugar once a day. DX E11.51, E11.65 100 each 1  . sertraline (ZOLOFT) 100 MG tablet Take 2 tablets (200 mg total) by mouth daily. 60 tablet 1  . levothyroxine (SYNTHROID) 175 MCG tablet Take 1 tablet (175 mcg total) by mouth every morning. on an empty stomach; Pt needs OV for further refills 30 tablet 0  . metFORMIN (GLUCOPHAGE-XR) 500 MG 24 hr tablet Take 2 tablets (1,000 mg total) by mouth daily with breakfast. 60 tablet 0  . rizatriptan (MAXALT-MLT) 10 MG disintegrating tablet DISSOLVE ONE TABLET IN THE MOUTH DAILY AS NEEDED FOR MIGRAINE. MAY REPEAT IN 2 HOURS IF NEEDED. 10 tablet 0  . rosuvastatin (CRESTOR) 10 MG tablet TAKE 1 TABLET BY MOUTH DAILY 30 tablet 2  . Semaglutide (RYBELSUS) 3 MG TABS Take 3 mg by mouth daily. 30 tablet 0  . clonazePAM (KLONOPIN) 0.5 MG tablet Take 1 tablet (0.5 mg total) by mouth daily as needed for anxiety. (Patient not taking: Reported on 06/02/2020) 30 tablet 0   No facility-administered medications prior to visit.    Allergies  Allergen Reactions  . Augmentin [Amoxicillin-Pot Clavulanate] Diarrhea  . Food Itching    Bison-throat tingling    Review of Systems  Constitutional: Negative for fever.  HENT: Negative for congestion, ear pain, sinus pain and sore throat.   Eyes: Positive for pain, discharge and redness.       (+)Itch in right eye.  Respiratory: Negative for cough, shortness of breath and wheezing.   Cardiovascular: Negative for chest pain and palpitations.  Gastrointestinal: Negative for blood in stool, constipation, diarrhea,  nausea and vomiting.  Genitourinary: Negative for dysuria, frequency and hematuria.  Neurological: Negative for dizziness and headaches.       Objective:    Physical Exam Constitutional:      Appearance: Normal appearance.  HENT:     Head: Normocephalic and atraumatic.     Right Ear: External ear normal.     Left Ear:  External ear normal.  Eyes:     Extraocular Movements: Extraocular movements intact.     Conjunctiva/sclera:     Right eye: Right conjunctiva is injected.     Pupils: Pupils are equal, round, and reactive to light.  Cardiovascular:     Rate and Rhythm: Normal rate and regular rhythm.     Pulses: Normal pulses.     Heart sounds: Normal heart sounds.  Pulmonary:     Effort: Pulmonary effort is normal. No respiratory distress.     Breath sounds: Normal breath sounds. No wheezing, rhonchi or rales.  Skin:    General: Skin is warm and dry.  Neurological:     Mental Status: She is alert and oriented to person, place, and time.  Psychiatric:        Behavior: Behavior normal.     BP 126/88 (BP Location: Right Arm, Patient Position: Sitting, Cuff Size: Large)   Pulse 89   Temp 99.6 F (37.6 C) (Oral)   Resp 18   Ht 5' 5.5" (1.664 m)   Wt 231 lb 3.2 oz (104.9 kg)   SpO2 95%   BMI 37.89 kg/m  Wt Readings from Last 3 Encounters:  10/23/20 231 lb 3.2 oz (104.9 kg)  04/10/20 230 lb (104.3 kg)  02/28/20 230 lb (104.3 kg)    Diabetic Foot Exam - Simple   No data filed    Lab Results  Component Value Date   WBC 8.8 08/14/2018   HGB 14.6 08/14/2018   HCT 43.3 08/14/2018   PLT 250.0 08/14/2018   GLUCOSE 185 (H) 10/09/2019   CHOL 210 (H) 10/09/2019   TRIG 282.0 (H) 10/09/2019   HDL 54.20 10/09/2019   LDLDIRECT 125.0 10/09/2019   LDLCALC 115 (H) 01/07/2017   ALT 31 10/09/2019   AST 21 10/09/2019   NA 136 10/09/2019   K 4.2 10/09/2019   CL 97 10/09/2019   CREATININE 0.76 10/09/2019   BUN 20 10/09/2019   CO2 30 10/09/2019   TSH 9.63 (H) 10/09/2019    HGBA1C 7.2 (H) 10/09/2019   MICROALBUR 0.9 10/09/2019    Lab Results  Component Value Date   TSH 9.63 (H) 10/09/2019   Lab Results  Component Value Date   WBC 8.8 08/14/2018   HGB 14.6 08/14/2018   HCT 43.3 08/14/2018   MCV 93.3 08/14/2018   PLT 250.0 08/14/2018   Lab Results  Component Value Date   NA 136 10/09/2019   K 4.2 10/09/2019   CO2 30 10/09/2019   GLUCOSE 185 (H) 10/09/2019   BUN 20 10/09/2019   CREATININE 0.76 10/09/2019   BILITOT 0.3 10/09/2019   ALKPHOS 78 10/09/2019   AST 21 10/09/2019   ALT 31 10/09/2019   PROT 7.1 10/09/2019   ALBUMIN 4.5 10/09/2019   CALCIUM 9.8 10/09/2019   ANIONGAP 12 08/21/2016   GFR 82.73 10/09/2019   Lab Results  Component Value Date   CHOL 210 (H) 10/09/2019   Lab Results  Component Value Date   HDL 54.20 10/09/2019   Lab Results  Component Value Date   LDLCALC 115 (H) 01/07/2017   Lab Results  Component Value Date   TRIG 282.0 (H) 10/09/2019   Lab Results  Component Value Date   CHOLHDL 4 10/09/2019   Lab Results  Component Value Date   HGBA1C 7.2 (H) 10/09/2019       Assessment & Plan:   Problem List Items Addressed This Visit      Unprioritized   Hyperlipidemia LDL  goal <70    Tolerating statin, encouraged heart healthy diet, avoid trans fats, minimize simple carbs and saturated fats. Increase exercise as tolerated      Relevant Medications   rosuvastatin (CRESTOR) 10 MG tablet   Hypothyroidism    Check labs con't synthroid      Relevant Medications   levothyroxine (SYNTHROID) 175 MCG tablet   Other Relevant Orders   TSH   Uncontrolled type 2 diabetes mellitus with hyperglycemia ([place]) - Primary    hgba1c to be checked , minimize simple carbs. Increase exercise as tolerated. Continue current meds       Relevant Medications   rosuvastatin (CRESTOR) 10 MG tablet   metFORMIN (GLUCOPHAGE-XR) 500 MG 24 hr tablet   Semaglutide (RYBELSUS) 3 MG TABS   Other Relevant Orders   Hemoglobin A1c    Comprehensive metabolic panel    Other Visit Diagnoses    Need for hepatitis C screening test       Relevant Orders   Microalbumin / creatinine urine ratio   Hepatitis C antibody   Primary hypertension       Relevant Medications   rosuvastatin (CRESTOR) 10 MG tablet   Chronic migraine without aura without status migrainosus, not intractable       Relevant Medications   rizatriptan (MAXALT-MLT) 10 MG disintegrating tablet   rosuvastatin (CRESTOR) 10 MG tablet   Hyperlipidemia associated with type 2 diabetes mellitus ([facility])       Relevant Medications   rosuvastatin (CRESTOR) 10 MG tablet   metFORMIN (GLUCOPHAGE-XR) 500 MG 24 hr tablet   Semaglutide (RYBELSUS) 3 MG TABS   Other Relevant Orders   Lipid panel   Pink eye disease of right eye       Relevant Medications   moxifloxacin (VIGAMOX) 0.5 % ophthalmic solution       Meds ordered this encounter  Medications  . rizatriptan (MAXALT-MLT) 10 MG disintegrating tablet    Sig: DISSOLVE ONE TABLET IN THE MOUTH DAILY AS NEEDED FOR MIGRAINE. MAY REPEAT IN 2 HOURS IF NEEDED.    Dispense:  10 tablet    Refill:  0  . rosuvastatin (CRESTOR) 10 MG tablet    Sig: Take 1 tablet (10 mg total) by mouth daily.    Dispense:  90 tablet    Refill:  1  . levothyroxine (SYNTHROID) 175 MCG tablet    Sig: Take 1 tablet (175 mcg total) by mouth every morning. on an empty stomach; Pt needs OV for further refills    Dispense:  90 tablet    Refill:  1  . metFORMIN (GLUCOPHAGE-XR) 500 MG 24 hr tablet    Sig: Take 2 tablets (1,000 mg total) by mouth daily with breakfast.    Dispense:  60 tablet    Refill:  0  . moxifloxacin (VIGAMOX) 0.5 % ophthalmic solution    Sig: Place 1 drop into the right eye 3 (three) times daily.    Dispense:  3 mL    Refill:  0  . Semaglutide (RYBELSUS) 3 MG TABS    Sig: Take 3 mg by mouth daily.    Dispense:  30 tablet    Refill:  0    I, Ann Held, DO, personally preformed the services described in this  documentation.  All medical record entries made by the scribe were at my direction and in my presence.  I have reviewed the chart and discharge instructions (if applicable) and agree that the record reflects my personal performance and is  accurate and complete. 10/23/2020   I,Shehryar Baig,acting as a scribe for Ann Held, DO.,have documented all relevant documentation on the behalf of Ann Held, DO,as directed by  Ann Held, DO while in the presence of Ann Held, DO.   Ann Held, DO

## 2020-10-23 NOTE — Progress Notes (Signed)
Virtual Visit via Telephone Note  I connected with Cheryl [name] on 10/23/20 at 11:20 AM EDT by telephone and verified that I am speaking with the correct person using two identifiers.  Location: Patient: Work Provider: Biomedical scientist   I discussed the limitations, risks, security and privacy concerns of performing an evaluation and management service by telephone and the availability of in person appointments. I also discussed with the patient that there may be a patient responsible charge related to this service. The patient expressed understanding and agreed to proceed.   History of Present Illness: Patient is evaluated by phone session.  We started her on Abilify on the last dose as she was feeling sad depressed and unmotivated.  She is taking 2 mg and she noticed improvement in her energy, motivation and depression.  She admitted increased energy level and intimacy with her husband.  She also spent time in the backyard and really liked Abilify.  However she noticed neck stiffness and she was given Cogentin and recommend to take Abilify in the morning.  Since then she is feeling better.  She has no agitation, anger, suicidal thoughts.  She like to go up on the dose of Abilify.  She feels energy level had her more motivated and she may have lost a few pounds from the past.  She has appointment to see her PCP today for blood work.  Patient has no tremors, shakes or any EPS.  Her sleep is improved.  Her job is going well.  She is also compliant with other medication and reported no rash, itching.  Past Psychiatric History: [place] Tried[place]zac, wellbutrinand Effexor. Didnot like Prozac andwithdrawal from Effexor. H/Omania, anger and mood swings. No h/oinpatient treatment, suicidal attempt. Saw Jobie Quaker at tried psychiatry but not happy with the services.   Psychiatric Specialty Exam: Physical Exam  Review of Systems  Weight 229 lb  (103.9 kg).There is no height or weight on file to calculate BMI.  General Appearance: NA  Eye Contact:  NA  Speech:  Clear and Coherent  Volume:  Normal  Mood:  better  Affect:  NA  Thought Process:  Goal Directed  Orientation:  Full (Time, Place, and Person)  Thought Content:  Logical  Suicidal Thoughts:  No  Homicidal Thoughts:  No  Memory:  Immediate;   Good Recent;   Good Remote;   Good  Judgement:  Good  Insight:  Present  Psychomotor Activity:  NA  Concentration:  Concentration: Good and Attention Span: Good  Recall:  Good  Fund of Knowledge:  Good  Language:  Good  Akathisia:  No  Handed:  Right  AIMS (if indicated):     Assets:  Communication Skills Desire for Improvement Intimacy Social Support Talents/Skills Transportation  ADL's:  Intact  Cognition:  WNL  Sleep:   better     Assessment and Plan: Bipolar disorder type II.  Anxiety.  Patient really like Abilify and noticed improvement in her mood, energy level and motivation.  She like to try higher dose however I recommend if she started to have EPS, stiffness tremors and she either cut down back to 2.5 or take higher dose of Cogentin.  She agreed with the plan.  We will call a new prescription of Abilify 5 mg daily, continue Zoloft 200 mg daily, Lamictal 150 mg twice a day and Cogentin 0.5 mg daily.  Patient is not taking gabapentin which was given by her other provider for chronic pain.  I recommend she  may consider taking if pain is still exist.  Encouraged to keep appointment with PCP as patient has blood work and physical today.  Recommended to call us back if she is any question or any concern.  Follow-up in 2 months.  Follow Up Instructions:    I discussed the assessment and treatment plan with the patient. The patient was provided an opportunity to ask questions and all were answered. The patient agreed with the plan and demonstrated an understanding of the instructions.   The patient was advised to  call back or seek an in-person evaluation if the symptoms worsen or if the condition fails to improve as anticipated.  I provided 19 minutes of non-face-to-face time during this encounter.   Kathlee Nations, MD

## 2020-10-23 NOTE — Assessment & Plan Note (Signed)
Check labs con't synthroid 

## 2020-10-23 NOTE — Assessment & Plan Note (Signed)
Tolerating statin, encouraged heart healthy diet, avoid trans fats, minimize simple carbs and saturated fats. Increase exercise as tolerated 

## 2020-10-24 ENCOUNTER — Telehealth: Payer: Self-pay

## 2020-10-24 LAB — HEMOGLOBIN A1C: Hgb A1c MFr Bld: 8.2 % — ABNORMAL HIGH (ref 4.6–6.5)

## 2020-10-24 LAB — COMPREHENSIVE METABOLIC PANEL
ALT: 73 U/L — ABNORMAL HIGH (ref 0–35)
AST: 85 U/L — ABNORMAL HIGH (ref 0–37)
Albumin: 4.5 g/dL (ref 3.5–5.2)
Alkaline Phosphatase: 75 U/L (ref 39–117)
BUN: 17 mg/dL (ref 6–23)
CO2: 29 mEq/L (ref 19–32)
Calcium: 9.5 mg/dL (ref 8.4–10.5)
Chloride: 99 mEq/L (ref 96–112)
Creatinine, Ser: 0.86 mg/dL (ref 0.40–1.20)
GFR: 81.89 mL/min (ref >60.00)
Glucose, Bld: 176 mg/dL — ABNORMAL HIGH (ref 70–99)
Potassium: 3.8 mEq/L (ref 3.5–5.1)
Sodium: 138 mEq/L (ref 135–145)
Total Bilirubin: 0.5 mg/dL (ref 0.2–1.2)
Total Protein: 7.6 g/dL (ref 6.0–8.3)

## 2020-10-24 LAB — HEPATITIS C ANTIBODY
Hepatitis C Ab: NONREACTIVE
SIGNAL TO CUT-OFF: 0 (ref <1.00)

## 2020-10-24 LAB — LIPID PANEL
Cholesterol: 342 mg/dL — ABNORMAL HIGH (ref 0–200)
HDL: 45.2 mg/dL (ref >39.00)
Total CHOL/HDL Ratio: 8
Triglycerides: 569 mg/dL — ABNORMAL HIGH (ref 0.0–149.0)

## 2020-10-24 LAB — LDL CHOLESTEROL, DIRECT: Direct LDL: 238 mg/dL

## 2020-10-24 LAB — MICROALBUMIN / CREATININE URINE RATIO
Creatinine,U: 115.9 mg/dL
Microalb Creat Ratio: 7.4 mg/g (ref 0.0–30.0)
Microalb, Ur: 8.5 mg/dL — ABNORMAL HIGH (ref 0.0–1.9)

## 2020-10-24 LAB — TSH: TSH: 45.41 u[IU]/mL — ABNORMAL HIGH (ref 0.35–4.50)

## 2020-10-24 NOTE — Telephone Encounter (Signed)
PA sent to insurance  Key: [id number]

## 2020-10-29 ENCOUNTER — Other Ambulatory Visit: Payer: Self-pay

## 2020-10-29 DIAGNOSIS — E039 Hypothyroidism, unspecified: Secondary | ICD-10-CM

## 2020-10-29 MED ORDER — LEVOTHYROXINE SODIUM 200 MCG PO TABS: 200.0000 ug | ORAL_TABLET | Freq: Every day | ORAL | 0 refills | Status: DC

## 2020-10-30 LAB — HM DIABETES EYE EXAM

## 2020-11-04 ENCOUNTER — Telehealth: Payer: Self-pay

## 2020-11-04 NOTE — Telephone Encounter (Signed)
Please let pt know--- amerge 1 mg 1 po at onset---- may repeat in 4 hours  If 1mg  does not work we can inc to 2.5 Max 5 mg in 24 h period   #10

## 2020-11-04 NOTE — Telephone Encounter (Signed)
Left vm to return call.    

## 2020-11-04 NOTE — Telephone Encounter (Signed)
Received fax that Rizatriptan Benzoate was denied: the request does not meet the definition of medication necessity found in the member's benefit booklet.   The member must try and fail: generic sumatriptan, naratriptan, and amlotriptan.   Fax placed in your folder.

## 2020-11-18 ENCOUNTER — Other Ambulatory Visit: Payer: Self-pay | Admitting: Family Medicine

## 2020-11-18 DIAGNOSIS — E1165 Type 2 diabetes mellitus with hyperglycemia: Secondary | ICD-10-CM

## 2020-11-21 ENCOUNTER — Emergency Department (HOSPITAL_BASED_OUTPATIENT_CLINIC_OR_DEPARTMENT_OTHER)
Admission: EM | Admit: 2020-11-21 | Discharge: 2020-11-21 | Disposition: A | Discharge status: Home / Self Care | Payer: BC Managed Care – PPO | Attending: Emergency Medicine | Admitting: Emergency Medicine

## 2020-11-21 ENCOUNTER — Emergency Department (HOSPITAL_BASED_OUTPATIENT_CLINIC_OR_DEPARTMENT_OTHER): Payer: BC Managed Care – PPO

## 2020-11-21 ENCOUNTER — Encounter (HOSPITAL_BASED_OUTPATIENT_CLINIC_OR_DEPARTMENT_OTHER): Payer: Self-pay | Admitting: *Deleted

## 2020-11-21 ENCOUNTER — Other Ambulatory Visit: Payer: Self-pay

## 2020-11-21 DIAGNOSIS — Z7984 Long term (current) use of oral hypoglycemic drugs: Secondary | ICD-10-CM | POA: Insufficient documentation

## 2020-11-21 DIAGNOSIS — E039 Hypothyroidism, unspecified: Secondary | ICD-10-CM | POA: Insufficient documentation

## 2020-11-21 DIAGNOSIS — F17210 Nicotine dependence, cigarettes, uncomplicated: Secondary | ICD-10-CM | POA: Insufficient documentation

## 2020-11-21 DIAGNOSIS — E1169 Type 2 diabetes mellitus with other specified complication: Secondary | ICD-10-CM | POA: Diagnosis not present

## 2020-11-21 DIAGNOSIS — R079 Chest pain, unspecified: Secondary | ICD-10-CM | POA: Diagnosis not present

## 2020-11-21 DIAGNOSIS — R202 Paresthesia of skin: Secondary | ICD-10-CM | POA: Insufficient documentation

## 2020-11-21 DIAGNOSIS — E785 Hyperlipidemia, unspecified: Secondary | ICD-10-CM | POA: Insufficient documentation

## 2020-11-21 DIAGNOSIS — Z79899 Other long term (current) drug therapy: Secondary | ICD-10-CM | POA: Insufficient documentation

## 2020-11-21 LAB — BASIC METABOLIC PANEL
Anion gap: 8 (ref 5–15)
BUN: 13 mg/dL (ref 6–20)
CO2: 28 mmol/L (ref 22–32)
Calcium: 9.2 mg/dL (ref 8.9–10.3)
Chloride: 100 mmol/L (ref 98–111)
Creatinine, Ser: 0.71 mg/dL (ref 0.44–1.00)
GFR, Estimated: >60 mL/min (ref >60)
Glucose, Bld: 185 mg/dL — ABNORMAL HIGH (ref 70–99)
Potassium: 3.8 mmol/L (ref 3.5–5.1)
Sodium: 136 mmol/L (ref 135–145)

## 2020-11-21 LAB — CBC WITH DIFFERENTIAL/PLATELET
Abs Immature Granulocytes: 0.03 10*3/uL (ref 0.00–0.07)
Basophils Absolute: 0.1 10*3/uL (ref 0.0–0.1)
Basophils Relative: 1 %
Eosinophils Absolute: 0.2 10*3/uL (ref 0.0–0.5)
Eosinophils Relative: 2 %
HCT: 38.4 % (ref 36.0–46.0)
Hemoglobin: 12.8 g/dL (ref 12.0–15.0)
Immature Granulocytes: 0 %
Lymphocytes Relative: 25 %
Lymphs Abs: 2.1 10*3/uL (ref 0.7–4.0)
MCH: 31.7 pg (ref 26.0–34.0)
MCHC: 33.3 g/dL (ref 30.0–36.0)
MCV: 95 fL (ref 80.0–100.0)
Monocytes Absolute: 0.6 10*3/uL (ref 0.1–1.0)
Monocytes Relative: 7 %
Neutro Abs: 5.7 10*3/uL (ref 1.7–7.7)
Neutrophils Relative %: 65 %
Platelets: 232 10*3/uL (ref 150–400)
RBC: 4.04 MIL/uL (ref 3.87–5.11)
RDW: 13.2 % (ref 11.5–15.5)
WBC: 8.6 10*3/uL (ref 4.0–10.5)
nRBC: 0 % (ref 0.0–0.2)

## 2020-11-21 LAB — TROPONIN I (HIGH SENSITIVITY)
Troponin I (High Sensitivity): 2 ng/L (ref <18)
Troponin I (High Sensitivity): 2 ng/L (ref <18)

## 2020-11-21 IMAGING — DX DG CHEST 1V PORT
1 series · 1 of 1 positions shown · non-contrast
Comparison: Portable exam [9U] hours without priors for comparison

CLINICAL DATA: LEFT side chest pain intermittently for 2 weeks

EXAM:
PORTABLE CHEST 1 VIEW

[chest ap]
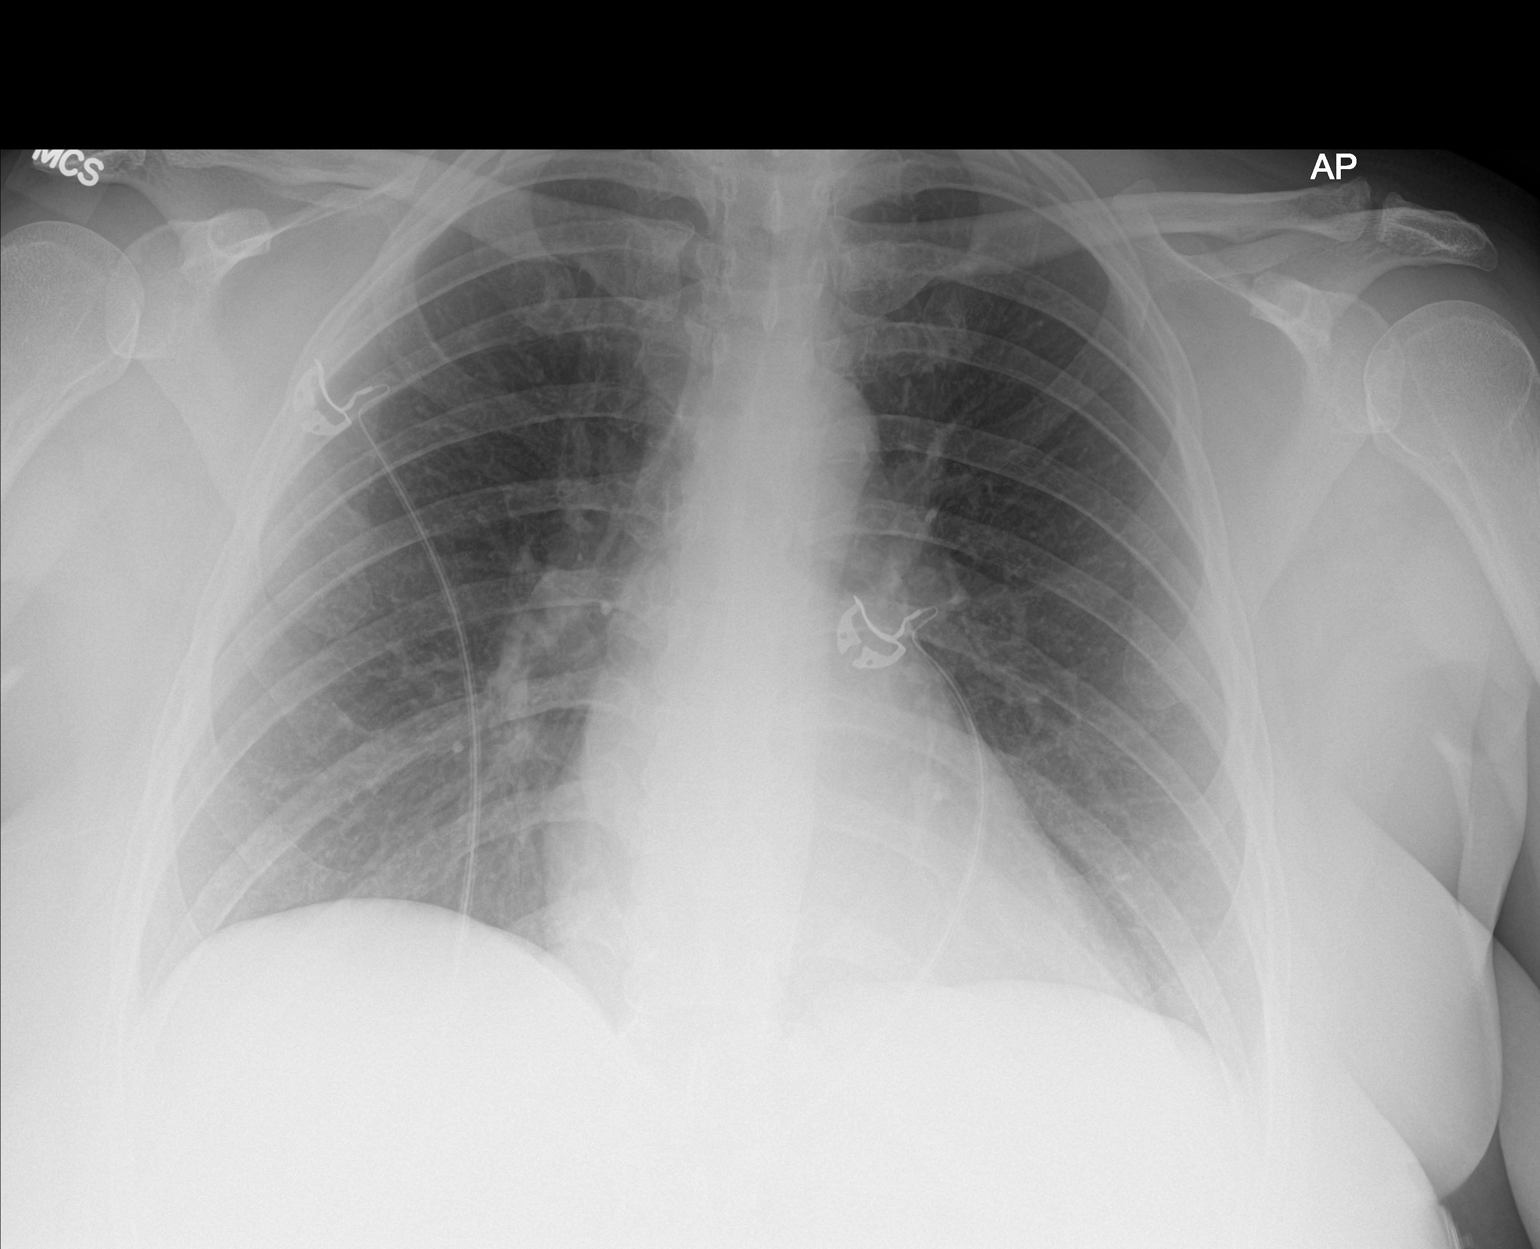

[1 of 1 positions shown; findings below may reference images not displayed]

FINDINGS: Normal heart size, mediastinal contours, and pulmonary vascularity.

Lungs clear.

No pleural effusion or pneumothorax.

Bones unremarkable.
IMPRESSION: Normal exam.

## 2020-11-21 NOTE — ED Triage Notes (Signed)
C/o left sided chest pain x 2 weeks

## 2020-11-21 NOTE — ED Provider Notes (Signed)
Here with atypical sounding chest pain.  Heart score is 3.  First troponin normal.  No concern for PE.  PERC negative.  Lab work thus far is unremarkable.  Plan is for delta troponin.  If troponin normal we will have her follow-up with cardiology outpatient given low risk score.  Troponin within normal limits.  We will have her follow-up with cardiology.  Discharged in good condition.  This chart was dictated using voice recognition software.  Despite best efforts to proofread,  errors can occur which can change the documentation meaning.     Lennice Sites, DO 11/21/20 1755

## 2020-11-21 NOTE — ED Provider Notes (Signed)
[facility] EMERGENCY DEPARTMENT Provider Note   CSN: [id number] Arrival date & time: 11/21/20  1327     History Chief Complaint  Patient presents with  . Chest Pain    Cheryl [name] is a 45 y.o. female.  HPI      45yo bipolar, DM, OSA, smoking, hyperlipidemia presents with concern for chest pain.  Left chest localized, sharp 2 weeks of pain, today it worsened and became more sharp while sitting at desk Comes and goes, not constant pain, can't predict when it will happen Not worse with exertion, not positional, maybe pleuritic Difficult to call dyspnea because of smoking history.  Reports when pain is most severe makes her want to breath deeply. No nausea or vomiting.  Pain in the back in the same area and tingling in left arm.   No jaw radiation. Maybe some to neck.  Every once in a while has felt this way when needed to burp. Pain not worse with eating.  Thinks it may be gas. Denies cough, leg pain or swelling, recent surgeries, immobilization, long trips car or airplane, or birth control use.  Pain right now is 0, earlier was 9/10 which is why she presented today. Other episodes 6-7/10-,  Last a few seconds     No htn injury.  No fam hx of early CAD less than 31, dad had heart disease age 3 Smoking Past Medical History:  Diagnosis Date  . Anxiety   . Bipolar 1 disorder ([place])   . Bipolar 2 disorder ([place])   . Depression   . Diabetes mellitus without complication ([place])   . Kidney stone   . Migraines    with aura  . OSA (obstructive sleep apnea) 10/17/2013  . Thyroid disease     Patient Active Problem List   Diagnosis Date Noted  . Uncontrolled type 2 diabetes mellitus with hyperglycemia ([place]) 10/23/2020  . Pars defect of lumbar spine 02/28/2020  . Cigarette smoker 10/13/2017    Class: Chronic  . Cat bite of left hand including fingers with infection 04/13/2017  . Hyperlipidemia LDL goal <70 01/07/2017  . Breast mass 11/20/2013  . OSA  (obstructive sleep apnea) 10/17/2013  . DM (diabetes mellitus) type II uncontrolled, periph vascular disorder ([place]) 01/30/2013  . RECTAL BLEEDING 09/10/2008  . KNEE PAIN, LEFT 04/09/2008  . BACK PAIN 04/09/2008  . Hypothyroidism 02/17/2007  . HIRSUTISM 02/01/2007  . ACNE NEC 02/01/2007  . Bipolar II disorder ([place]) 12/20/2006  . DERMATITIS, OTHER ATOPIC 12/20/2006    Past Surgical History:  Procedure Laterality Date  . INTRAUTERINE DEVICE (IUD) INSERTION  10/2011   inserted & removed 2018  . NASAL SEPTUM SURGERY    . OTHER SURGICAL HISTORY     endometrial polyps (2)  . WISDOM TOOTH EXTRACTION       OB History    Gravida  0   Para  0   Term  0   Preterm  0   AB  0   Living  0     SAB  0   IAB  0   Ectopic  0   Multiple  0   Live Births              Family History  Problem Relation Age of Onset  . Cancer Mother 16       ovarian  . Hypothyroidism Mother   . Stroke Mother 3  . Hypertension Father   . Diabetes Father   . Hyperlipidemia Father   .  Heart disease Father        cabg--stint  . Migraines Father   . Stroke Father   . Hypothyroidism Sister   . Depression Sister   . Cancer Maternal Aunt        breast   . Cancer Maternal Uncle        throat  . COPD Maternal Grandmother   . Cancer Maternal Grandmother        lung  . Hypertension Maternal Grandmother   . Depression Maternal Grandmother   . Stroke Maternal Grandmother   . Cancer Maternal Grandfather        prostate, lung  . Diabetes Paternal Grandmother   . Stroke Paternal Grandmother   . Alcohol abuse Paternal Grandfather   . Aneurysm Paternal Grandfather   . Depression Cousin        bipolar    Social History   Tobacco Use  . Smoking status: Current Every Day Smoker    Packs/day: 1.00    Years: 20.00    Pack years: 20.00    Types: Cigarettes  . Smokeless tobacco: Never Used  Vaping Use  . Vaping Use: Never used  Substance Use Topics  . Alcohol use: No    Alcohol/week:  0.0 standard drinks  . Drug use: No    Home Medications Prior to Admission medications   Medication Sig Start Date End Date Taking? Authorizing Provider  ARIPiprazole (ABILIFY) 5 MG tablet Take 1 tablet (5 mg total) by mouth daily. 10/23/20   Arfeen, Arlyce Harman, MD  benztropine (COGENTIN) 0.5 MG tablet Take 1 tablet (0.5 mg total) by mouth daily. 10/23/20 10/23/21  Arfeen, Arlyce Harman, MD  gabapentin (NEURONTIN) 100 MG capsule Take 2 capsules (200 mg total) by mouth at bedtime. 03/04/20   Lyndal Pulley, DO  ibuprofen (ADVIL,MOTRIN) 200 MG tablet Take 400 mg by mouth every 6 (six) hours as needed.    [provider]  lamoTRIgine (LAMICTAL) 150 MG tablet Take 1 tablet (150 mg total) by mouth 2 (two) times daily. 10/23/20   Arfeen, Arlyce Harman, MD  levothyroxine (SYNTHROID) 175 MCG tablet TAKE 1 TABLET BY MOUTH EVERY MORNING ON AN EMPTY STOMACH 11/18/20   Carollee Herter, Alferd Apa, DO  levothyroxine (SYNTHROID) 200 MCG tablet Take 1 tablet (200 mcg total) by mouth daily. 10/29/20   Roma Schanz R, DO  metFORMIN (GLUCOPHAGE-XR) 500 MG 24 hr tablet TAKE 2 TABLETS BY MOUTH DAILY 11/18/20   Carollee Herter, Alferd Apa, DO  moxifloxacin (VIGAMOX) 0.5 % ophthalmic solution Place 1 drop into the right eye 3 (three) times daily. 10/23/20   Ann Held, DO  naproxen sodium (ANAPROX) 220 MG tablet Take 220 mg by mouth 2 (two) times daily as needed (pain).    [provider]  [place] As directed qd 04/11/17   Carollee Herter, Alferd Apa, DO  [facility] VERIO test strip Use to check blood sugar once a day. DX E11.51, E11.65 06/08/18   Carollee Herter, Kendrick Fries R, DO  rizatriptan (MAXALT-MLT) 10 MG disintegrating tablet DISSOLVE ONE TABLET IN THE MOUTH DAILY AS NEEDED FOR MIGRAINE. MAY REPEAT IN 2 HOURS IF NEEDED. 10/23/20   Carollee Herter, Alferd Apa, DO  rosuvastatin (CRESTOR) 10 MG tablet Take 1 tablet (10 mg total) by mouth daily. 10/23/20   Carollee Herter, Yvonne R, DO  Semaglutide (RYBELSUS) 3 MG TABS  Take 3 mg by mouth daily. 10/23/20   Roma Schanz R, DO  sertraline (ZOLOFT) 100 MG  tablet Take 2 tablets (200 mg total) by mouth daily. 10/23/20   Arfeen, Arlyce Harman, MD    Allergies    Augmentin [amoxicillin-pot clavulanate] and Food  Review of Systems   Review of Systems  Constitutional: Negative for fever.  HENT: Negative for sore throat.   Eyes: Negative for visual disturbance.  Respiratory: Negative for cough and shortness of breath.   Cardiovascular: Positive for chest pain.  Gastrointestinal: Negative for abdominal pain, nausea and vomiting.  Genitourinary: Negative for difficulty urinating.  Musculoskeletal: Negative for back pain and neck pain.  Skin: Negative for rash.  Neurological: Negative for syncope and headaches.    Physical Exam Updated Vital Signs BP (!) 146/100 (BP Location: Left Arm)   Pulse 82   Temp 98.6 F (37 C) (Oral)   Resp 20   Ht 5\' 6"  (7.939 m)   Wt 102.5 kg   LMP 10/30/2020   SpO2 95%   BMI 36.48 kg/m   Physical Exam Vitals and nursing note reviewed.  Constitutional:      General: She is not in acute distress.    Appearance: She is well-developed. She is not diaphoretic.  HENT:     Head: Normocephalic and atraumatic.  Eyes:     Conjunctiva/sclera: Conjunctivae normal.  Cardiovascular:     Rate and Rhythm: Normal rate and regular rhythm.     Heart sounds: Normal heart sounds. No murmur heard. No friction rub. No gallop.   Pulmonary:     Effort: Pulmonary effort is normal. No respiratory distress.     Breath sounds: Normal breath sounds. No wheezing or rales.  Abdominal:     General: There is no distension.     Palpations: Abdomen is soft.     Tenderness: There is no abdominal tenderness. There is no guarding.  Musculoskeletal:        General: No tenderness.     Cervical back: Normal range of motion.  Skin:    General: Skin is warm and dry.     Findings: No erythema or rash.  Neurological:     Mental Status: She is alert and  oriented to person, place, and time.     ED Results / Procedures / Treatments   Labs (all labs ordered are listed, but only abnormal results are displayed) Labs Reviewed  BASIC METABOLIC PANEL - Abnormal; Notable for the following components:      Result Value   Glucose, Bld 185 (*)    All other components within normal limits  CBC WITH DIFFERENTIAL/PLATELET  TROPONIN I (HIGH SENSITIVITY)    EKG EKG Interpretation  Date/Time:  Friday November 21 2020 13:37:16 EDT Ventricular Rate:  87 PR Interval:  168 QRS Duration: 82 QT Interval:  382 QTC Calculation: 459 R Axis:   33 Text Interpretation: Normal sinus rhythm Cannot rule out Anterior infarct , age undetermined Abnormal ECG No previous ECGs available Confirmed by Gareth Morgan [phone]) on 11/21/2020 1:47:37 PM   Radiology DG Chest Portable 1 View  Result Date: 11/21/2020 CLINICAL DATA:  LEFT side chest pain intermittently for 2 weeks EXAM: PORTABLE CHEST 1 VIEW COMPARISON:  Portable exam 1433 hours without priors for comparison FINDINGS: Normal heart size, mediastinal contours, and pulmonary vascularity. Lungs clear. No pleural effusion or pneumothorax. Bones unremarkable. IMPRESSION: Normal exam. Electronically Signed   By: Lavonia Dana M.D.   On: 11/21/2020 15:04    Procedures Procedures   Medications Ordered in ED Medications - No data to display  ED Course  I have  reviewed the triage vital signs and the nursing notes.  Pertinent labs & imaging results that were available during my care of the patient were reviewed by me and considered in my medical decision making (see chart for details).    MDM Rules/Calculators/A&P                          44yo bipolar, DM, OSA, smoking, hyperlipidemia presents with concern for chest pain. Differential diagnosis for chest pain includes pulmonary embolus, dissection, pneumothorax, pneumonia, ACS, myocarditis, pericarditis.  EKG was done and evaluate by me and showed no acute ST  changes and no signs of pericarditis. Chest x-ray was done and evaluated by me and radiology and showed no sign of pneumonia or pneumothorax. Patient is PERC negative and low risk [place] and have low suspicion for PE.  Do not feel history or exam are consistent with aortic dissection, normal pulses, intermittent pain, no XR findings. Patient is low risk HEART score (3) and initial troponin negative. Awaiting second troponin at time of transfer of care to Dr. Ronnald Nian.  If negative, recommend outpatient Cardiology follow up.     Final Clinical Impression(s) / ED Diagnoses Final diagnoses:  Nonspecific chest pain    Rx / DC Orders ED Discharge Orders    None       Gareth Morgan, MD 11/21/20 1520

## 2020-11-21 NOTE — ED Notes (Signed)
Lab phoned to inform of blood work to be done, spoke with Arbie Cookey

## 2020-11-24 ENCOUNTER — Other Ambulatory Visit (HOSPITAL_COMMUNITY): Payer: Self-pay | Admitting: Psychiatry

## 2020-11-24 ENCOUNTER — Other Ambulatory Visit: Payer: Self-pay | Admitting: Family Medicine

## 2020-11-24 DIAGNOSIS — E1165 Type 2 diabetes mellitus with hyperglycemia: Secondary | ICD-10-CM

## 2020-11-24 DIAGNOSIS — F411 Generalized anxiety disorder: Secondary | ICD-10-CM

## 2020-11-25 NOTE — Telephone Encounter (Signed)
Refill 3 MG or increase to 7 MG? Please advise

## 2020-12-23 ENCOUNTER — Ambulatory Visit: Payer: BC Managed Care – PPO | Admitting: Internal Medicine

## 2020-12-23 NOTE — Progress Notes (Deleted)
Name: Cheryl [name]  MRN/ DOB: [id number], 1975/09/13    Age/ Sex: 45 y.o., female    PCP: Carollee Herter, Alferd Apa, DO   Reason for Endocrinology Evaluation: Hypothyroidism     Date of Initial Endocrinology Evaluation: 12/23/2020     HPI: Cheryl [name] is a 45 y.o. female with a past medical history of T2DM, Hypothyroidism and dyslipidemia . The patient presented for initial endocrinology clinic visit on 12/23/2020 for consultative assistance with her Hypothyroidism.   She has been diagnosed with hypothyroidism      Of note, she also has T2DM, PCP recently added Rybelsus.   HISTORY:  Past Medical History:  Past Medical History:  Diagnosis Date   Anxiety    Bipolar 1 disorder ([place])    Bipolar 2 disorder ([place])    Depression    Diabetes mellitus without complication ([place])    Kidney stone    Migraines    with aura   OSA (obstructive sleep apnea) 10/17/2013   Thyroid disease    Past Surgical History:  Past Surgical History:  Procedure Laterality Date   INTRAUTERINE DEVICE (IUD) INSERTION  10/2011   inserted & removed 2018   NASAL SEPTUM SURGERY     OTHER SURGICAL HISTORY     endometrial polyps (2)   WISDOM TOOTH EXTRACTION      Social History:  reports that she has been smoking cigarettes. She has a 20.00 pack-year smoking history. She has never used smokeless tobacco. She reports that she does not drink alcohol and does not use drugs. Family History: family history includes Alcohol abuse in her paternal grandfather; Aneurysm in her paternal grandfather; COPD in her maternal grandmother; Cancer in her maternal aunt, maternal grandfather, maternal grandmother, and maternal uncle; Cancer (age of onset: 64) in her mother; Depression in her cousin, maternal grandmother, and sister; Diabetes in her father and paternal grandmother; Heart disease in her father; Hyperlipidemia in her father; Hypertension in her father and maternal grandmother; Hypothyroidism  in her mother and sister; Migraines in her father; Stroke in her father, maternal grandmother, and paternal grandmother; Stroke (age of onset: 69) in her mother.   HOME MEDICATIONS: Allergies as of 12/23/2020       Reactions   Augmentin [amoxicillin-pot Clavulanate] Diarrhea   Food Itching   Bison-throat tingling        Medication List        Accurate as of December 23, 2020 12:33 PM. If you have any questions, ask your nurse or doctor.          ARIPiprazole 5 MG tablet Commonly known as: Abilify Take 1 tablet (5 mg total) by mouth daily.   benztropine 0.5 MG tablet Commonly known as: COGENTIN Take 1 tablet (0.5 mg total) by mouth daily.   gabapentin 100 MG capsule Commonly known as: NEURONTIN Take 2 capsules (200 mg total) by mouth at bedtime.   ibuprofen 200 MG tablet Commonly known as: ADVIL Take 400 mg by mouth every 6 (six) hours as needed.   lamoTRIgine 150 MG tablet Commonly known as: LAMICTAL Take 1 tablet (150 mg total) by mouth 2 (two) times daily.   levothyroxine 200 MCG tablet Commonly known as: SYNTHROID Take 1 tablet (200 mcg total) by mouth daily.   levothyroxine 175 MCG tablet Commonly known as: SYNTHROID TAKE 1 TABLET BY MOUTH EVERY MORNING ON AN EMPTY STOMACH   metFORMIN 500 MG 24 hr tablet Commonly known as: GLUCOPHAGE-XR TAKE 2 TABLETS BY MOUTH DAILY   moxifloxacin  0.5 % ophthalmic solution Commonly known as: Vigamox Place 1 drop into the right eye 3 (three) times daily.   naproxen sodium 220 MG tablet Commonly known as: ALEVE Take 220 mg by mouth 2 (two) times daily as needed (pain).   OneTouch Delica Lancets Fine Misc As directed qd   OneTouch Verio test strip Generic drug: glucose blood Use to check blood sugar once a day. DX E11.51, E11.65   rizatriptan 10 MG disintegrating tablet Commonly known as: MAXALT-MLT DISSOLVE ONE TABLET IN THE MOUTH DAILY AS NEEDED FOR MIGRAINE. MAY REPEAT IN 2 HOURS IF NEEDED.   rosuvastatin 10 MG  tablet Commonly known as: CRESTOR Take 1 tablet (10 mg total) by mouth daily.   Rybelsus 7 MG Tabs Generic drug: Semaglutide Take 7 mg by mouth daily.   sertraline 100 MG tablet Commonly known as: ZOLOFT Take 2 tablets (200 mg total) by mouth daily.          REVIEW OF SYSTEMS: A comprehensive ROS was conducted with the patient and is negative except as per HPI and below:  ROS     OBJECTIVE:  VS: There were no vitals taken for this visit.   Wt Readings from Last 3 Encounters:  11/21/20 226 lb (102.5 kg)  10/23/20 231 lb 3.2 oz (104.9 kg)  04/10/20 230 lb (104.3 kg)     EXAM: General: Pt appears well and is in NAD  Hydration: Well-hydrated with moist mucous membranes and good skin turgor  Eyes: External eye exam normal without stare, lid lag or exophthalmos.  EOM intact.  PERRL.  Ears, Nose, Throat: Hearing: Grossly intact bilaterally Dental: Good dentition  Throat: Clear without mass, erythema or exudate  Neck: General: Supple without adenopathy. Thyroid: Thyroid size normal.  No goiter or nodules appreciated. No thyroid bruit.  Lungs: Clear with good BS bilat with no rales, rhonchi, or wheezes  Heart: Auscultation: RRR.  Abdomen: Normoactive bowel sounds, soft, nontender, without masses or organomegaly palpable  Extremities: Gait and station: Normal gait  Digits and nails: No clubbing, cyanosis, petechiae, or nodes Head and neck: Normal alignment and mobility BL UE: Normal ROM and strength. BL LE: No pretibial edema normal ROM and strength.  Skin: Hair: Texture and amount normal with gender appropriate distribution Skin Inspection: No rashes, acanthosis nigricans/skin tags. No lipohypertrophy Skin Palpation: Skin temperature, texture, and thickness normal to palpation  Neuro: Cranial nerves: II - XII grossly intact  Cerebellar: Normal coordination and movement; no tremor Motor: Normal strength throughout DTRs: 2+ and symmetric in UE without delay in relaxation  phase  Mental Status: Judgment, insight: Intact Orientation: Oriented to time, place, and person Memory: Intact for recent and remote events Mood and affect: No depression, anxiety, or agitation     DATA REVIEWED: ***    ASSESSMENT/PLAN/RECOMMENDATIONS:   ***    Medications :  Signed electronically by: Mack Guise, MD  [facility] Endocrinology  [facility] [address] [place], [place] [address] Phone: [phone] FAX: [phone]   CC: Ann Held, DO [address] [place] [address] Phone: [phone] Fax: [phone]   Return to Endocrinology clinic as below: Future Appointments  Date Time Provider Ali Chukson  12/23/2020  2:40 PM Nayanna Seaborn, Melanie Crazier, MD [facility]  01/06/2021  3:40 PM Arfeen, Arlyce Harman, MD [facility] None  04/21/2021  5:40 PM Ann Held, DO [facility]

## 2020-12-31 ENCOUNTER — Other Ambulatory Visit: Payer: Self-pay | Admitting: Family Medicine

## 2020-12-31 DIAGNOSIS — E1165 Type 2 diabetes mellitus with hyperglycemia: Secondary | ICD-10-CM

## 2021-01-06 ENCOUNTER — Encounter (HOSPITAL_COMMUNITY): Payer: Self-pay | Admitting: Psychiatry

## 2021-01-06 ENCOUNTER — Telehealth (INDEPENDENT_AMBULATORY_CARE_PROVIDER_SITE_OTHER): Payer: BC Managed Care – PPO | Admitting: Psychiatry

## 2021-01-06 ENCOUNTER — Other Ambulatory Visit: Payer: Self-pay

## 2021-01-06 DIAGNOSIS — F419 Anxiety disorder, unspecified: Secondary | ICD-10-CM

## 2021-01-06 DIAGNOSIS — F3181 Bipolar II disorder: Secondary | ICD-10-CM

## 2021-01-06 MED ORDER — ARIPIPRAZOLE 5 MG PO TABS: 5.0000 mg | ORAL_TABLET | Freq: Every day | ORAL | 2 refills | Status: DC

## 2021-01-06 MED ORDER — BENZTROPINE MESYLATE 0.5 MG PO TABS: 0.5000 mg | ORAL_TABLET | Freq: Every day | ORAL | 2 refills | Status: DC

## 2021-01-06 MED ORDER — SERTRALINE HCL 100 MG PO TABS: 200.0000 mg | ORAL_TABLET | Freq: Every day | ORAL | 2 refills | Status: DC

## 2021-01-06 MED ORDER — LAMOTRIGINE 150 MG PO TABS: 150.0000 mg | ORAL_TABLET | Freq: Two times a day (BID) | ORAL | 2 refills | Status: DC

## 2021-01-06 NOTE — Progress Notes (Signed)
Virtual Visit via Telephone Note  I connected with Cheryl [name] on 01/06/21 at  3:40 PM EDT by telephone and verified that I am speaking with the correct person using two identifiers.  Location: Patient: Home Provider: Home Office   I discussed the limitations, risks, security and privacy concerns of performing an evaluation and management service by telephone and the availability of in person appointments. I also discussed with the patient that there may be a patient responsible charge related to this service. The patient expressed understanding and agreed to proceed.   History of Present Illness: Patient is evaluated by phone session.  She is taking Abilify and now taking 5 mg.  She noticed improvement in her motivation, energy however by the time she gets home she gets tired.  Patient told her commute as long as she is working in person.  She like her job and new boss.  She denies any mania, anger, crying spells or any feeling of hopelessness or worthlessness.  She has no tremors, shakes or any EPS.  She wants to keep the current medication.  She denies any panic attack.  She has no rash or any itching.  She lost few pounds since the last visit and she is happy about it.  Her appetite is okay.  She is sleeping good.     Past Psychiatric History:  H/O depression since college at [employer] [facility]. Tried Prozac, wellbutrin and Effexor. Did not like Prozac and withdrawal from Effexor.  H/O mania, anger and mood swings.  No h/o inpatient treatment, suicidal attempt.  Saw Jobie Quaker at tried psychiatry but not happy with the services.   Psychiatric Specialty Exam: Physical Exam  Review of Systems  Weight 221 lb (100.2 kg).There is no height or weight on file to calculate BMI.  General Appearance: NA  Eye Contact:  NA  Speech:  Clear and Coherent  Volume:  Normal  Mood:  Euthymic  Affect:  NA  Thought Process:  Goal Directed  Orientation:  Full (Time, Place, and Person)   Thought Content:  WDL  Suicidal Thoughts:  No  Homicidal Thoughts:  No  Memory:  Immediate;   Good Recent;   Good Remote;   Good  Judgement:  Intact  Insight:  Present  Psychomotor Activity:  NA  Concentration:  Concentration: Good and Attention Span: Good  Recall:  Good  Fund of Knowledge:  Good  Language:  Good  Akathisia:  No  Handed:  Right  AIMS (if indicated):     Assets:  Communication Skills Desire for Improvement Housing Resilience Social Support Transportation  ADL's:  Intact  Cognition:  WNL  Sleep:   ok      Assessment and Plan: Bipolar disorder type II.  Anxiety.  Patient doing better with increased dose of Abilify.  So far she is tolerating well and reported no tremor or shakes or any EPS.  Continue Abilify 5 mg daily, Zoloft 200 mg daily, Lamictal 150 mg twice a day and Cogentin 0.5 mg daily.  We have talked about reducing the Lamictal levels but patient does not feel at this time wants to change as things are going well.  Recommended to call us back if she has any question or any concern.  Follow-up in 3 months.  Follow Up Instructions:    I discussed the assessment and treatment plan with the patient. The patient was provided an opportunity to ask questions and all were answered. The patient agreed with the plan and demonstrated an understanding  of the instructions.   The patient was advised to call back or seek an in-person evaluation if the symptoms worsen or if the condition fails to improve as anticipated.  I provided 16 minutes of non-face-to-face time during this encounter.   Kathlee Nations, MD

## 2021-01-13 ENCOUNTER — Other Ambulatory Visit: Payer: Self-pay | Admitting: Family Medicine

## 2021-01-21 ENCOUNTER — Other Ambulatory Visit: Payer: Self-pay | Admitting: Family Medicine

## 2021-01-21 NOTE — Telephone Encounter (Signed)
Requesting: Klonopin Contract: N/A UDS: N/A Last OV: 10/23/20 Next OV: 04/21/21 Last Refill: 07/27/2019, #30--0 RF Database:   Please advise

## 2021-02-18 ENCOUNTER — Other Ambulatory Visit: Payer: Self-pay | Admitting: Family Medicine

## 2021-03-20 ENCOUNTER — Other Ambulatory Visit: Payer: Self-pay | Admitting: Family Medicine

## 2021-03-23 NOTE — Telephone Encounter (Signed)
Does Pt need to increase to Rybelsus 7mg ?

## 2021-04-09 ENCOUNTER — Other Ambulatory Visit: Payer: Self-pay

## 2021-04-09 ENCOUNTER — Encounter (HOSPITAL_COMMUNITY): Payer: Self-pay | Admitting: Psychiatry

## 2021-04-09 ENCOUNTER — Telehealth (HOSPITAL_BASED_OUTPATIENT_CLINIC_OR_DEPARTMENT_OTHER): Payer: BC Managed Care – PPO | Admitting: Psychiatry

## 2021-04-09 DIAGNOSIS — F419 Anxiety disorder, unspecified: Secondary | ICD-10-CM | POA: Diagnosis not present

## 2021-04-09 DIAGNOSIS — F3181 Bipolar II disorder: Secondary | ICD-10-CM

## 2021-04-09 MED ORDER — ARIPIPRAZOLE 5 MG PO TABS: 5.0000 mg | ORAL_TABLET | Freq: Every day | ORAL | 2 refills | Status: DC

## 2021-04-09 MED ORDER — BENZTROPINE MESYLATE 0.5 MG PO TABS: 0.5000 mg | ORAL_TABLET | Freq: Every day | ORAL | 2 refills | Status: DC

## 2021-04-09 MED ORDER — LAMOTRIGINE 150 MG PO TABS: 150.0000 mg | ORAL_TABLET | Freq: Two times a day (BID) | ORAL | 2 refills | Status: DC

## 2021-04-09 MED ORDER — SERTRALINE HCL 100 MG PO TABS: 200.0000 mg | ORAL_TABLET | Freq: Every day | ORAL | 2 refills | Status: DC

## 2021-04-09 NOTE — Progress Notes (Signed)
Virtual Visit via Telephone Note  I connected with Cheryl [name] on 04/09/21 at  3:40 PM EDT by telephone and verified that I am speaking with the correct person using two identifiers.  Location: Patient: Office Provider: Office   I discussed the limitations, risks, security and privacy concerns of performing an evaluation and management service by telephone and the availability of in person appointments. I also discussed with the patient that there may be a patient responsible charge related to this service. The patient expressed understanding and agreed to proceed.   History of Present Illness: Patient is evaluated by phone session.  She is back work.  She admitted lately noticed increase feeling of tiredness, fatigue with lack of energy and sleeping too much.  She is not sure what causing it but she is scheduled with the PCP and also going to see the endocrinologist.  Her last hemoglobin A1c was 8.2 which is higher than before.  She believes it could be her thyroid as she feels sometimes sluggish.  However she denies any mania, psychosis, hallucination, crying spells or any feeling of hopelessness.  She has no tremors or shakes.  She is not sure if she had gained weight but her appetite is okay.  She denies any panic attack or any suicidal thoughts.  Her job is going well and she likes her new boss.  She is working in person.  Past Psychiatric History:  H/O depression since college at [employer] [facility]. Tried Prozac, wellbutrin and Effexor. Did not like Prozac and withdrawal from Effexor.  H/O mania, anger and mood swings.  No h/o inpatient treatment, suicidal attempt.  Saw Jobie Quaker at tried psychiatry but not happy with the services.  Psychiatric Specialty Exam: Physical Exam  Review of Systems  There were no vitals taken for this visit.There is no height or weight on file to calculate BMI.  General Appearance: NA  Eye Contact:  NA  Speech:  Clear and Coherent  Volume:   Normal  Mood:   tired  Affect:  NA  Thought Process:  Goal Directed  Orientation:  Full (Time, Place, and Person)  Thought Content:  WDL  Suicidal Thoughts:  No  Homicidal Thoughts:  No  Memory:  Immediate;   Good Recent;   Good Remote;   Good  Judgement:  Intact  Insight:  Present  Psychomotor Activity:  NA  Concentration:  Concentration: Fair and Attention Span: Fair  Recall:  Good  Fund of Knowledge:  Good  Language:  Good  Akathisia:  No  Handed:  Right  AIMS (if indicated):     Assets:  Communication Skills Desire for Improvement Housing Resilience Transportation  ADL's:  Intact  Cognition:  WNL  Sleep:   too much      Assessment and Plan: Bipolar disorder type II.  Anxiety.  Discuss her fatigue, tiredness.  Encourage a keep appointment with PCP and blood work.  I also discussed Abilify can cause high blood sugar and if her hemoglobin A1c remains high then we may need to consider switching to a different medication.  Patient reluctant to cut down since she is stable on her mood but will consider if need to switch to other medication after the visit with the PCP and endocrinologist.  For now continue Abilify 5 mg daily, Zoloft 200 mg daily, Lamictal 150 mg twice a day and Cogentin 0.5 mg daily.  Recommended to call us back if she is any question or any concern.  Follow-up in 3 months.  Follow Up Instructions:    I discussed the assessment and treatment plan with the patient. The patient was provided an opportunity to ask questions and all were answered. The patient agreed with the plan and demonstrated an understanding of the instructions.   The patient was advised to call back or seek an in-person evaluation if the symptoms worsen or if the condition fails to improve as anticipated.  I provided 18 minutes of non-face-to-face time during this encounter.

## 2021-04-21 ENCOUNTER — Encounter: Payer: BC Managed Care – PPO | Admitting: Family Medicine

## 2021-04-23 ENCOUNTER — Other Ambulatory Visit: Payer: Self-pay | Admitting: Family Medicine

## 2021-04-23 DIAGNOSIS — E1165 Type 2 diabetes mellitus with hyperglycemia: Secondary | ICD-10-CM

## 2021-04-28 ENCOUNTER — Other Ambulatory Visit: Payer: Self-pay

## 2021-04-28 ENCOUNTER — Ambulatory Visit: Payer: BC Managed Care – PPO | Admitting: Internal Medicine

## 2021-04-28 ENCOUNTER — Encounter: Payer: Self-pay | Admitting: Internal Medicine

## 2021-04-28 VITALS — BP 110/72 | HR 76 | Ht 66.0 in | Wt 224.0 lb

## 2021-04-28 DIAGNOSIS — E1165 Type 2 diabetes mellitus with hyperglycemia: Secondary | ICD-10-CM | POA: Diagnosis not present

## 2021-04-28 DIAGNOSIS — E039 Hypothyroidism, unspecified: Secondary | ICD-10-CM | POA: Diagnosis not present

## 2021-04-28 LAB — POCT GLYCOSYLATED HEMOGLOBIN (HGB A1C): Hemoglobin A1C: 8.7 % — AB (ref 4.0–5.6)

## 2021-04-28 MED ORDER — RYBELSUS 14 MG PO TABS: 14.0000 mg | ORAL_TABLET | Freq: Every day | ORAL | 6 refills | Status: DC

## 2021-04-28 MED ORDER — ONETOUCH VERIO VI STRP: 1.0000 | ORAL_STRIP | Freq: Every day | 3 refills | Status: DC

## 2021-04-28 MED ORDER — METFORMIN HCL ER 500 MG PO TB24: 1000.0000 mg | ORAL_TABLET | Freq: Every day | ORAL | 3 refills | Status: DC

## 2021-04-28 NOTE — Patient Instructions (Signed)
Continue Metformin 500 mg , 2 tablets in the morning  Increase Rybelsus to 14 mg  daily    You are on levothyroxine - which is your thyroid hormone supplement. You MUST take this consistently.  You should take this first thing in the morning on an empty stomach with water. You should not take it with other medications. Wait 79min to 1hr prior to eating. If you are taking any vitamins - please take these in the evening.   If you miss a dose, please take your missed dose the following day (double the dose for that day). You should have a pill box for ONLY levothyroxine on your bedside table to help you remember to take your medications.

## 2021-04-28 NOTE — Progress Notes (Signed)
Name: Cheryl [name]  MRN/ DOB: [id number], [birth year]    Age/ Sex: 45 y.o., female    PCP: Cheryl [name], Cheryl Apa, DO   Reason for Endocrinology Evaluation: Hypothyroidism      Date of Initial Endocrinology Evaluation: 04/28/2021     HPI: [name] is a 45 y.o. female with a past medical history of T2DM, Bipolar disorder , Dyslipidemia and T2DM . The patient presented for initial endocrinology clinic visit on 04/28/2021 for consultative assistance with her Hypothyroidism .   THYROID HISTORY : She has been diagnosed with hypothyroidism years ago . Over the years her TSH fluctuates between 3 and 66 uIU/mL.  She does not use a pill box.    Mother and sister with thyroid disease     DIABETES HISTORY: [name] was diagnosed with DM 2017, she has been on metformin since her diagnosis, Rybelsus was started in 10/2020 . Her hemoglobin A1c has ranged from 5.8% in 2018, peaking at 8.2% in 2022.    HOME ENDOCRINE MEDICATIONS : Levothyroxine 200 mcg daily  Metformin 500 mg XR 2 tabs daily with Breakfast  Rybelsus 7 mg daily    Denies  nausea or vomiting, but has intermittent diarrhea  Denies prior hx of pancreatitis  Does not check glucose at home  Avoids sugar- sweetened beverages  Neck feels tight , she is not sure  if this is related to weight gain  NO MVI or Biotin intake  Has forgetfulness   She is having issues with GI upset and may be attributes to Metformin     HISTORY:  Past Medical History:  Past Medical History:  Diagnosis Date   Anxiety    Bipolar 1 disorder ([place])    Bipolar 2 disorder ([place])    Depression    Diabetes mellitus without complication ([place])    Kidney stone    Migraines    with aura   OSA (obstructive sleep apnea) 10/17/2013   Thyroid disease    Past Surgical History:  Past Surgical History:  Procedure Laterality Date   INTRAUTERINE DEVICE (IUD) INSERTION  10/2011   inserted & removed 2018   NASAL  SEPTUM SURGERY     OTHER SURGICAL HISTORY     endometrial polyps (2)   WISDOM TOOTH EXTRACTION      Social History:  reports that she has been smoking cigarettes. She has a 20.00 pack-year smoking history. She has never used smokeless tobacco. She reports that she does not drink alcohol and does not use drugs. Family History: family history includes Alcohol abuse in her paternal grandfather; Aneurysm in her paternal grandfather; COPD in her maternal grandmother; Cancer in her maternal aunt, maternal grandfather, maternal grandmother, and maternal uncle; Cancer (age of onset: 59) in her mother; Depression in her cousin, maternal grandmother, and sister; Diabetes in her father and paternal grandmother; Heart disease in her father; Hyperlipidemia in her father; Hypertension in her father and maternal grandmother; Hypothyroidism in her mother and sister; Migraines in her father; Stroke in her father, maternal grandmother, and paternal grandmother; Stroke (age of onset: 56) in her mother.   HOME MEDICATIONS: Allergies as of 04/28/2021       Reactions   Augmentin [amoxicillin-pot Clavulanate] Diarrhea   Food Itching   Bison-throat tingling        Medication List        Accurate as of April 28, 2021  2:48 PM. If you have any questions, ask your nurse or doctor.  STOP taking these medications    gabapentin 100 MG capsule Commonly known as: NEURONTIN Stopped by: Dorita Sciara, MD   moxifloxacin 0.5 % ophthalmic solution Commonly known as: Vigamox Stopped by: Dorita Sciara, MD       TAKE these medications    ARIPiprazole 5 MG tablet Commonly known as: Abilify Take 1 tablet (5 mg total) by mouth daily.   benztropine 0.5 MG tablet Commonly known as: COGENTIN Take 1 tablet (0.5 mg total) by mouth daily.   clonazePAM 0.5 MG tablet Commonly known as: KLONOPIN TAKE ONE (1) TABLET BY MOUTH EVERY DAY AS NEEDED FOR ANXIETY   ibuprofen 200 MG  tablet Commonly known as: ADVIL Take 400 mg by mouth every 6 (six) hours as needed.   lamoTRIgine 150 MG tablet Commonly known as: LAMICTAL Take 1 tablet (150 mg total) by mouth 2 (two) times daily.   levothyroxine 200 MCG tablet Commonly known as: SYNTHROID TAKE ONE (1) TABLET BY MOUTH EVERY DAY   metFORMIN 500 MG 24 hr tablet Commonly known as: GLUCOPHAGE-XR TAKE TWO (2) TABLETS BY MOUTH EVERY MORNING WITH BREAKFAST   naproxen sodium 220 MG tablet Commonly known as: ALEVE Take 220 mg by mouth 2 (two) times daily as needed (pain).   OneTouch Delica Lancets Fine Misc As directed qd   OneTouch Verio test strip Generic drug: glucose blood Use to check blood sugar once a day. DX E11.51, E11.65   rizatriptan 10 MG disintegrating tablet Commonly known as: MAXALT-MLT DISSOLVE ONE TABLET IN THE MOUTH DAILY AS NEEDED FOR MIGRAINE. MAY REPEAT IN 2 HOURS IF NEEDED.   rosuvastatin 10 MG tablet Commonly known as: CRESTOR Take 1 tablet (10 mg total) by mouth daily.   Rybelsus 7 MG Tabs Generic drug: Semaglutide Take 7 mg by mouth daily.   Rybelsus 3 MG Tabs Generic drug: Semaglutide TAKE ONE (1) TABLET BY MOUTH EACH DAY   sertraline 100 MG tablet Commonly known as: ZOLOFT Take 2 tablets (200 mg total) by mouth daily.          REVIEW OF SYSTEMS: A comprehensive ROS was conducted with the patient and is negative except as per HPI   OBJECTIVE:  VS: BP 110/72 (BP Location: Left Arm, Patient Position: Sitting, Cuff Size: Small)   Pulse 76   Ht 5\' 6"  (1.676 m)   Wt 224 lb (101.6 kg)   SpO2 96%   BMI 36.15 kg/m    Wt Readings from Last 3 Encounters:  04/28/21 224 lb (101.6 kg)  11/21/20 226 lb (102.5 kg)  10/23/20 231 lb 3.2 oz (104.9 kg)     EXAM: General: Pt appears well and is in NAD  Neck: General: Supple without adenopathy. Thyroid: Thyroid size normal.  No goiter or nodules appreciated.  Lungs: Clear with good BS bilat with no rales, rhonchi, or wheezes   Heart: Auscultation: RRR.  Abdomen: Normoactive bowel sounds, soft, nontender, without masses or organomegaly palpable  Extremities:  BL LE: No pretibial edema normal ROM and strength.  Skin: Hair: Texture and amount normal with gender appropriate distribution Skin Inspection: No rashes Skin Palpation: Skin temperature, texture, and thickness normal to palpation  Mental Status: Judgment, insight: Intact Orientation: Oriented to time, place, and person Mood and affect: No depression, anxiety, or agitation     DATA REVIEWED: Results for ANAELI, CORNWALL (MRN [id number]) as of 04/29/2021 11:43  Ref. Range 04/28/2021 15:17  TSH Latest Ref Range: 0.35 - 5.50 uIU/mL 1.22      ASSESSMENT/PLAN/RECOMMENDATIONS:  Hypothyroidism  - Pt is clinically euthyroid  - Long history of medication non-adherence  - Pt educated extensively on the correct way to take levothyroxine (first thing in the morning with water, 30 minutes before eating or taking other medications). - Pt encouraged to double dose the following day if she were to miss a dose given long half-life of levothyroxine. - TSH today is normal  Medications : Continue levothyroxine 200 mcg    2.Type 2 Diabetes Mellitus, Poorly controlled - Most recent A1c of 8.7 %. Goal A1c < 7.0 %.     - We discussed importance of low carbohydrate diet and walking exercise  - She already avoids sugar-sweetened beverages  - She has GI symptoms and  she is not sure if this was due to Metformin or Rybelsus  - We discussed adding SGLT-2 inhibitors as this will not have GI side effects but she declined after discussed risk of genital infections, we also discussed SU but she is not keen on weight gain  - We eventually opted for increasing Rybelsus while she monitors GI symptoms   Medication  INcrease Rybelsus 14 mg daily  Continue Metformin 500 mg , 2 tabs in the morning    F/U in 4 months    Signed electronically by: Mack Guise, MD  [facility]  [address] [place],  [address] Phone: [phone] FAX: [phone]   CC: Ann Held, DO [address] [place] [address] Phone: [phone] Fax: [phone]   Return to Endocrinology clinic as below: Future Appointments  Date Time Provider [place]  07/08/2021  3:40 PM Arfeen, Arlyce Harman, MD [facility] None

## 2021-04-29 LAB — TSH: TSH: 1.22 u[IU]/mL (ref 0.35–5.50)

## 2021-04-29 MED ORDER — LEVOTHYROXINE SODIUM 200 MCG PO TABS: 200.0000 ug | ORAL_TABLET | Freq: Every day | ORAL | 3 refills | Status: DC

## 2021-05-11 ENCOUNTER — Telehealth: Payer: Self-pay

## 2021-05-11 DIAGNOSIS — Z006 Encounter for examination for normal comparison and control in clinical research program: Secondary | ICD-10-CM

## 2021-05-11 NOTE — Telephone Encounter (Signed)
I called to speak with pt about participating in our CORE trail. Pt did not answer. I left a message with my name, contact information and the nature of my call.

## 2021-05-26 ENCOUNTER — Telehealth (INDEPENDENT_AMBULATORY_CARE_PROVIDER_SITE_OTHER): Payer: BC Managed Care – PPO | Admitting: Family Medicine

## 2021-05-26 ENCOUNTER — Encounter: Payer: Self-pay | Admitting: Family Medicine

## 2021-05-26 DIAGNOSIS — U071 COVID-19: Secondary | ICD-10-CM | POA: Diagnosis not present

## 2021-05-26 MED ORDER — MOLNUPIRAVIR EUA 200MG CAPSULE: 4.0000 | ORAL_CAPSULE | Freq: Two times a day (BID) | ORAL | 0 refills | Status: AC

## 2021-05-26 NOTE — Progress Notes (Signed)
Chief Complaint  Patient presents with   Covid Exposure   Cough    Head congestion     Cheryl [name] here for URI complaints. Due to COVID-19 pandemic, we are interacting via web portal for an electronic face-to-face visit. I verified patient's ID using 2 identifiers. Patient agreed to proceed with visit via this method. Patient is at home, I am at office. Patient and I are present for visit.   Duration: 4 days  Associated symptoms: subj fever, sinus headache, sinus congestion, sinus pain, rhinorrhea, itchy watery eyes, wheezing, myalgia, and coughing, N/V/D,  Denies: ear pain, ear drainage, sore throat, shortness of breath, and loss of taste/smell Treatment to date: Dayquil/Nyquil Sick contacts: No Spouse tested + for covid.   Past Medical History:  Diagnosis Date   Anxiety    Bipolar 1 disorder ([place])    Bipolar 2 disorder ([place])    Depression    Diabetes mellitus without complication ([place])    Kidney stone    Migraines    with aura   OSA (obstructive sleep apnea) 10/17/2013   Thyroid disease     Objective No conversational dyspnea Age appropriate judgment and insight Nml affect and mood  COVID-19 - Plan: molnupiravir EUA (LAGEVRIO) 200 mg CAPS capsule  DM II, obesity, smoking; will send in antiviral. Politely declined symptomatic management.  Discussed CDC quarantining guidelines. Continue to push fluids, practice good hand hygiene, cover mouth when coughing. F/u prn. If starting to experience irreplaceable fluid loss, shaking, or shortness of breath, seek immediate care. Pt voiced understanding and agreement to the plan.  [place], DO 05/26/21 1:28 PM

## 2021-05-29 ENCOUNTER — Ambulatory Visit: Payer: BC Managed Care – PPO | Admitting: Family Medicine

## 2021-06-16 ENCOUNTER — Telehealth: Payer: Self-pay | Admitting: *Deleted

## 2021-06-16 NOTE — Telephone Encounter (Signed)
Message left on voicemail about CORE research.

## 2021-07-08 ENCOUNTER — Encounter (HOSPITAL_COMMUNITY): Payer: Self-pay | Admitting: Psychiatry

## 2021-07-08 ENCOUNTER — Telehealth (HOSPITAL_BASED_OUTPATIENT_CLINIC_OR_DEPARTMENT_OTHER): Payer: BC Managed Care – PPO | Admitting: Psychiatry

## 2021-07-08 ENCOUNTER — Other Ambulatory Visit: Payer: Self-pay

## 2021-07-08 DIAGNOSIS — F3181 Bipolar II disorder: Secondary | ICD-10-CM | POA: Diagnosis not present

## 2021-07-08 DIAGNOSIS — F419 Anxiety disorder, unspecified: Secondary | ICD-10-CM

## 2021-07-08 MED ORDER — BENZTROPINE MESYLATE 0.5 MG PO TABS: 0.5000 mg | ORAL_TABLET | Freq: Every day | ORAL | 2 refills | Status: DC

## 2021-07-08 MED ORDER — ARIPIPRAZOLE 5 MG PO TABS: 5.0000 mg | ORAL_TABLET | Freq: Every day | ORAL | 2 refills | Status: DC

## 2021-07-08 MED ORDER — LAMOTRIGINE 150 MG PO TABS: 150.0000 mg | ORAL_TABLET | Freq: Two times a day (BID) | ORAL | 2 refills | Status: DC

## 2021-07-08 MED ORDER — SERTRALINE HCL 100 MG PO TABS: 200.0000 mg | ORAL_TABLET | Freq: Every day | ORAL | 2 refills | Status: DC

## 2021-07-08 NOTE — Progress Notes (Signed)
Virtual Visit via Telephone Note  I connected with Cheryl [name] on 07/08/21 at  3:40 PM EST by telephone and verified that I am speaking with the correct person using two identifiers.  Location: Patient: Work Provider: Biomedical scientist   I discussed the limitations, risks, security and privacy concerns of performing an evaluation and management service by telephone and the availability of in person appointments. I also discussed with the patient that there may be a patient responsible charge related to this service. The patient expressed understanding and agreed to proceed.   History of Present Illness: Patient is evaluated by phone session.  She endorsed job is overwhelming and stressful but manageable.  Overall she feels things are going well.  She reported Christmas was okay but family member got COVID and flu.  She is sleeping good.  Recently she had a blood work and her hemoglobin A1c 8.7 which is increased from the past.  She admitted not watching her calorie and not checking her blood sugar.  She reported have not received glucose strips to check but going to call the PCP so she can start checking blood sugar regularly.  Patient also reported today that she is thinking to get pregnant and like to have a baby.  This will be her first pregnancy as she never had any children before.  She is married to her husband for 4 years.  She reported things are going very well.  She denies any mania, psychosis, hallucination or any crying spells.  Her appetite is okay.  She denies any major panic attack.  She denies any impulsive behavior or any mood swings.  She is compliant with Zoloft, Abilify, Lamictal and Cogentin.  She rarely takes Klonopin which is given by her PCP.    Past Psychiatric History:  H/O depression since college at [employer]. Tried Prozac, wellbutrin and Effexor. Did not like Prozac and withdrawal from Effexor.  H/O mania, anger and mood swings.  No h/o inpatient treatment,  suicidal attempt.  Saw Jobie Quaker at tried psychiatry but not happy with the services.  Recent Results (from the past 2160 hour(s))  POCT glycosylated hemoglobin (Hb A1C)     Status: Abnormal   Collection Time: 04/28/21  2:54 PM  Result Value Ref Range   Hemoglobin A1C 8.7 (A) 4.0 - 5.6 %   HbA1c POC (<> result, manual entry)     HbA1c, POC (prediabetic range)     HbA1c, POC (controlled diabetic range)    TSH     Status: None   Collection Time: 04/28/21  3:17 PM  Result Value Ref Range   TSH 1.22 0.35 - 5.50 uIU/mL     Psychiatric Specialty Exam: Physical Exam  Review of Systems  Weight 224 lb (101.6 kg).There is no height or weight on file to calculate BMI.  General Appearance: NA  Eye Contact:  NA  Speech:  Clear and Coherent and Normal Rate  Volume:  Normal  Mood:  Euthymic  Affect:  NA  Thought Process:  Goal Directed  Orientation:  Full (Time, Place, and Person)  Thought Content:  Rumination  Suicidal Thoughts:  No  Homicidal Thoughts:  No  Memory:  Immediate;   Good Recent;   Fair Remote;   Fair  Judgement:  Intact  Insight:  Present  Psychomotor Activity:  NA  Concentration:  Concentration: Fair and Attention Span: Fair  Recall:  [employer] of Knowledge:  Good  Language:  Good  Akathisia:  No  Handed:  Right  AIMS (if indicated):     Assets:  Communication Skills Desire for Improvement Housing Resilience Talents/Skills Transportation  ADL's:  Intact  Cognition:  WNL  Sleep:   ok      Assessment and Plan: Bipolar disorder type II.  Anxiety.  I reviewed current medication and blood work results.  Her hemoglobin A1c is higher than before.  Had a long discussion about the patient's decision to get pregnant.  The patient is not sure as she has upcoming visit to [place] to see her sister and she does not want to get pregnant until then.  She is not sure when this stressful happened.  However we did talk about medication side effects especially not safe in  pregnancy.  I explained she may need to come off from the medication if she ever decided to get pregnant.  She asked what other possibilities and I replied that she can consider therapy and if needed we will lower dose of Zoloft.  However Abilify and Lamictal may not be safe in the pregnancy.  I also discussed care should be collaborated with the OB/GYN if she decided to get pregnancy.  I also recommend she should consider lowering the dose of Abilify since her blood sugar is high but patient promised that she will check her blood sugar every day and she had a plan to decide going to gym to have a better control on her weight.  She is very reluctant to cut down her medication at this time.  She promised if she thought about getting pregnant and she will call us before.  I also explained ideally she should be without psychotropic medication if she is thinking about pregnancy.  She agreed and acknowledged.  For now she like to keep the current dosage.  Follow-up in 3 months.  Continue Abilify 5 mg daily, Zoloft 200 mg daily, Lamictal 150 mg twice a day and Cogentin 0.5 mg daily.  She has no rash or any itching.  Follow Up Instructions:    I discussed the assessment and treatment plan with the patient. The patient was provided an opportunity to ask questions and all were answered. The patient agreed with the plan and demonstrated an understanding of the instructions.   The patient was advised to call back or seek an in-person evaluation if the symptoms worsen or if the condition fails to improve as anticipated.  I provided 21 minutes of non-face-to-face time during this encounter.   Kathlee Nations, MD

## 2021-08-15 ENCOUNTER — Telehealth: Payer: BC Managed Care – PPO | Admitting: Physician Assistant

## 2021-08-15 ENCOUNTER — Encounter: Payer: Self-pay | Admitting: Physician Assistant

## 2021-08-15 DIAGNOSIS — E1165 Type 2 diabetes mellitus with hyperglycemia: Secondary | ICD-10-CM | POA: Diagnosis not present

## 2021-08-15 DIAGNOSIS — J069 Acute upper respiratory infection, unspecified: Secondary | ICD-10-CM | POA: Diagnosis not present

## 2021-08-15 MED ORDER — AZITHROMYCIN 250 MG PO TABS: ORAL_TABLET | ORAL | 0 refills | Status: DC

## 2021-08-15 MED ORDER — BENZONATATE 200 MG PO CAPS: 200.0000 mg | ORAL_CAPSULE | Freq: Two times a day (BID) | ORAL | 0 refills | Status: DC | PRN

## 2021-08-15 MED ORDER — CETIRIZINE HCL 10 MG PO TABS: 10.0000 mg | ORAL_TABLET | Freq: Every day | ORAL | 11 refills | Status: DC

## 2021-08-15 NOTE — Progress Notes (Signed)
Virtual Visit Consent   Cheryl [name], you are scheduled for a virtual visit with a [facility] provider today.     Just as with appointments in the office, your consent must be obtained to participate.  Your consent will be active for this visit and any virtual visit you may have with one of our providers in the next 365 days.     If you have a MyChart account, a copy of this consent can be sent to you electronically.  All virtual visits are billed to your insurance company just like a traditional visit in the office.    As this is a virtual visit, video technology does not allow for your provider to perform a traditional examination.  This may limit your provider's ability to fully assess your condition.  If your provider identifies any concerns that need to be evaluated in person or the need to arrange testing (such as labs, EKG, etc.), we will make arrangements to do so.     Although advances in technology are sophisticated, we cannot ensure that it will always work on either your end or our end.  If the connection with a video visit is poor, the visit may have to be switched to a telephone visit.  With either a video or telephone visit, we are not always able to ensure that we have a secure connection.     I need to obtain your verbal consent now.   Are you willing to proceed with your visit today?    Cheryl [name] has provided verbal consent on 08/15/2021 for a virtual visit (video or telephone).   Kennieth Rad, PA-C   Date: 08/15/2021 10:28 AM   Virtual Visit via Video Note   I, Cheryl [name], connected with  Cheryl [name]  ([id number], Feb 22, 1976) on 08/15/21 at 10:15 AM EST by a video-enabled telemedicine application and verified that I am speaking with the correct person using two identifiers.  Location: Patient: Virtual Visit Location Patient: Home Provider: Virtual Visit Location Provider: Home Office   I discussed the limitations of  evaluation and management by telemedicine and the availability of in person appointments. The patient expressed understanding and agreed to proceed.    History of Present Illness: Cheryl [name] is a 46 y.o. who identifies as a female who was assigned female at birth, states that she has been feeling poorly for the last 4 days, states that she has been having runny nose with clear discharge, body aches, productive cough with occasional brown phlegm, along with sneezing.  States that she feels she has been having some difficulty breathing, states that she has noticed wheezing, states that she has been getting winded just going to the mailbox.  Denies any history of asthma.  Denies nausea, vomiting, diarrhea, reports measured temperature of 99-100, states this morning it was "normal".  States that she is eating and drinking okay.  States that husband has had similar symptoms.  States that she did take a negative home COVID test 2 days ago.  States that she has been trying NyQuil, Mucinex and Tylenol without much relief.  States that she does not have a way to measure her oxygen levels, states that she has been working on improving her diabetes, states that she has had readings of 200 of her blood glucose levels.  Last A1c 3 months ago was 8.7.   HPI: HPI  Problems:  Patient Active Problem List   Diagnosis Date Noted   Uncontrolled type 2 diabetes mellitus  with hyperglycemia ([place]) 10/23/2020   Pars defect of lumbar spine 02/28/2020   Cigarette smoker 10/13/2017    Class: Chronic   Cat bite of left hand including fingers with infection 04/13/2017   Hyperlipidemia LDL goal <70 01/07/2017   Breast mass 11/20/2013   OSA (obstructive sleep apnea) 10/17/2013   DM (diabetes mellitus) type II uncontrolled, periph vascular disorder ([place]) 01/30/2013   RECTAL BLEEDING 09/10/2008   KNEE PAIN, LEFT 04/09/2008   BACK PAIN 04/09/2008   Hypothyroidism 02/17/2007   HIRSUTISM 02/01/2007   ACNE  NEC 02/01/2007   Bipolar II disorder ([place]) 12/20/2006   DERMATITIS, OTHER ATOPIC 12/20/2006    Allergies:  Allergies  Allergen Reactions   Augmentin [Amoxicillin-Pot Clavulanate] Diarrhea   Food Itching    Bison-throat tingling   Medications:  Current Outpatient Medications:    azithromycin (ZITHROMAX) 250 MG tablet, Take 2 tabs PO day 1, then take 1 tab PO once daily, Disp: 6 tablet, Rfl: 0   benzonatate (TESSALON) 200 MG capsule, Take 1 capsule (200 mg total) by mouth 2 (two) times daily as needed for cough., Disp: 20 capsule, Rfl: 0   cetirizine (ZYRTEC) 10 MG tablet, Take 1 tablet (10 mg total) by mouth daily., Disp: 30 tablet, Rfl: 11   ARIPiprazole (ABILIFY) 5 MG tablet, Take 1 tablet (5 mg total) by mouth daily., Disp: 30 tablet, Rfl: 2   benztropine (COGENTIN) 0.5 MG tablet, Take 1 tablet (0.5 mg total) by mouth daily., Disp: 30 tablet, Rfl: 2   clonazePAM (KLONOPIN) 0.5 MG tablet, TAKE ONE (1) TABLET BY MOUTH EVERY DAY AS NEEDED FOR ANXIETY, Disp: 30 tablet, Rfl: 0   ibuprofen (ADVIL,MOTRIN) 200 MG tablet, Take 400 mg by mouth every 6 (six) hours as needed., Disp: , Rfl:    lamoTRIgine (LAMICTAL) 150 MG tablet, Take 1 tablet (150 mg total) by mouth 2 (two) times daily., Disp: 60 tablet, Rfl: 2   levothyroxine (SYNTHROID) 200 MCG tablet, Take 1 tablet (200 mcg total) by mouth daily., Disp: 90 tablet, Rfl: 3   metFORMIN (GLUCOPHAGE-XR) 500 MG 24 hr tablet, Take 2 tablets (1,000 mg total) by mouth daily in the afternoon., Disp: 180 tablet, Rfl: 3   naproxen sodium (ANAPROX) 220 MG tablet, Take 220 mg by mouth 2 (two) times daily as needed (pain)., Disp: , Rfl:    ONETOUCH DELICA LANCETS FINE MISC, As directed qd, Disp: 100 each, Rfl: 1   ONETOUCH VERIO test strip, 1 each by Other route daily in the afternoon. Use to check blood sugar once a day. DX E11.51, E11.65, Disp: 100 each, Rfl: 3   rizatriptan (MAXALT-MLT) 10 MG disintegrating tablet, DISSOLVE ONE TABLET IN THE MOUTH DAILY AS  NEEDED FOR MIGRAINE. MAY REPEAT IN 2 HOURS IF NEEDED., Disp: 10 tablet, Rfl: 0   rosuvastatin (CRESTOR) 10 MG tablet, Take 1 tablet (10 mg total) by mouth daily., Disp: 90 tablet, Rfl: 1   Semaglutide (RYBELSUS) 14 MG TABS, Take 14 mg by mouth daily., Disp: 30 tablet, Rfl: 6   sertraline (ZOLOFT) 100 MG tablet, Take 2 tablets (200 mg total) by mouth daily., Disp: 60 tablet, Rfl: 2  Observations/Objective: Patient is well-developed, well-nourished in no acute distress.  Resting comfortably  at home.  Head is normocephalic, atraumatic.  No labored breathing.  Speech is clear and coherent with logical content.  Patient is alert and oriented at baseline.    Assessment and Plan: 1. URI with cough and congestion - azithromycin (ZITHROMAX) 250 MG tablet; Take 2 tabs PO  day 1, then take 1 tab PO once daily  Dispense: 6 tablet; Refill: 0 - benzonatate (TESSALON) 200 MG capsule; Take 1 capsule (200 mg total) by mouth 2 (two) times daily as needed for cough.  Dispense: 20 capsule; Refill: 0 - cetirizine (ZYRTEC) 10 MG tablet; Take 1 tablet (10 mg total) by mouth daily.  Dispense: 30 tablet; Refill: 11  Given comorbidity and elevated blood glucose levels, reasonable to start azithromycin, Tessalon Perles, Zyrtec.  Patient education given on supportive care, red flags given for prompt reevaluation.  Follow Up Instructions: I discussed the assessment and treatment plan with the patient. The patient was provided an opportunity to ask questions and all were answered. The patient agreed with the plan and demonstrated an understanding of the instructions.  A copy of instructions were sent to the patient via MyChart unless otherwise noted below.     The patient was advised to call back or seek an in-person evaluation if the symptoms worsen or if the condition fails to improve as anticipated.  Time:  I spent 12 minutes with the patient via telehealth technology discussing the above problems/concerns.     Cheryl Grip Mayers, PA-C

## 2021-08-15 NOTE — Patient Instructions (Signed)
Berton Mount, thank you for joining Kennieth Rad, PA-C for today's virtual visit.  While this provider is not your primary care provider (PCP), if your PCP is located in our provider database this encounter information will be shared with them immediately following your visit.  Consent: (Patient) Cheryl [name] provided verbal consent for this virtual visit at the beginning of the encounter.  Current Medications:  Current Outpatient Medications:    azithromycin (ZITHROMAX) 250 MG tablet, Take 2 tabs PO day 1, then take 1 tab PO once daily, Disp: 6 tablet, Rfl: 0   benzonatate (TESSALON) 200 MG capsule, Take 1 capsule (200 mg total) by mouth 2 (two) times daily as needed for cough., Disp: 20 capsule, Rfl: 0   cetirizine (ZYRTEC) 10 MG tablet, Take 1 tablet (10 mg total) by mouth daily., Disp: 30 tablet, Rfl: 11   ARIPiprazole (ABILIFY) 5 MG tablet, Take 1 tablet (5 mg total) by mouth daily., Disp: 30 tablet, Rfl: 2   benztropine (COGENTIN) 0.5 MG tablet, Take 1 tablet (0.5 mg total) by mouth daily., Disp: 30 tablet, Rfl: 2   clonazePAM (KLONOPIN) 0.5 MG tablet, TAKE ONE (1) TABLET BY MOUTH EVERY DAY AS NEEDED FOR ANXIETY, Disp: 30 tablet, Rfl: 0   ibuprofen (ADVIL,MOTRIN) 200 MG tablet, Take 400 mg by mouth every 6 (six) hours as needed., Disp: , Rfl:    lamoTRIgine (LAMICTAL) 150 MG tablet, Take 1 tablet (150 mg total) by mouth 2 (two) times daily., Disp: 60 tablet, Rfl: 2   levothyroxine (SYNTHROID) 200 MCG tablet, Take 1 tablet (200 mcg total) by mouth daily., Disp: 90 tablet, Rfl: 3   metFORMIN (GLUCOPHAGE-XR) 500 MG 24 hr tablet, Take 2 tablets (1,000 mg total) by mouth daily in the afternoon., Disp: 180 tablet, Rfl: 3   naproxen sodium (ANAPROX) 220 MG tablet, Take 220 mg by mouth 2 (two) times daily as needed (pain)., Disp: , Rfl:    ONETOUCH DELICA LANCETS FINE MISC, As directed qd, Disp: 100 each, Rfl: 1   ONETOUCH VERIO test strip, 1 each by Other route daily  in the afternoon. Use to check blood sugar once a day. DX E11.51, E11.65, Disp: 100 each, Rfl: 3   rizatriptan (MAXALT-MLT) 10 MG disintegrating tablet, DISSOLVE ONE TABLET IN THE MOUTH DAILY AS NEEDED FOR MIGRAINE. MAY REPEAT IN 2 HOURS IF NEEDED., Disp: 10 tablet, Rfl: 0   rosuvastatin (CRESTOR) 10 MG tablet, Take 1 tablet (10 mg total) by mouth daily., Disp: 90 tablet, Rfl: 1   Semaglutide (RYBELSUS) 14 MG TABS, Take 14 mg by mouth daily., Disp: 30 tablet, Rfl: 6   sertraline (ZOLOFT) 100 MG tablet, Take 2 tablets (200 mg total) by mouth daily., Disp: 60 tablet, Rfl: 2   Medications ordered in this encounter:  Meds ordered this encounter  Medications   azithromycin (ZITHROMAX) 250 MG tablet    Sig: Take 2 tabs PO day 1, then take 1 tab PO once daily    Dispense:  6 tablet    Refill:  0    Order Specific Question:   Supervising Provider    Answer:   MILLER, BRIAN [[id number]]   benzonatate (TESSALON) 200 MG capsule    Sig: Take 1 capsule (200 mg total) by mouth 2 (two) times daily as needed for cough.    Dispense:  20 capsule    Refill:  0    Order Specific Question:   Supervising Provider    Answer:   Sabra Heck, BRIAN [[id number]]   cetirizine (ZYRTEC)  10 MG tablet    Sig: Take 1 tablet (10 mg total) by mouth daily.    Dispense:  30 tablet    Refill:  11    Order Specific Question:   Supervising Provider    Answer:   Noemi Chapel [[id number]]     *If you need refills on other medications prior to your next appointment, please contact your pharmacy*  Follow-Up: Call back or seek an in-person evaluation if the symptoms worsen or if the condition fails to improve as anticipated.  Other Instructions I do encourage you to make sure you are getting plenty of rest, drink lots of fluids   If you have been instructed to Upper Respiratory Infection, Adult An upper respiratory infection (URI) is a common viral infection of the nose, throat, and upper air passages that lead to the lungs. The most common  type of URI is the common cold. URIs usually get better on their own, without medical treatment. What are the causes? A URI is caused by a virus. You may catch a virus by: Breathing in droplets from an infected person's cough or sneeze. Touching something that has been exposed to the virus (is contaminated) and then touching your mouth, nose, or eyes. What increases the risk? You are more likely to get a URI if: You are very young or very old. You have close contact with others, such as at work, school, or a health care facility. You smoke. You have long-term (chronic) heart or lung disease. You have a weakened disease-fighting system (immune system). You have nasal allergies or asthma. You are experiencing a lot of stress. You have poor nutrition. What are the signs or symptoms? A URI usually involves some of the following symptoms: Runny or stuffy (congested) nose. Cough. Sneezing. Sore throat. Headache. Fatigue. Fever. Loss of appetite. Pain in your forehead, behind your eyes, and over your cheekbones (sinus pain). Muscle aches. Redness or irritation of the eyes. Pressure in the ears or face. How is this diagnosed? This condition may be diagnosed based on your medical history and symptoms, and a physical exam. Your health care provider may use a swab to take a mucus sample from your nose (nasal swab). This sample can be tested to determine what virus is causing the illness. How is this treated? URIs usually get better on their own within 7-10 days. Medicines cannot cure URIs, but your health care provider may recommend certain medicines to help relieve symptoms, such as: Over-the-counter cold medicines. Cough suppressants. Coughing is a type of defense against infection that helps to clear the respiratory system, so take these medicines only as recommended by your health care provider. Fever-reducing medicines. Follow these instructions at home: Activity Rest as needed. If  you have a fever, stay home from work or school until your fever is gone or until your health care provider says your URI cannot spread to other people (is no longer contagious). Your health care provider may have you wear a face mask to prevent your infection from spreading. Relieving symptoms Gargle with a mixture of salt and water 3-4 times a day or as needed. To make salt water, completely dissolve -1 tsp (3-6 g) of salt in 1 cup (237 mL) of warm water. Use a cool-mist humidifier to add moisture to the air. This can help you breathe more easily. Eating and drinking  Drink enough fluid to keep your urine pale yellow. Eat soups and other clear broths. General instructions  Take over-the-counter and prescription medicines  only as told by your health care provider. These include cold medicines, fever reducers, and cough suppressants. Do not use any products that contain nicotine or tobacco. These products include cigarettes, chewing tobacco, and vaping devices, such as e-cigarettes. If you need help quitting, ask your health care provider. Stay away from secondhand smoke. Stay up to date on all immunizations, including the yearly (annual) flu vaccine. Keep all follow-up visits. This is important. How to prevent the spread of infection to others URIs can be contagious. To prevent the infection from spreading: Wash your hands with soap and water for at least 20 seconds. If soap and water are not available, use hand sanitizer. Avoid touching your mouth, face, eyes, or nose. Cough or sneeze into a tissue or your sleeve or elbow instead of into your hand or into the air.  Contact a health care provider if: You are getting worse instead of better. You have a fever or chills. Your mucus is brown or red. You have yellow or brown discharge coming from your nose. You have pain in your face, especially when you bend forward. You have swollen neck glands. You have pain while swallowing. You have  white areas in the back of your throat. Get help right away if: You have shortness of breath that gets worse. You have severe or persistent: Headache. Ear pain. Sinus pain. Chest pain. You have chronic lung disease along with any of the following: Making high-pitched whistling sounds when you breathe, most often when you breathe out (wheezing). Prolonged cough (more than 14 days). Coughing up blood. A change in your usual mucus. You have a stiff neck. You have changes in your: Vision. Hearing. Thinking. Mood. These symptoms may be an emergency. Get help right away. Call 911. Do not wait to see if the symptoms will go away. Do not drive yourself to the hospital. Summary An upper respiratory infection (URI) is a common infection of the nose, throat, and upper air passages that lead to the lungs. A URI is caused by a virus. URIs usually get better on their own within 7-10 days. Medicines cannot cure URIs, but your health care provider may recommend certain medicines to help relieve symptoms. This information is not intended to replace advice given to you by your health care provider. Make sure you discuss any questions you have with your health care provider. Document Revised: 01/07/2021 Document Reviewed: 01/07/2021 Elsevier Patient Education  2022 [employer].  have an in-person evaluation today at a local Urgent Care facility, please use the link below. It will take you to a list of all of our available [facility], including address, phone number and hours of operation. Please do not delay care.  [facility]  If you or a family member do not have a primary care provider, use the link below to schedule a visit and establish care. When you choose a [facility] primary care physician or advanced practice provider, you gain a long-term partner in health. Find a Primary Care Provider  Learn more about [facility]'s in-office and virtual care options: [facility]

## 2021-08-27 ENCOUNTER — Ambulatory Visit: Payer: BC Managed Care – PPO | Admitting: Family Medicine

## 2021-09-01 ENCOUNTER — Ambulatory Visit: Payer: BC Managed Care – PPO | Admitting: Internal Medicine

## 2021-09-21 ENCOUNTER — Telehealth: Payer: Self-pay | Admitting: Family Medicine

## 2021-09-21 NOTE — Telephone Encounter (Signed)
Patient called asking if another epidural could be ordered for her?  ? ?Please advise. ? ?

## 2021-09-24 ENCOUNTER — Ambulatory Visit: Payer: BC Managed Care – PPO | Admitting: Family Medicine

## 2021-09-24 VITALS — BP 114/80 | HR 78 | Ht 66.0 in | Wt 216.4 lb

## 2021-09-24 DIAGNOSIS — M5416 Radiculopathy, lumbar region: Secondary | ICD-10-CM

## 2021-09-24 DIAGNOSIS — G8929 Other chronic pain: Secondary | ICD-10-CM | POA: Diagnosis not present

## 2021-09-24 DIAGNOSIS — M545 Low back pain, unspecified: Secondary | ICD-10-CM | POA: Diagnosis not present

## 2021-09-24 DIAGNOSIS — M4306 Spondylolysis, lumbar region: Secondary | ICD-10-CM

## 2021-09-24 MED ORDER — GABAPENTIN 300 MG PO CAPS: 300.0000 mg | ORAL_CAPSULE | Freq: Three times a day (TID) | ORAL | 3 refills | Status: DC | PRN

## 2021-09-24 NOTE — Patient Instructions (Addendum)
Thank you for coming in today.  ? ?Please call [facility] at [phone] to schedule your spine injection.   ? ?I've sent a prescription for Gabapentin to your pharmacy.  ? ?Let me know if you are not doing well before you leave for [place]. ?

## 2021-09-24 NOTE — Progress Notes (Signed)
? ?I, Peterson Lombard, LAT, ATC acting as a scribe for [name], MD. ? ?Cheryl [name] is a 46 y.o. female who presents to [place] at [facility] today for low back pain. Pt was previously seen by Dr. Tamala Julian on 04/10/20 for this issue and has had 2 nerve root blocks, most recently on 09/01/20. Pt's last lumbar ESI was performed on March 2022. Today, pt reports low back started worsening again about 2 weeks ago. Pt locates pain to the R-side of her low back. Pt has radiating/shooting pain in both legs, mostly along the anterior aspect. Pt notes the 1st spine injection was very effective, but the other 2 were not. Pt has an upcoming trip to [place] April 21. ? ?She is taking leftover gabapentin 200 mg which is not sufficient to control her pain especially at bedtime.  She is having significant difficulty sleeping. ? ?Radiating pain: yes ?LE numbness/tingling: yes- tingling ?LE weakness: no ?Aggravates: forward trunk flexion (like when getting dishes out of the dish washer) ?Treatments tried: prior PT, ESI, lumbar nerve root block ? ?Dx imaging: 07/20/20 Pelvis & L-spine MIR ? ?Pertinent review of systems: No fevers or chills ? ?Relevant historical information: Bipolar disorder currently well controlled.  Bilateral pars defects at L5. ? ? ?Exam:  ?BP 114/80   Pulse 78   Ht '5\' 6"'$  (1.676 m)   Wt 216 lb 6.4 oz (98.2 kg)   SpO2 95%   BMI 34.93 kg/m?  ?General: Well Developed, well nourished, and in no acute distress.  ? ?MSK: L-spine nontender midline.  Slight decreased range of motion.  Normal gait. ? ? ? ?Lab and Radiology Results ? ?EXAM: ?MRI LUMBAR SPINE WITHOUT CONTRAST ?  ?TECHNIQUE: ?Multiplanar, multisequence MR imaging of the lumbar spine was ?performed. No intravenous contrast was administered. ?  ?COMPARISON:  Lumbar radiography 02/29/2020 ?  ?FINDINGS: ?Segmentation:  5 lumbar type vertebrae ?  ?Alignment:  Grade 1 anterolisthesis at L5-S1. ?  ?Vertebrae: Chronic bilateral  pars defects at L5, best demonstrated ?by 2018 CT. No fracture, discitis, or aggressive bone lesion ?  ?Conus medullaris and cauda equina: Conus extends to the T12-L1 ?level. Conus and cauda equina appear normal. ?  ?Paraspinal and other soft tissues: No perispinal mass or ?inflammation. ?  ?Disc levels: ?  ?L5-S1 focal disc desiccation and narrowing with anterolisthesis and ?central to right foraminal protrusion. Right foraminal stenosis with ?L5 root flattening on sagittal images. ?  ?IMPRESSION: ?L5 chronic bilateral pars defects with L5-S1 anterolisthesis and ?focal disc degeneration. Degenerative changes including protrusion ?causes impingement of the right L5 foraminal nerve root. ?  ?  ?Electronically Signed ?  By: Monte Fantasia M.D. ?  On: 07/20/2020 11:34 ?  ?I, [name], personally (independently) visualized and performed the interpretation of the images attached in this note. ? ? ? ? ?Assessment and Plan: ?46 y.o. female with lumbar radiculopathy with pain radiating L5 dermatomal pattern worse on the right side.  Plan for repeat epidural steroid injection.  Last injection was a little over a year ago.  If this injection is not sufficient we may consider another injection at a different level or technique and if that is not sufficient would recommend repeat lumbar MRI. ?For temporary symptom control I have increased her gabapentin dose.  She currently is taking 200 mg and having trouble sleeping.  Will increase to 300+ milligrams especially at bedtime. ?Discussed precaution and potential next steps.  Advised to contact me prior to her trip to [place]  in April 21 if not doing well.  Certainly could prescribe other medications to take on this journey including prednisone or other pain medication. ? ? ?PDMP not reviewed this encounter. ?Orders Placed This Encounter  ?Procedures  ? DG INJECT DIAG/THERA/INC NEEDLE/CATH/PLC EPI/LUMB/SAC W/IMG  ?  Level and technique per radiology  ?  Standing Status:   Future   ?  Standing Expiration Date:   10/24/2021  ?  Order Specific Question:   Reason for Exam (SYMPTOM  OR DIAGNOSIS REQUIRED)  ?  Answer:   Low back pain  ?  Order Specific Question:   Preferred Imaging Location?  ?  Answer:   [id number]  ?  Order Specific Question:   Is the patient pregnant?  ?  Answer:   No  ? ?Meds ordered this encounter  ?Medications  ? gabapentin (NEURONTIN) 300 MG capsule  ?  Sig: Take 1-2 capsules (300-600 mg total) by mouth 3 (three) times daily as needed.  ?  Dispense:  90 capsule  ?  Refill:  3  ? ? ? ?Discussed warning signs or symptoms. Please see discharge instructions. Patient expresses understanding. ? ? ?The above documentation has been reviewed and is accurate and complete [name], M.D. ? ? ?

## 2021-09-30 ENCOUNTER — Ambulatory Visit
Admission: RE | Admit: 2021-09-30 | Discharge: 2021-09-30 | Disposition: A | Discharge status: Home / Self Care | Payer: BC Managed Care – PPO | Source: Ambulatory Visit | Attending: Family Medicine | Admitting: Family Medicine

## 2021-09-30 ENCOUNTER — Other Ambulatory Visit: Payer: Self-pay | Admitting: Family Medicine

## 2021-09-30 DIAGNOSIS — M545 Low back pain, unspecified: Secondary | ICD-10-CM

## 2021-09-30 IMAGING — XA Imaging study
3 series · 3 of 3 positions shown · non-contrast
Comparison: none

CLINICAL DATA: Low back pain.

[Series 1: ortho standard · 1 of 1 slices shown (1 of 3)]
[im 1/1]
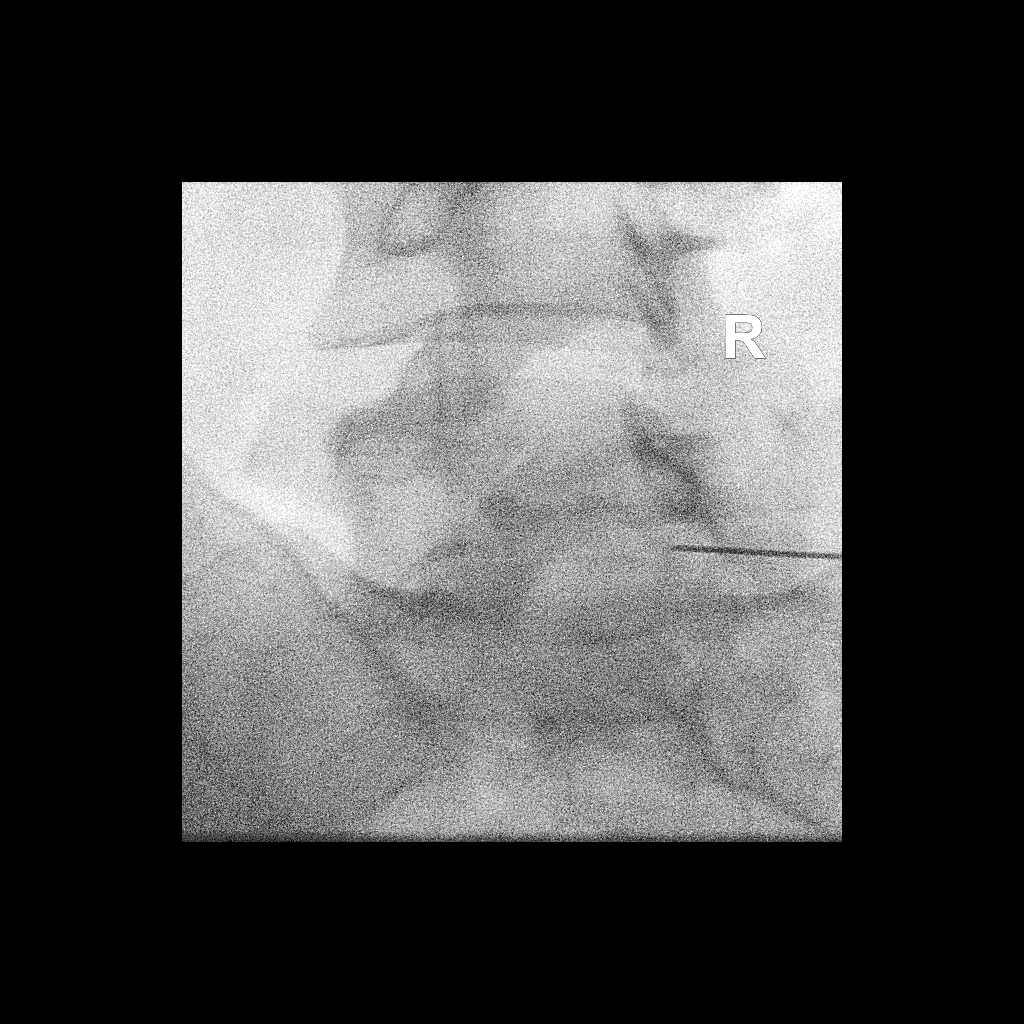

[Series 2: ortho standard · 1 of 1 slices shown (2 of 3)]
[im 1/1]
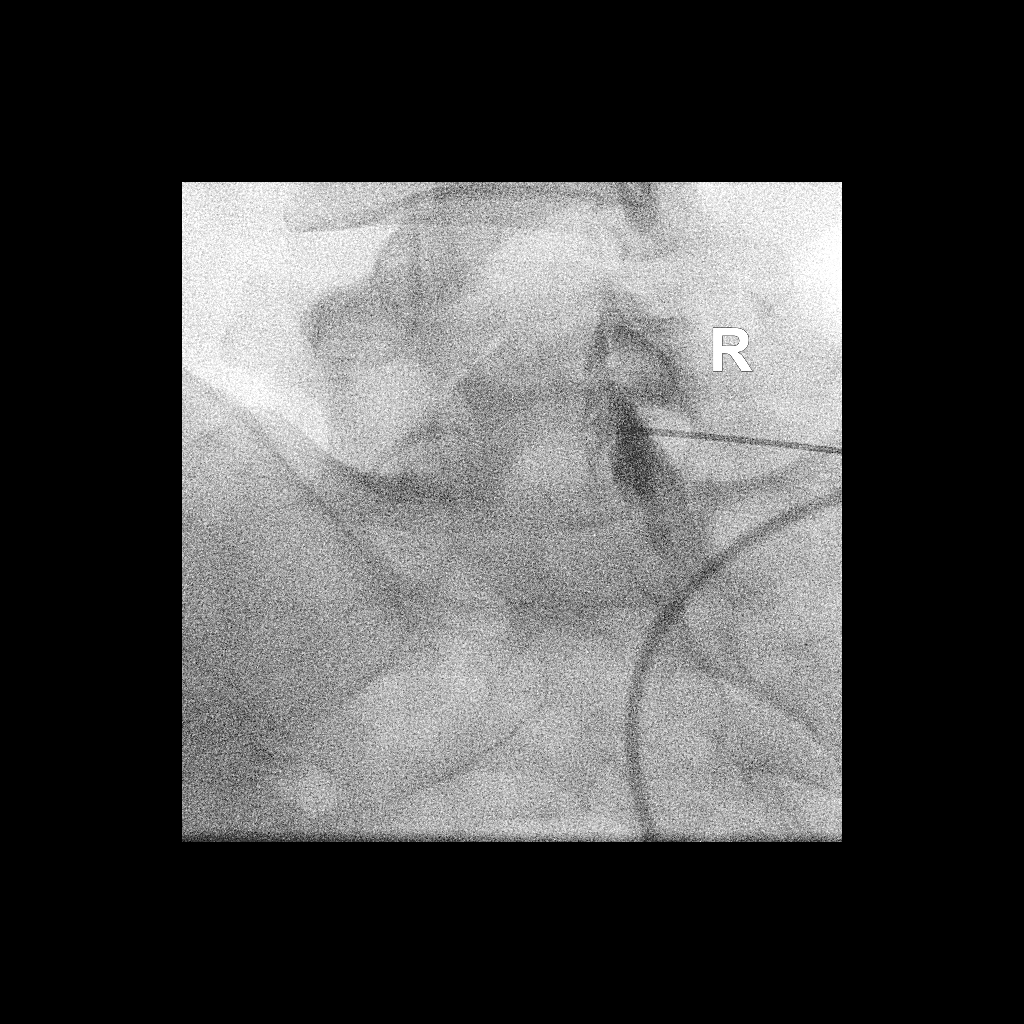

[Series 3: ortho standard · 1 of 1 slices shown (3 of 3)]
[im 1/1]
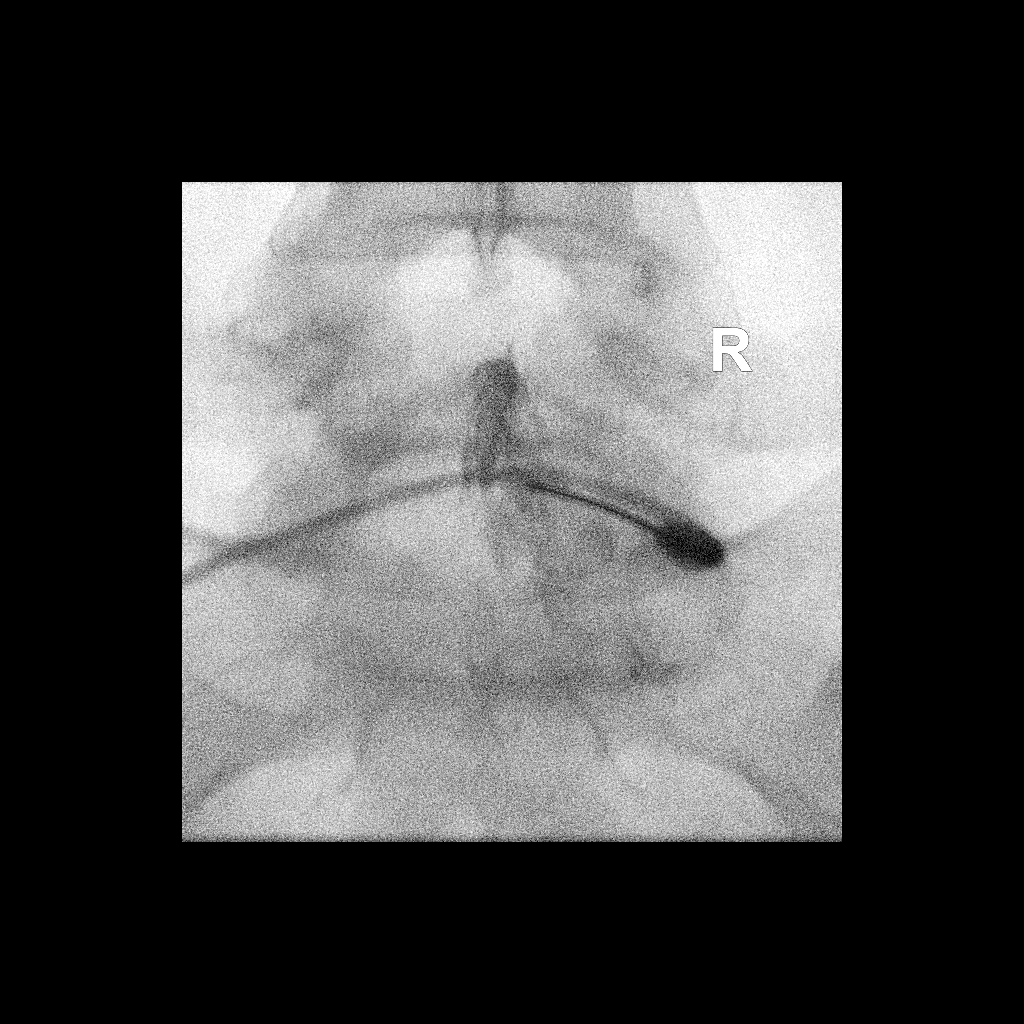

[3 of 3 positions shown; findings below may reference images not displayed]

Chronic right L5 radiculopathy with worsening left L5 radiculopathy.

Per patient right lower extremity pain is still greater than left.

FLUOROSCOPY:
Radiation Exposure Index (as provided by the fluoroscopic device):
4.5 mGy Kerma

PROCEDURE:
The procedure, risks, benefits, and alternatives were explained to
the patient. Questions regarding the procedure were encouraged and
answered. The patient understands and consents to the procedure.

An interlaminar approach was performed on right at L4-L5. The
overlying skin was cleansed and anesthetized. A 20 gauge epidural
needle was advanced using loss-of-resistance technique.

Injection of Isovue-M 200 shows a good epidural pattern with spread
above and below the level of needle placement, primarily on the
right no vascular opacification is seen.

80 mg of Depo-Medrol mixed with 2 mL of lidocaine were instilled.
The procedure was well-tolerated, and the patient was discharged
thirty minutes following the injection in good condition.

COMPLICATIONS:
None
IMPRESSION: Technically successful epidural injection on the right L4-L5.

## 2021-09-30 MED ORDER — IOPAMIDOL (ISOVUE-M 200) INJECTION 41%
1.0000 mL | Freq: Once | INTRAMUSCULAR | Status: AC
  Administered 2021-09-30: 1 mL via EPIDURAL

## 2021-09-30 MED ORDER — METHYLPREDNISOLONE ACETATE 40 MG/ML INJ SUSP (RADIOLOG
80.0000 mg | Freq: Once | INTRAMUSCULAR | Status: AC
  Administered 2021-09-30: 80 mg via EPIDURAL

## 2021-09-30 NOTE — Discharge Instructions (Signed)

## 2021-10-06 ENCOUNTER — Other Ambulatory Visit: Payer: Self-pay | Admitting: Family Medicine

## 2021-10-06 DIAGNOSIS — E1169 Type 2 diabetes mellitus with other specified complication: Secondary | ICD-10-CM

## 2021-10-06 NOTE — Telephone Encounter (Signed)
Requesting: Klonopin ?Contract: N/A ?UDS: N/A ?Last OV: 10/23/2020 ?Next OV: N/A ?Last Refill:  01/21/21, #30--0 RF ?Database: ? ? ?Please advise  ? ?

## 2021-10-07 ENCOUNTER — Encounter (HOSPITAL_COMMUNITY): Payer: Self-pay | Admitting: Psychiatry

## 2021-10-07 ENCOUNTER — Telehealth (HOSPITAL_BASED_OUTPATIENT_CLINIC_OR_DEPARTMENT_OTHER): Payer: BC Managed Care – PPO | Admitting: Psychiatry

## 2021-10-07 VITALS — Wt 216.0 lb

## 2021-10-07 DIAGNOSIS — F419 Anxiety disorder, unspecified: Secondary | ICD-10-CM | POA: Diagnosis not present

## 2021-10-07 DIAGNOSIS — F3181 Bipolar II disorder: Secondary | ICD-10-CM | POA: Diagnosis not present

## 2021-10-07 MED ORDER — SERTRALINE HCL 100 MG PO TABS: 200.0000 mg | ORAL_TABLET | Freq: Every day | ORAL | 2 refills | Status: DC

## 2021-10-07 MED ORDER — LAMOTRIGINE 150 MG PO TABS: 150.0000 mg | ORAL_TABLET | Freq: Two times a day (BID) | ORAL | 2 refills | Status: DC

## 2021-10-07 NOTE — Progress Notes (Signed)
Virtual Visit via Telephone Note ? ?I connected with Nirvi Boehler on 10/07/21 at  3:00 PM EDT by telephone and verified that I am speaking with the correct person using two identifiers. ? ?Location: ?Patient: Work ?Provider: Home Office ?  ?I discussed the limitations, risks, security and privacy concerns of performing an evaluation and management service by telephone and the availability of in person appointments. I also discussed with the patient that there may be a patient responsible charge related to this service. The patient expressed understanding and agreed to proceed. ? ? ?History of Present Illness: ?Patient is evaluated by phone session.  She reported a lot of stress from work as her job is getting more challenging.  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  Patient reported her immediate boss is leaving because of the stress.  She is not happy and now she is thinking to change her job.  She has been working with a company for past few years.  She is going to [place] with her sister and that for 1 week.  She excited about it but lately noticed more irritable and after a while she took yesterday Klonopin to calm her down.  She also having a lot of leg pain and recently seen the physician and prescribed steroids and now taking gabapentin more frequently to help the pain.  She stopped taking Abilify because she was concerned about her blood sugar and she had lost weight since she stopped Abilify.  She has no more tremors.  She denies any suicidal thoughts but admitted irritability, frustration mostly related to her job.  She has a plan to look seriously for another job when she come back from [place].  She had discussion on the last session about getting pregnant and she has not given more serious thoughts until she returned from [place].  She understand certain medication will be not safe in the pregnancy.  She is  compliant with Zoloft, Lamictal and reported no rash, itching shakes.  ? ?Past Psychiatric History:  ?H/O depression since college at [employer] state. Tried Prozac, wellbutrin and Effexor. Did not like Prozac and withdrawal from Effexor.  H/O mania, anger and mood swings.  No h/o inpatient treatment, suicidal attempt.  Saw Jobie Quaker at tried psychiatry but not happy with the services. ? ?Psychiatric Specialty Exam: ?Physical Exam  ?Review of Systems  ?Musculoskeletal:  Positive for back pain.  ?     Leg pain   ?Weight 216 lb (98 kg), last menstrual period 09/16/2021.There is no height or weight on file to calculate BMI.  ?General Appearance: NA  ?Eye Contact:  NA  ?Speech:  Normal Rate  ?Volume:  Normal  ?Mood:  Anxious and Irritable  ?Affect:  NA  ?Thought Process:  Goal Directed  ?Orientation:  Full (Time, Place, and Person)  ?Thought Content:  Rumination  ?Suicidal Thoughts:  No  ?Homicidal Thoughts:  No  ?Memory:  Immediate;   Good ?Recent;   Good ?Remote;   Good  ?Judgement:  Fair  ?Insight:  Present  ?Psychomotor Activity:  NA  ?Concentration:  Concentration: Fair and Attention Span: Fair  ?Recall:  Good  ?Fund of Knowledge:  Good  ?Language:  Good  ?Akathisia:  No  ?Handed:  Right  ?AIMS (if indicated):     ?Assets:  Communication Skills ?Desire for Improvement ?Housing ?Social Support ?Transportation  ?ADL's:  Intact  ?Cognition:  WNL  ?Sleep:  fair because of leg pain  ?  ? ? ?Assessment and Plan: ?Bipolar disorder type II.  Anxiety. ? ?I review current medication and psychosocial stressors.  Patient is no longer taking Abilify but she reported her stress is mostly due to her job.  She is going to look for a new job when she come back from [place] vacation.  She taken Klonopin after a while last night that helps her irritability and able to sleep better.  She also taking gabapentin.  I recommend she should take some Klonopin with her in case she needed for anxiety and nervousness.  Patient agreed with the  plan.  Her Klonopin is given by her PCP and she has few remaining.  We will discontinue Abilify as patient has lost weight and her blood sugar leave better than before.  She has upcoming appointment for physical and blood work.  Her last hemoglobin A1c was very high.  We will continue Lamictal 150 mg twice a day and Zoloft 200 mg daily.  She does not need Cogentin.  Recommended to call us back if she is any question or any concern.  Follow-up in 3 months.  At that time patient has more planning about her getting pregnant in the future.   ? ?Follow Up Instructions: ? ?  ?I discussed the assessment and treatment plan with the patient. The patient was provided an opportunity to ask questions and all were answered. The patient agreed with the plan and demonstrated an understanding of the instructions. ?  ?The patient was advised to call back or seek an in-person evaluation if the symptoms worsen or if the condition fails to improve as anticipated. ? ?Collaboration of Care: Primary Care Provider AEB notes are available in epic to review. ? ?Patient/Guardian was advised Release of Information must be obtained prior to any record release in order to collaborate their care with an outside provider. Patient/Guardian was advised if they have not already done so to contact the registration department to sign all necessary forms in order for [place] to release information regarding their care.  ? ?Consent: Patient/Guardian gives verbal consent for treatment and assignment of benefits for services provided during this visit. Patient/Guardian expressed understanding and agreed to proceed.   ? ?I provided 26 minutes of non-face-to-face time during this encounter. ? ? ?Kathlee Nations, MD  ?

## 2021-11-10 ENCOUNTER — Encounter: Payer: Self-pay | Admitting: Physician Assistant

## 2021-11-30 NOTE — Progress Notes (Deleted)
11/30/2021 Cheryl [name] [id number] [birth year]  Referring provider: Carollee Herter, Alferd Apa, * Primary GI doctor: Dr. Carlean Purl  ASSESSMENT AND PLAN:   There are no diagnoses linked to this encounter.   Patient Care Team: Carollee Herter, Alferd Apa, DO as PCP - General (Family Medicine) Adele Schilder, Arlyce Harman, MD as Consulting Physician (Psychiatry) Regina Eck, CNM as Referring Physician (Certified Nurse Midwife)  HISTORY OF PRESENT ILLNESS: 46 y.o. female with a past medical history of type 2 diabetes with peripheral artery vascular disease, OSA, hypothyroidism, hyperlipidemia, history of kidney stones bipolar 2 disorder and others listed below presents for evaluation of vomiting.  10/10/2008 colonoscopy rectal bleeding change in bowel habit Dr. Arelia Longest excellent prep with movie prep external hemorrhoids normal colonoscopy 04/29/2019 2 repeat thyroid 1.22 improved from 45 11/2020 blood glucose 185, normal kidney, liver  07/20/2020 MRI pelvis without contrast normal. 08/21/2016 punctate obstructing stone, small hiatal hernia Current Medications:   Current Outpatient Medications (Endocrine & Metabolic):    levothyroxine (SYNTHROID) 200 MCG tablet, Take 1 tablet (200 mcg total) by mouth daily.   metFORMIN (GLUCOPHAGE-XR) 500 MG 24 hr tablet, Take 2 tablets (1,000 mg total) by mouth daily in the afternoon.   Semaglutide (RYBELSUS) 14 MG TABS, Take 14 mg by mouth daily.  Current Outpatient Medications (Cardiovascular):    rosuvastatin (CRESTOR) 10 MG tablet, Take 1 tablet (10 mg total) by mouth daily. Pt needs office visit for more refills  Current Outpatient Medications (Respiratory):    cetirizine (ZYRTEC) 10 MG tablet, Take 1 tablet (10 mg total) by mouth daily.  Current Outpatient Medications (Analgesics):    ibuprofen (ADVIL,MOTRIN) 200 MG tablet, Take 400 mg by mouth every 6 (six) hours as needed.   naproxen sodium (ANAPROX) 220 MG tablet, Take 220 mg by mouth 2 (two) times  daily as needed (pain).   rizatriptan (MAXALT-MLT) 10 MG disintegrating tablet, DISSOLVE ONE TABLET IN THE MOUTH DAILY AS NEEDED FOR MIGRAINE. MAY REPEAT IN 2 HOURS IF NEEDED.   Current Outpatient Medications (Other):    ARIPiprazole (ABILIFY) 5 MG tablet, Take 1 tablet (5 mg total) by mouth daily. (Patient not taking: Reported on 10/07/2021)   azithromycin (ZITHROMAX) 250 MG tablet, Take 2 tabs PO day 1, then take 1 tab PO once daily (Patient not taking: Reported on 09/24/2021)   benztropine (COGENTIN) 0.5 MG tablet, Take 1 tablet (0.5 mg total) by mouth daily. (Patient not taking: Reported on 10/07/2021)   clonazePAM (KLONOPIN) 0.5 MG tablet, TAKE ONE (1) TABLET BY MOUTH EVERY DAY AS NEEDED FOR ANXIETY   gabapentin (NEURONTIN) 300 MG capsule, Take 1-2 capsules (300-600 mg total) by mouth 3 (three) times daily as needed.   lamoTRIgine (LAMICTAL) 150 MG tablet, Take 1 tablet (150 mg total) by mouth 2 (two) times daily.   ONETOUCH DELICA LANCETS FINE MISC, As directed qd   ONETOUCH VERIO test strip, 1 each by Other route daily in the afternoon. Use to check blood sugar once a day. DX E11.51, E11.65   sertraline (ZOLOFT) 100 MG tablet, Take 2 tablets (200 mg total) by mouth daily.  Medical History:  Past Medical History:  Diagnosis Date   Anxiety    Bipolar 1 disorder ([place])    Bipolar 2 disorder ([place])    Depression    Diabetes mellitus without complication ([place])    Kidney stone    Migraines    with aura   OSA (obstructive sleep apnea) 10/17/2013   Thyroid disease    Allergies:  Allergies  Allergen Reactions   Augmentin [Amoxicillin-Pot Clavulanate] Diarrhea   Food Itching    Bison-throat tingling     Surgical History:  She  has a past surgical history that includes Wisdom tooth extraction; Nasal septum surgery; Intrauterine device (iud) insertion (10/2011); and Other surgical history. Family History:  Her family history includes Alcohol abuse in her paternal grandfather; Aneurysm in  her paternal grandfather; COPD in her maternal grandmother; Cancer in her maternal aunt, maternal grandfather, maternal grandmother, and maternal uncle; Cancer (age of onset: 34) in her mother; Depression in her cousin, maternal grandmother, and sister; Diabetes in her father and paternal grandmother; Heart disease in her father; Hyperlipidemia in her father; Hypertension in her father and maternal grandmother; Hypothyroidism in her mother and sister; Migraines in her father; Stroke in her father, maternal grandmother, and paternal grandmother; Stroke (age of onset: 3) in her mother. Social History:   reports that she has been smoking cigarettes. She has a 20.00 pack-year smoking history. She has never used smokeless tobacco. She reports that she does not drink alcohol and does not use drugs.  REVIEW OF SYSTEMS  : All other systems reviewed and negative except where noted in the History of Present Illness.   PHYSICAL EXAM: There were no vitals taken for this visit. General:   Pleasant, well developed female in no acute distress Head:   Normocephalic and atraumatic. Eyes:  {sclerae:26738},conjunctive {conjuctiva:26739}  Heart:   {HEART EXAM HEM/ONC:21750} Pulm:  Clear anteriorly; no wheezing Abdomen:   {BlankSingle:19197::"Distended","Ridged","Soft"}, {BlankSingle:19197::"Flat","Obese","Non-distended"} AB, {BlankSingle:19197::"Absent","Hyperactive, tinkling","Hypoactive","Sluggish","Active"} bowel sounds. {actendernessAB:27319} tenderness {anatomy; site abdomen:5010}. {BlankMultiple:19196::"Without guarding","With guarding","Without rebound","With rebound"}, No organomegaly appreciated. Rectal: {acrectalexam:27461} Extremities:  {With/Without:304960234} edema. Msk: Symmetrical without gross deformities. Peripheral pulses intact.  Neurologic:  Alert and  oriented x4;  No focal deficits.  Skin:   Dry and intact without significant lesions or rashes. Psychiatric:  Cooperative. Normal mood and  affect.    Vladimir Crofts, PA-C 1:41 PM

## 2021-12-03 ENCOUNTER — Ambulatory Visit: Payer: BC Managed Care – PPO | Admitting: Physician Assistant

## 2021-12-28 ENCOUNTER — Encounter: Payer: Self-pay | Admitting: Physician Assistant

## 2021-12-28 ENCOUNTER — Other Ambulatory Visit: Payer: Self-pay

## 2021-12-28 ENCOUNTER — Ambulatory Visit: Payer: BC Managed Care – PPO | Admitting: Physician Assistant

## 2021-12-28 ENCOUNTER — Other Ambulatory Visit (INDEPENDENT_AMBULATORY_CARE_PROVIDER_SITE_OTHER): Payer: BC Managed Care – PPO

## 2021-12-28 VITALS — BP 114/72 | HR 88 | Ht 66.0 in | Wt 207.0 lb

## 2021-12-28 DIAGNOSIS — R7989 Other specified abnormal findings of blood chemistry: Secondary | ICD-10-CM

## 2021-12-28 DIAGNOSIS — R112 Nausea with vomiting, unspecified: Secondary | ICD-10-CM

## 2021-12-28 DIAGNOSIS — K219 Gastro-esophageal reflux disease without esophagitis: Secondary | ICD-10-CM | POA: Diagnosis not present

## 2021-12-28 DIAGNOSIS — Z1211 Encounter for screening for malignant neoplasm of colon: Secondary | ICD-10-CM | POA: Diagnosis not present

## 2021-12-28 LAB — COMPREHENSIVE METABOLIC PANEL
ALT: 50 U/L — ABNORMAL HIGH (ref 0–35)
AST: 30 U/L (ref 0–37)
Albumin: 4.8 g/dL (ref 3.5–5.2)
Alkaline Phosphatase: 68 U/L (ref 39–117)
BUN: 14 mg/dL (ref 6–23)
CO2: 26 mEq/L (ref 19–32)
Calcium: 10.1 mg/dL (ref 8.4–10.5)
Chloride: 99 mEq/L (ref 96–112)
Creatinine, Ser: 0.79 mg/dL (ref 0.40–1.20)
GFR: 89.93 mL/min (ref >60.00)
Glucose, Bld: 129 mg/dL — ABNORMAL HIGH (ref 70–99)
Potassium: 4.1 mEq/L (ref 3.5–5.1)
Sodium: 137 mEq/L (ref 135–145)
Total Bilirubin: 0.5 mg/dL (ref 0.2–1.2)
Total Protein: 8.1 g/dL (ref 6.0–8.3)

## 2021-12-28 LAB — CBC WITH DIFFERENTIAL/PLATELET
Basophils Absolute: 0.1 10*3/uL (ref 0.0–0.1)
Basophils Relative: 0.7 % (ref 0.0–3.0)
Eosinophils Absolute: 0.1 10*3/uL (ref 0.0–0.7)
Eosinophils Relative: 1.4 % (ref 0.0–5.0)
HCT: 42 % (ref 36.0–46.0)
Hemoglobin: 14.3 g/dL (ref 12.0–15.0)
Lymphocytes Relative: 30.4 % (ref 12.0–46.0)
Lymphs Abs: 2.8 10*3/uL (ref 0.7–4.0)
MCHC: 34 g/dL (ref 30.0–36.0)
MCV: 90.3 fl (ref 78.0–100.0)
Monocytes Absolute: 0.6 10*3/uL (ref 0.1–1.0)
Monocytes Relative: 6.4 % (ref 3.0–12.0)
Neutro Abs: 5.5 10*3/uL (ref 1.4–7.7)
Neutrophils Relative %: 61.1 % (ref 43.0–77.0)
Platelets: 244 10*3/uL (ref 150.0–400.0)
RBC: 4.65 Mil/uL (ref 3.87–5.11)
RDW: 13.3 % (ref 11.5–15.5)
WBC: 9.1 10*3/uL (ref 4.0–10.5)

## 2021-12-28 MED ORDER — ONDANSETRON HCL 4 MG PO TABS: 4.0000 mg | ORAL_TABLET | Freq: Three times a day (TID) | ORAL | 0 refills | Status: DC | PRN

## 2021-12-28 NOTE — Progress Notes (Signed)
12/28/2021 Cheryl [name] [id number] Apr 04, 1976  Referring provider: Ann Held, * Primary GI doctor: Dr. Carlean Purl  ASSESSMENT AND PLAN:   Nausea and vomiting, unspecified vomiting type with GERD daily Will start the patient on a PPI, will schedule for EGD Lifestyle changes discussed, avoid NSAIDS, ETOH Add on zofran, check sugars during episode Will check labs to evaluate LFTS and kidney, and CBC, last done in 2022 Could be from rebylsus, diet discussed May need RUQ [place] Continue weight loss -     CBC with Differential/Platelet; Future -     Comprehensive metabolic panel; Future -     ondansetron (ZOFRAN) 4 MG tablet; Take 1 tablet (4 mg total) by mouth every 8 (eight) hours as needed for nausea or vomiting. -     Tissue transglutaminase, IgA; Future -     IgA; Future  Screen for colon cancer We have discussed the risks of bleeding, infection, perforation, medication reactions, and remote risk of death associated with colonoscopy. All questions were answered and the patient acknowledges these risk and wishes to proceed.  Alternating diarrhea/constipation Add fiber, will schedule for colon/EGD Continue weight loss   Patient Care Team: Carollee Herter, Alferd Apa, DO as PCP - General (Family Medicine) Kathlee Nations, MD as Consulting Physician (Psychiatry) Regina Eck, CNM as Referring Physician (Certified Nurse Midwife)  HISTORY OF PRESENT ILLNESS: 46 y.o. female with a past medical history of type 2 diabetes with peripheral artery vascular disease, OSA, hypothyroidism, hyperlipidemia, history of kidney stones bipolar 2 disorder and others listed below presents for evaluation of vomiting.   10/10/2008 colonoscopy rectal bleeding change in bowel habit Dr. Carlean Purl excellent prep with moviprep external hemorrhoids normal colonoscopy 04/29/2019 2 repeat thyroid 1.22 improved from 45 11/2020 blood glucose 185, normal kidney, liver  07/20/2020 MRI pelvis without  contrast normal. 08/21/2016 punctate obstructing stone, small hiatal hernia  Maternal GF with colon cancer.  She alternates between constipation and diarrhea/flatulence with some formed stools between.  No blood in the stool.  She has had vomiting without reason for years but has been increasing in frequency. She has had 2-3 times in the past year.  She will being feeling fine and then have sudden nausea with vomiting.  Vomit is food. She has sweating, no discomfort anywhere.  She has reflux, avoid tomato based. Has GERD daily, feels constant "left sided gas bubble".  Denies melena, dysphagia.  She has had intentional weight loss.  No family history of GB issues.   She has been rybelsus for year. She take naproxen 1-2 x a month, will take 2 and then will take one at a time for joint pain.    Current Medications:   Current Outpatient Medications (Endocrine & Metabolic):    levothyroxine (SYNTHROID) 200 MCG tablet, Take 1 tablet (200 mcg total) by mouth daily.   metFORMIN (GLUCOPHAGE-XR) 500 MG 24 hr tablet, Take 2 tablets (1,000 mg total) by mouth daily in the afternoon.   Semaglutide (RYBELSUS) 14 MG TABS, Take 14 mg by mouth daily.  Current Outpatient Medications (Cardiovascular):    rosuvastatin (CRESTOR) 10 MG tablet, Take 1 tablet (10 mg total) by mouth daily. Pt needs office visit for more refills (Patient not taking: Reported on 12/28/2021)  Current Outpatient Medications (Respiratory):    cetirizine (ZYRTEC) 10 MG tablet, Take 1 tablet (10 mg total) by mouth daily.  Current Outpatient Medications (Analgesics):    ibuprofen (ADVIL,MOTRIN) 200 MG tablet, Take 400 mg by mouth every 6 (six)  hours as needed.   naproxen sodium (ANAPROX) 220 MG tablet, Take 220 mg by mouth 2 (two) times daily as needed (pain).   rizatriptan (MAXALT-MLT) 10 MG disintegrating tablet, DISSOLVE ONE TABLET IN THE MOUTH DAILY AS NEEDED FOR MIGRAINE. MAY REPEAT IN 2 HOURS IF NEEDED.   Current Outpatient  Medications (Other):    clonazePAM (KLONOPIN) 0.5 MG tablet, TAKE ONE (1) TABLET BY MOUTH EVERY DAY AS NEEDED FOR ANXIETY   gabapentin (NEURONTIN) 300 MG capsule, Take 1-2 capsules (300-600 mg total) by mouth 3 (three) times daily as needed.   lamoTRIgine (LAMICTAL) 150 MG tablet, Take 1 tablet (150 mg total) by mouth 2 (two) times daily.   ondansetron (ZOFRAN) 4 MG tablet, Take 1 tablet (4 mg total) by mouth every 8 (eight) hours as needed for nausea or vomiting.   ONETOUCH DELICA LANCETS FINE MISC, As directed qd   ONETOUCH VERIO test strip, 1 each by Other route daily in the afternoon. Use to check blood sugar once a day. DX E11.51, E11.65   sertraline (ZOLOFT) 100 MG tablet, Take 2 tablets (200 mg total) by mouth daily.   ARIPiprazole (ABILIFY) 5 MG tablet, Take 1 tablet (5 mg total) by mouth daily. (Patient not taking: Reported on 10/07/2021)   azithromycin (ZITHROMAX) 250 MG tablet, Take 2 tabs PO day 1, then take 1 tab PO once daily (Patient not taking: Reported on 09/24/2021)   benztropine (COGENTIN) 0.5 MG tablet, Take 1 tablet (0.5 mg total) by mouth daily. (Patient not taking: Reported on 10/07/2021)  Medical History:  Past Medical History:  Diagnosis Date   Anxiety    Bipolar 1 disorder ([place])    Bipolar 2 disorder ([place])    Depression    Diabetes mellitus without complication ([place])    Kidney stone    Migraines    with aura   OSA (obstructive sleep apnea) 10/17/2013   Thyroid disease    Allergies:  Allergies  Allergen Reactions   Augmentin [Amoxicillin-Pot Clavulanate] Diarrhea   Food Itching    Bison-throat tingling     Surgical History:  She  has a past surgical history that includes Wisdom tooth extraction; Nasal septum surgery; Intrauterine device (iud) insertion (10/2011); and Other surgical history. Family History:  Her family history includes Alcohol abuse in her paternal grandfather; Aneurysm in her paternal grandfather; COPD in her maternal grandmother; Cancer in  her maternal aunt, maternal grandfather, maternal grandmother, and maternal uncle; Cancer (age of onset: 84) in her mother; Depression in her cousin, maternal grandmother, and sister; Diabetes in her father and paternal grandmother; Heart disease in her father; Hyperlipidemia in her father; Hypertension in her father and maternal grandmother; Hypothyroidism in her mother and sister; Migraines in her father; Stroke in her father, maternal grandmother, and paternal grandmother; Stroke (age of onset: 70) in her mother. Social History:   reports that she has been smoking cigarettes. She has a 20.00 pack-year smoking history. She has never used smokeless tobacco. She reports that she does not drink alcohol and does not use drugs.  REVIEW OF SYSTEMS  : All other systems reviewed and negative except where noted in the History of Present Illness.   PHYSICAL EXAM: BP 114/72   Pulse 88   Ht '5\' 6"'$  (1.676 m)   Wt 207 lb (93.9 kg)   BMI 33.41 kg/m  General:   Pleasant, well developed female in no acute distress Head:   Normocephalic and atraumatic. Eyes:  sclerae anicteric,conjunctive pink  Heart:   regular rate  and rhythm Pulm:  Clear anteriorly; no wheezing Abdomen:   Soft, Obese AB, Active bowel sounds. No tenderness . , No organomegaly appreciated. Rectal: Not evaluated Extremities:  Without edema. Msk: Symmetrical without gross deformities. Peripheral pulses intact.  Neurologic:  Alert and  oriented x4;  No focal deficits.  Skin:   Dry and intact without significant lesions or rashes. Psychiatric:  Cooperative. Normal mood and affect.    Vladimir Crofts, PA-C 9:03 AM

## 2021-12-28 NOTE — Patient Instructions (Addendum)
Your provider has requested that you go to the basement level for lab work before leaving today. Press "B" on the elevator. The lab is located at the first door on the left as you exit the elevator.  Please take your proton pump inhibitor medication  Please take this medication 30 minutes to 1 hour before meals- this makes it more effective.  Avoid spicy and acidic foods Avoid fatty foods Limit your intake of coffee, tea, alcohol, and carbonated drinks Work to maintain a healthy weight Keep the head of the bed elevated at least 3 inches with blocks or a wedge pillow if you are having any nighttime symptoms Stay upright for 2 hours after eating Avoid meals and snacks three to four hours before bedtime  You have been scheduled for an endoscopy and colonoscopy. Please follow the written instructions given to you at your visit today. Please pick up your prep supplies at the pharmacy within the next 1-3 days. If you use inhalers (even only as needed), please bring them with you on the day of your procedure.  If you are age 91 or older, your body mass index should be between 23-30. Your Body mass index is 33.41 kg/m. If this is out of the aforementioned range listed, please consider follow up with your Primary Care Provider.  If you are age 63 or younger, your body mass index should be between 19-25. Your Body mass index is 33.41 kg/m. If this is out of the aformentioned range listed, please consider follow up with your Primary Care Provider.   ________________________________________________________  The [facility] providers would like to encourage you to use [facility] to communicate with providers for non-urgent requests or questions.  Due to long hold times on the telephone, sending your provider a message by [facility] may be a faster and more efficient way to get a response.  Please allow 48 business hours for a response.  Please remember that this is for non-urgent requests.   _______________________________________________________

## 2021-12-29 LAB — IGA: Immunoglobulin A: 315 mg/dL — ABNORMAL HIGH (ref 47–310)

## 2021-12-29 LAB — TISSUE TRANSGLUTAMINASE, IGA: (tTG) Ab, IgA: <1 U/mL

## 2022-01-01 ENCOUNTER — Ambulatory Visit (HOSPITAL_COMMUNITY): Payer: BC Managed Care – PPO

## 2022-01-04 ENCOUNTER — Telehealth: Payer: Self-pay | Admitting: Physician Assistant

## 2022-01-04 NOTE — Telephone Encounter (Signed)
Patient called today and wanted to inform you that she forgot to tell you at her appointment the other day that a few years ago she had a kidney stone and they found a slight hiatal hernia.  She was unsure if they could detect or look for that on the Ultrasound she has scheduled for tomorrow.  Please call patient and advise.  Thank you.

## 2022-01-04 NOTE — Telephone Encounter (Signed)
Spoke to patient & made her aware that I will pass on this information to [place], [place], and if there are any changes made I will let her know.

## 2022-01-05 ENCOUNTER — Ambulatory Visit (HOSPITAL_COMMUNITY)
Admission: RE | Admit: 2022-01-05 | Discharge: 2022-01-05 | Disposition: A | Discharge status: Home / Self Care | Payer: BC Managed Care – PPO | Source: Ambulatory Visit | Attending: Physician Assistant | Admitting: Physician Assistant

## 2022-01-05 DIAGNOSIS — R7989 Other specified abnormal findings of blood chemistry: Secondary | ICD-10-CM | POA: Diagnosis present

## 2022-01-05 DIAGNOSIS — R112 Nausea with vomiting, unspecified: Secondary | ICD-10-CM | POA: Insufficient documentation

## 2022-01-05 MED ORDER — PANTOPRAZOLE SODIUM 40 MG PO TBEC: 40.0000 mg | DELAYED_RELEASE_TABLET | Freq: Two times a day (BID) | ORAL | 0 refills | Status: DC

## 2022-01-05 NOTE — Addendum Note (Signed)
Addended by: Vicie Mutters on: 01/05/2022 11:40 AM   Modules accepted: Orders

## 2022-01-05 NOTE — Telephone Encounter (Signed)
Patient called stating that she had her abd ultrasound done and everything came back normal. Patient stated that she has lost 4 pounds in the last week and has been vomiting and has been scared to eat. Patient is requesting a call back to discuss. Please advise.

## 2022-01-05 NOTE — Telephone Encounter (Signed)
Left message for patient to call back  

## 2022-01-05 NOTE — Telephone Encounter (Signed)
Spoke with patient regarding PA recommendations. She is going to try the pantoprazole & zofran first before cutting back on rybelsus. She has been reminded of ED precautions & to call back with any questions.

## 2022-01-05 NOTE — Telephone Encounter (Signed)
Sent in pantoprazole 40 mg twice daily to take daily, before food to her pharmacy. Make sure she has zofran, can refill if need to.  Please try taking the zofran before eating.  Can do trial off rybelsus or cutting back on dose, make sure not taking NSAIDS.  Given gastroparesis information.   Go to the ER if you have any yellowing of the skin/eyes, dark urine, severe AB pain, fever, chills, nausea or vomiting.  Can consider HIDA if EGD negative.

## 2022-01-05 NOTE — Telephone Encounter (Signed)
Patient called in within complaints of abdominal cramping with meals, nausea, and a 4 lb weight loss this week. She had one episode of vomiting on Sunday, and since then she has been hesitant to eat. She is able to hold down bland foods, and has been tolerating that okay. She takes zofran prn which provides some relief. Patient is asking for further recommendations, given that her procedure is still a month out. She was last seen for OV with Estill Bamberg, [place] on 12/28/21.

## 2022-01-06 ENCOUNTER — Encounter (HOSPITAL_COMMUNITY): Payer: Self-pay | Admitting: Psychiatry

## 2022-01-06 ENCOUNTER — Telehealth (HOSPITAL_BASED_OUTPATIENT_CLINIC_OR_DEPARTMENT_OTHER): Payer: BC Managed Care – PPO | Admitting: Psychiatry

## 2022-01-06 VITALS — Wt 203.0 lb

## 2022-01-06 DIAGNOSIS — F419 Anxiety disorder, unspecified: Secondary | ICD-10-CM | POA: Diagnosis not present

## 2022-01-06 DIAGNOSIS — F3181 Bipolar II disorder: Secondary | ICD-10-CM

## 2022-01-06 MED ORDER — LAMOTRIGINE 150 MG PO TABS: 150.0000 mg | ORAL_TABLET | Freq: Two times a day (BID) | ORAL | 2 refills | Status: DC

## 2022-01-06 MED ORDER — SERTRALINE HCL 100 MG PO TABS
200.0000 mg | ORAL_TABLET | Freq: Every day | ORAL | 2 refills | Status: DC
Start: 2022-01-06 — End: 2022-04-06

## 2022-01-06 MED ORDER — CLONAZEPAM 0.5 MG PO TABS
ORAL_TABLET | ORAL | 1 refills | Status: DC
Start: 2022-01-06 — End: 2022-05-02

## 2022-01-06 NOTE — Progress Notes (Signed)
Virtual Visit via Telephone Note  I connected with Cheryl [name] on 01/06/22 at  3:20 PM EDT by telephone and verified that I am speaking with the correct person using two identifiers.  Location: Patient: home Provider: home office   I discussed the limitations, risks, security and privacy concerns of performing an evaluation and management service by telephone and the availability of in person appointments. I also discussed with the patient that there may be a patient responsible charge related to this service. The patient expressed understanding and agreed to proceed.   History of Present Illness: Patient is evaluated by phone session.  She reported a lot of stress and anxiety which she related due to work.  She is actively looking for a new job but so far she has not had a lock.  Patient also told that trip to [place] was very stressful.  First few days were good but then her dad started to have a lot of issues.  Patient visited to [place] with her sister and dad because that was her dad's bucket list.  She also concerned about her general health.  She is having a lot of nausea, throwing up and she had lost a lot of weight.  She is not taking Abilify for more than 4 months.  She sleeps fair.  Recently she had blood work.  Now she is scheduled to see GI for endoscopy and colonoscopy.  She is nervous about her health condition.  She wants to quit the job because it is some time very stressful but she has no other choice just to keep because of the insurance.  Last night she spent in the ER because her husband was sick.  Patient endorsed sometimes feeling overwhelmed but denies any suicidal thoughts or homicidal thoughts.  She is prescribed Klonopin which she takes as needed but sometimes it makes her sleepy and she cannot take during the day.  Her energy level is fair.  She is compliant with Zoloft and Lamictal.  She has no rash or any itching.  She denies any mania, psychosis, hallucination or any  impulsive behavior.    Past Psychiatric History:  H/O depression since college at [employer] state. Tried Prozac, wellbutrin and Effexor. Did not like Prozac and withdrawal from Effexor.  H/O mania, anger and mood swings.  Abilify worked but stopped due to weight gain. No h/o inpatient treatment, suicidal attempt.  Saw Jobie Quaker at tried psychiatry but not happy with the services.  Recent Results (from the past 2160 hour(s))  IgA     Status: Abnormal   Collection Time: 12/28/21  9:22 AM  Result Value Ref Range   Immunoglobulin A 315 (H) 47 - 310 mg/dL  Tissue transglutaminase, IgA     Status: None   Collection Time: 12/28/21  9:22 AM  Result Value Ref Range   (tTG) Ab, IgA <1.0 U/mL    Comment: Value          Interpretation -----          -------------- <15.0          Antibody not detected > or = 15.0    Antibody detected .   Comprehensive metabolic panel     Status: Abnormal   Collection Time: 12/28/21  9:22 AM  Result Value Ref Range   Sodium 137 135 - 145 mEq/L   Potassium 4.1 3.5 - 5.1 mEq/L   Chloride 99 96 - 112 mEq/L   CO2 26 19 - 32 mEq/L   Glucose,  Bld 129 (H) 70 - 99 mg/dL   BUN 14 6 - 23 mg/dL   Creatinine, Ser 0.79 0.40 - 1.20 mg/dL   Total Bilirubin 0.5 0.2 - 1.2 mg/dL   Alkaline Phosphatase 68 39 - 117 U/L   AST 30 0 - 37 U/L   ALT 50 (H) 0 - 35 U/L   Total Protein 8.1 6.0 - 8.3 g/dL   Albumin 4.8 3.5 - 5.2 g/dL   GFR 89.93 >60.00 mL/min    Comment: Calculated using the CKD-EPI Creatinine Equation (2021)   Calcium 10.1 8.4 - 10.5 mg/dL  CBC with Differential/Platelet     Status: None   Collection Time: 12/28/21  9:22 AM  Result Value Ref Range   WBC 9.1 4.0 - 10.5 K/uL   RBC 4.65 3.87 - 5.11 Mil/uL   Hemoglobin 14.3 12.0 - 15.0 g/dL   HCT 42.0 36.0 - 46.0 %   MCV 90.3 78.0 - 100.0 fl   MCHC 34.0 30.0 - 36.0 g/dL   RDW 13.3 11.5 - 15.5 %   Platelets 244.0 150.0 - 400.0 K/uL   Neutrophils Relative % 61.1 43.0 - 77.0 %   Lymphocytes Relative 30.4 12.0 -  46.0 %   Monocytes Relative 6.4 3.0 - 12.0 %   Eosinophils Relative 1.4 0.0 - 5.0 %   Basophils Relative 0.7 0.0 - 3.0 %   Neutro Abs 5.5 1.4 - 7.7 K/uL   Lymphs Abs 2.8 0.7 - 4.0 K/uL   Monocytes Absolute 0.6 0.1 - 1.0 K/uL   Eosinophils Absolute 0.1 0.0 - 0.7 K/uL   Basophils Absolute 0.1 0.0 - 0.1 K/uL     Psychiatric Specialty Exam: Physical Exam  Review of Systems  Gastrointestinal:  Positive for nausea.  Musculoskeletal:  Positive for neck pain.    Weight 203 lb (92.1 kg).There is no height or weight on file to calculate BMI.  General Appearance: NA  Eye Contact:  NA  Speech:  Slow  Volume:  Decreased  Mood:  Anxious and Dysphoric  Affect:  Constricted and Depressed  Thought Process:  Goal Directed  Orientation:  Full (Time, Place, and Person)  Thought Content:  Rumination  Suicidal Thoughts:  No  Homicidal Thoughts:  No  Memory:  Immediate;   Good Recent;   Good Remote;   Good  Judgement:  Intact  Insight:  Present  Psychomotor Activity:  NA  Concentration:  Concentration: Fair and Attention Span: Fair  Recall:  Good  Fund of Knowledge:  Good  Language:  Good  Akathisia:  No  Handed:  Right  AIMS (if indicated):     Assets:  Communication Skills  ADL's:  Intact  Cognition:  WNL  Sleep:   fair      Assessment and Plan: Bipolar disorder type II.  Anxiety.  Review blood results.  Mild elevation of 1 of liver enzymes.  Patient is scheduled to see GI for colonoscopy and endoscopy.  She had lost weight.  I encouraged to keep appointment for GI workup.  I also encouraged should take the Klonopin half tablet during the day if needed and take full tablet at bedtime for insomnia.  Patient does not want to change or add any medication as like to get workup first to find the cause of nausea and weight loss.  However if all labs are normal then we will consider restarting low-dose Abilify which had helped her but patient was gaining weight.  Other choices of REXULTI or  Latuda.  Patient is  also looking for a new job.  She is hoping that her husband cousins may help to find a job for her.  Continue Zoloft 200 mg daily, Klonopin 0.5 mg daily as needed for anxiety and Lamictal 150 mg twice a day.  Discussed safety concerns and anytime having active suicidal thoughts or homicidal halogen need to call 911 or go to local emergency room.  Follow-up in 3 months but patient will call us earlier if needed medication adjustment or worsening of symptoms.   Follow Up Instructions:    I discussed the assessment and treatment plan with the patient. The patient was provided an opportunity to ask questions and all were answered. The patient agreed with the plan and demonstrated an understanding of the instructions.   The patient was advised to call back or seek an in-person evaluation if the symptoms worsen or if the condition fails to improve as anticipated. Collaboration of Care: Primary Care Provider AEB notes are available in epic to review.   Patient/Guardian was advised Release of Information must be obtained prior to any record release in order to collaborate their care with an outside provider. Patient/Guardian was advised if they have not already done so to contact the registration department to sign all necessary forms in order for [place] to release information regarding their care.   Consent: Patient/Guardian gives verbal consent for treatment and assignment of benefits for services provided during this visit. Patient/Guardian expressed understanding and agreed to proceed.    I provided 30 minutes of non-face-to-face time during this encounter.   Kathlee Nations, MD

## 2022-01-07 ENCOUNTER — Other Ambulatory Visit (HOSPITAL_COMMUNITY): Payer: Self-pay

## 2022-01-08 ENCOUNTER — Telehealth (HOSPITAL_COMMUNITY): Payer: Self-pay | Admitting: Psychiatry

## 2022-01-08 NOTE — Telephone Encounter (Signed)
Left VM requesting call back to schedule 3 month follow up appointment with provider.

## 2022-01-29 ENCOUNTER — Encounter: Payer: Self-pay | Admitting: Internal Medicine

## 2022-01-30 ENCOUNTER — Encounter: Payer: Self-pay | Admitting: Certified Registered Nurse Anesthetist

## 2022-02-03 ENCOUNTER — Telehealth: Payer: Self-pay | Admitting: Internal Medicine

## 2022-02-03 NOTE — Telephone Encounter (Signed)
Good Afternoon Dr. Carlean Purl,  Patient called stating that she needed to cancel her appointment for her endoscopy and colonoscopy   on 8/18 due to money situations.  Patient stated she will call back at a later time to reschedule.

## 2022-02-03 NOTE — Telephone Encounter (Signed)
OK no charge ?

## 2022-02-05 ENCOUNTER — Encounter: Payer: BC Managed Care – PPO | Admitting: Internal Medicine

## 2022-03-23 ENCOUNTER — Other Ambulatory Visit (HOSPITAL_COMMUNITY): Payer: Self-pay | Admitting: Psychiatry

## 2022-03-23 DIAGNOSIS — F419 Anxiety disorder, unspecified: Secondary | ICD-10-CM

## 2022-03-23 DIAGNOSIS — F3181 Bipolar II disorder: Secondary | ICD-10-CM

## 2022-04-06 ENCOUNTER — Telehealth: Payer: Self-pay | Admitting: Family Medicine

## 2022-04-06 ENCOUNTER — Telehealth (HOSPITAL_BASED_OUTPATIENT_CLINIC_OR_DEPARTMENT_OTHER): Payer: BC Managed Care – PPO | Admitting: Psychiatry

## 2022-04-06 ENCOUNTER — Encounter (HOSPITAL_COMMUNITY): Payer: Self-pay | Admitting: Psychiatry

## 2022-04-06 ENCOUNTER — Encounter: Payer: Self-pay | Admitting: Family Medicine

## 2022-04-06 DIAGNOSIS — F419 Anxiety disorder, unspecified: Secondary | ICD-10-CM | POA: Diagnosis not present

## 2022-04-06 DIAGNOSIS — F3181 Bipolar II disorder: Secondary | ICD-10-CM | POA: Diagnosis not present

## 2022-04-06 DIAGNOSIS — M5416 Radiculopathy, lumbar region: Secondary | ICD-10-CM

## 2022-04-06 DIAGNOSIS — M4306 Spondylolysis, lumbar region: Secondary | ICD-10-CM

## 2022-04-06 MED ORDER — SERTRALINE HCL 100 MG PO TABS: 200.0000 mg | ORAL_TABLET | Freq: Every day | ORAL | 2 refills | Status: AC

## 2022-04-06 MED ORDER — LAMOTRIGINE 150 MG PO TABS: 150.0000 mg | ORAL_TABLET | Freq: Two times a day (BID) | ORAL | 2 refills | Status: AC

## 2022-04-06 NOTE — Telephone Encounter (Signed)
Patient states her pharmacy told her that we needed to get her A1C checked. No orders found, please advise.

## 2022-04-06 NOTE — Progress Notes (Signed)
Virtual Visit via Telephone Note  I connected with Cheryl [name] on 04/06/22 at  3:00 PM EDT by telephone and verified that I am speaking with the correct person using two identifiers.  Location: Patient: Work Provider: Biomedical scientist   I discussed the limitations, risks, security and privacy concerns of performing an evaluation and management service by telephone and the availability of in person appointments. I also discussed with the patient that there may be a patient responsible charge related to this service. The patient expressed understanding and agreed to proceed.   History of Present Illness: Patient is evaluated by phone session.  She is now actively looking for a teaching job.  She has interest to teach social studies and history.  She is not happy with her current job.  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  She reported job is very stressful.  She is also doing occupancy for her back pain and she noticed that helps her mood symptoms.  She denies any irritability, mania, anger.  She decided not to do endoscopy and colonoscopy and now watching her diet and cut down the spicy food.  She noticed things are much better.  She lives with her husband.  She denies any panic attack and has not taken the Klonopin since the last visit.  She has no tremors, shakes or any EPS.  She wants to continue Lamictal and Zoloft.  She used to take Abilify however it stopped more than 6 months ago.  She denies any suicidal thoughts or homicidal thoughts.  She has no rash, itching tremors or shakes.  Past Psychiatric History:  H/O depression since college at [employer] state. Tried Prozac, wellbutrin and Effexor. Did not like Prozac and withdrawal from Effexor.  H/O mania, anger and mood swings.  Abilify worked but stopped due to weight gain. No h/o inpatient treatment, suicidal attempt.  Saw Jobie Quaker at tried psychiatry but not happy with the services.   Psychiatric Specialty  Exam: Physical Exam  Review of Systems  Weight 203 lb (92.1 kg).There is no height or weight on file to calculate BMI.  General Appearance: NA  Eye Contact:  NA  Speech:   fast but clear  Volume:  Normal  Mood:  Anxious  Affect:  NA  Thought Process:  Descriptions of Associations: Intact  Orientation:  Full (Time, Place, and Person)  Thought Content:  Rumination  Suicidal Thoughts:  No  Homicidal Thoughts:  No  Memory:  Immediate;   Good Recent;   Good Remote;   Fair  Judgement:  Intact  Insight:  Present  Psychomotor Activity:  NA  Concentration:  Concentration: Fair and Attention Span: Fair  Recall:  Good  Fund of Knowledge:  Good  Language:  Good  Akathisia:  No  Handed:  Right  AIMS (if indicated):     Assets:  Communication Skills Desire for Improvement Housing Resilience Transportation  ADL's:  Intact  Cognition:  WNL  Sleep:         Assessment and Plan: Bipolar disorder type II.  Anxiety.  Patient decided not to do the colonoscopy and endoscopy and working on her diet.  She feels her anxiety and mood symptoms are stable but decided to change the job and looking for a teaching job.  She does not want to change the medication because she feels her mood is okay.  She denies her job situation may cause stress and if she changed the job she will be more  relaxed.  I will continue Zoloft 200 mg daily, Klonopin 0.5 mg to take as needed however does not need a new refill at this time.  Continue Lamictal 150 mg 2 times a day.  Recommend to call us back if she has any question or any concern.  Follow-up in 3 months.  Follow Up Instructions:    I discussed the assessment and treatment plan with the patient. The patient was provided an opportunity to ask questions and all were answered. The patient agreed with the plan and demonstrated an understanding of the instructions.   The patient was advised to call back or seek an in-person evaluation if the symptoms worsen or if  the condition fails to improve as anticipated.  I provided 18 minutes of non-face-to-face time during this encounter.   Kathlee Nations, MD

## 2022-04-07 ENCOUNTER — Ambulatory Visit: Payer: BC Managed Care – PPO | Admitting: Family Medicine

## 2022-04-07 ENCOUNTER — Ambulatory Visit: Payer: Self-pay

## 2022-04-07 VITALS — BP 132/82 | HR 92 | Ht 66.0 in | Wt 209.0 lb

## 2022-04-07 DIAGNOSIS — G8929 Other chronic pain: Secondary | ICD-10-CM

## 2022-04-07 DIAGNOSIS — M545 Low back pain, unspecified: Secondary | ICD-10-CM

## 2022-04-07 DIAGNOSIS — M5416 Radiculopathy, lumbar region: Secondary | ICD-10-CM | POA: Diagnosis not present

## 2022-04-07 NOTE — Telephone Encounter (Signed)
A1c not checked since 11/09/20. Please advise

## 2022-04-07 NOTE — Patient Instructions (Addendum)
Thank you for coming in today.   You received an injection today. Seek immediate medical attention if the joint becomes red, extremely painful, or is oozing fluid.   Let me know about the MRI. That would be helpful for planning out injections to help the leg pain. Anticipate Rt L3 and L5 nerve roots as a problem.

## 2022-04-07 NOTE — Telephone Encounter (Signed)
Pt called. LDVM of below recommendation

## 2022-04-07 NOTE — Progress Notes (Signed)
I, Cheryl [name], LAT, ATC acting as a scribe for Cheryl Leader, MD.  Cheryl [name] is a 46 y.o. female who presents to [place] at [facility] today for continued chronic low back pain.  Patient was last seen by Dr. Georgina Snell on 09/24/2021 and a repeat ESI was ordered, and later performed on 09/30/2021.  Patient was also prescribed a higher dose of gabapentin, 300 mg. Today, pt reports the most relief from prior ESI was the 1st one performed on 10/30/19 at L5-S1. Pt now locates pain to the R SI joint region.   Radiating pain: yes- mostly the anterior aspect of R leg, but sometimes posteriorly to R toes LE numbness/tingling: no LE weakness: no Treatments tried: acupuncture, chiro  Dx imaging: 07/20/20 Pelvis & L-spine MRI  02/29/20 L-spine XR  Pertinent review of systems: No fevers or chills  Relevant historical information: Diabetes Pars defects lumbar spine L5 bilaterally  Exam:  BP 132/82   Pulse 92   Ht '5\' 6"'$  (1.676 m)   Wt 209 lb (94.8 kg)   SpO2 95%   BMI 33.73 kg/m  General: Well Developed, well nourished, and in no acute distress.   MSK: L-spine tender palpation right SI joint.  Nontender midline.  Decreased lumbar motion.  Lower extremity strength intact.  Reflexes are intact.  Sensation is intact throughout.    Lab and Radiology Results  Procedure: Real-time Ultrasound Guided Injection of right SI joint Device: Philips Affiniti 50G Images permanently stored and available for review in PACS Verbal informed consent obtained.  Discussed risks and benefits of procedure. Warned about infection, bleeding, hyperglycemia damage to structures among others. Patient expresses understanding and agreement Time-out conducted.   Noted no overlying erythema, induration, or other signs of local infection.   Skin prepped in a sterile fashion.   Local anesthesia: Topical Ethyl chloride.   With sterile technique and under real time ultrasound guidance: 40 mg of Kenalog  and 2 mL of Marcaine injected into right SI joint. Fluid seen entering the joint capsule.   Completed without difficulty   Pain immediately resolved suggesting accurate placement of the medication.   Advised to call if fevers/chills, erythema, induration, drainage, or persistent bleeding.   Images permanently stored and available for review in the ultrasound unit.  Impression: Technically successful ultrasound guided injection.    EXAM: MRI LUMBAR SPINE WITHOUT CONTRAST   TECHNIQUE: Multiplanar, multisequence MR imaging of the lumbar spine was performed. No intravenous contrast was administered.   COMPARISON:  Lumbar radiography 02/29/2020   FINDINGS: Segmentation:  5 lumbar type vertebrae   Alignment:  Grade 1 anterolisthesis at L5-S1.   Vertebrae: Chronic bilateral pars defects at L5, best demonstrated by 2018 CT. No fracture, discitis, or aggressive bone lesion   Conus medullaris and cauda equina: Conus extends to the T12-L1 level. Conus and cauda equina appear normal.   Paraspinal and other soft tissues: No perispinal mass or inflammation.   Disc levels:   L5-S1 focal disc desiccation and narrowing with anterolisthesis and central to right foraminal protrusion. Right foraminal stenosis with L5 root flattening on sagittal images.   IMPRESSION: L5 chronic bilateral pars defects with L5-S1 anterolisthesis and focal disc degeneration. Degenerative changes including protrusion causes impingement of the right L5 foraminal nerve root.     Electronically Signed   By: Monte Fantasia M.D.   On: 07/20/2020 11:34 I, Cheryl [name], personally (independently) visualized and performed the interpretation of the images attached in this note.  Assessment and Plan: 46 y.o. female with right low back pain thought to be SI joint pain related.  Plan for injection.  Additionally she has right leg pain in an L3 and L5 dermatomal pattern without significant weakness.  However the  radicular pain has been ongoing now for a few years despite conservative management trial of physical therapy and 3 epidural steroid injections.  Plan for SI joint injection for the low back pain.  Consider MRI lumbar spine to evaluate the worsening chronic radicular pain with new symptoms.  The prior MRI in 2022 does explain the L5 dermatomal pattern of pain but not the L3 dermatomal pattern.   PDMP not reviewed this encounter. Orders Placed This Encounter  Procedures   [place] LIMITED JOINT SPACE STRUCTURES LOW RIGHT(NO LINKED CHARGES)    Order Specific Question:   Reason for Exam (SYMPTOM  OR DIAGNOSIS REQUIRED)    Answer:   right SI joint pain    Order Specific Question:   Preferred imaging location?    Answer:   [place]   No orders of the defined types were placed in this encounter.    Discussed warning signs or symptoms. Please see discharge instructions. Patient expresses understanding.   The above documentation has been reviewed and is accurate and complete Cheryl [name], M.D.

## 2022-04-13 LAB — HM DIABETES EYE EXAM

## 2022-04-13 NOTE — Telephone Encounter (Signed)
I called Cheryl [name].   The SI joint injection did help the axial back pain but not the radicular pain at all.  She has had several epidural steroid injections with over the last year and a half with little benefit.  Plan for repeat lumbar spine MRI to further understand source of radiating pain down her leg to better plan for further back procedures or injection or treatment.

## 2022-04-15 ENCOUNTER — Encounter: Payer: Self-pay | Admitting: *Deleted

## 2022-04-22 ENCOUNTER — Encounter: Payer: Self-pay | Admitting: Family Medicine

## 2022-04-22 DIAGNOSIS — E1165 Type 2 diabetes mellitus with hyperglycemia: Secondary | ICD-10-CM

## 2022-04-22 DIAGNOSIS — E1169 Type 2 diabetes mellitus with other specified complication: Secondary | ICD-10-CM

## 2022-04-26 ENCOUNTER — Ambulatory Visit: Payer: BC Managed Care – PPO | Admitting: Family Medicine

## 2022-04-26 ENCOUNTER — Telehealth (HOSPITAL_COMMUNITY): Payer: Self-pay | Admitting: *Deleted

## 2022-04-26 ENCOUNTER — Encounter: Payer: Self-pay | Admitting: Family Medicine

## 2022-04-26 VITALS — BP 130/90 | HR 104 | Temp 98.6°F | Resp 16 | Ht 66.0 in | Wt 203.6 lb

## 2022-04-26 DIAGNOSIS — N926 Irregular menstruation, unspecified: Secondary | ICD-10-CM

## 2022-04-26 DIAGNOSIS — E1165 Type 2 diabetes mellitus with hyperglycemia: Secondary | ICD-10-CM

## 2022-04-26 DIAGNOSIS — I10 Essential (primary) hypertension: Secondary | ICD-10-CM

## 2022-04-26 DIAGNOSIS — Z23 Encounter for immunization: Secondary | ICD-10-CM | POA: Diagnosis not present

## 2022-04-26 DIAGNOSIS — Z3A01 Less than 8 weeks gestation of pregnancy: Secondary | ICD-10-CM | POA: Diagnosis not present

## 2022-04-26 DIAGNOSIS — E785 Hyperlipidemia, unspecified: Secondary | ICD-10-CM

## 2022-04-26 DIAGNOSIS — F17210 Nicotine dependence, cigarettes, uncomplicated: Secondary | ICD-10-CM

## 2022-04-26 DIAGNOSIS — F3181 Bipolar II disorder: Secondary | ICD-10-CM

## 2022-04-26 DIAGNOSIS — E039 Hypothyroidism, unspecified: Secondary | ICD-10-CM

## 2022-04-26 DIAGNOSIS — E1169 Type 2 diabetes mellitus with other specified complication: Secondary | ICD-10-CM

## 2022-04-26 LAB — COMPREHENSIVE METABOLIC PANEL
ALT: 45 U/L — ABNORMAL HIGH (ref 0–35)
AST: 26 U/L (ref 0–37)
Albumin: 4.7 g/dL (ref 3.5–5.2)
Alkaline Phosphatase: 70 U/L (ref 39–117)
BUN: 14 mg/dL (ref 6–23)
CO2: 27 mEq/L (ref 19–32)
Calcium: 10 mg/dL (ref 8.4–10.5)
Chloride: 97 mEq/L (ref 96–112)
Creatinine, Ser: 0.81 mg/dL (ref 0.40–1.20)
GFR: 87.07 mL/min (ref >60.00)
Glucose, Bld: 134 mg/dL — ABNORMAL HIGH (ref 70–99)
Potassium: 4.3 mEq/L (ref 3.5–5.1)
Sodium: 137 mEq/L (ref 135–145)
Total Bilirubin: 0.4 mg/dL (ref 0.2–1.2)
Total Protein: 7.4 g/dL (ref 6.0–8.3)

## 2022-04-26 LAB — CBC WITH DIFFERENTIAL/PLATELET
Basophils Absolute: 0.1 10*3/uL (ref 0.0–0.1)
Basophils Relative: 0.6 % (ref 0.0–3.0)
Eosinophils Absolute: 0.1 10*3/uL (ref 0.0–0.7)
Eosinophils Relative: 1.1 % (ref 0.0–5.0)
HCT: 42 % (ref 36.0–46.0)
Hemoglobin: 14 g/dL (ref 12.0–15.0)
Lymphocytes Relative: 31.1 % (ref 12.0–46.0)
Lymphs Abs: 3.5 10*3/uL (ref 0.7–4.0)
MCHC: 33.4 g/dL (ref 30.0–36.0)
MCV: 90.5 fl (ref 78.0–100.0)
Monocytes Absolute: 0.7 10*3/uL (ref 0.1–1.0)
Monocytes Relative: 5.9 % (ref 3.0–12.0)
Neutro Abs: 6.9 10*3/uL (ref 1.4–7.7)
Neutrophils Relative %: 61.3 % (ref 43.0–77.0)
Platelets: 301 10*3/uL (ref 150.0–400.0)
RBC: 4.63 Mil/uL (ref 3.87–5.11)
RDW: 13.2 % (ref 11.5–15.5)
WBC: 11.3 10*3/uL — ABNORMAL HIGH (ref 4.0–10.5)

## 2022-04-26 LAB — LIPID PANEL
Cholesterol: 250 mg/dL — ABNORMAL HIGH (ref 0–200)
HDL: 54.6 mg/dL (ref >39.00)
NonHDL: 194.9
Total CHOL/HDL Ratio: 5
Triglycerides: 378 mg/dL — ABNORMAL HIGH (ref 0.0–149.0)
VLDL: 75.6 mg/dL — ABNORMAL HIGH (ref 0.0–40.0)

## 2022-04-26 LAB — HEMOGLOBIN A1C: Hgb A1c MFr Bld: 7.1 % — ABNORMAL HIGH (ref 4.6–6.5)

## 2022-04-26 LAB — LDL CHOLESTEROL, DIRECT: Direct LDL: 169 mg/dL

## 2022-04-26 LAB — TSH: TSH: 0.37 u[IU]/mL (ref 0.35–5.50)

## 2022-04-26 LAB — POCT URINE PREGNANCY: Preg Test, Ur: POSITIVE — AB

## 2022-04-26 LAB — HCG, SERUM, QUALITATIVE: Preg, Serum: POSITIVE — AB

## 2022-04-26 NOTE — Assessment & Plan Note (Signed)
Discuss meds with psych and ob

## 2022-04-26 NOTE — Telephone Encounter (Signed)
Got a called from [facility] at [facility] and the STAT lab was abnormal HCG. It was positive so pt is pregnant.

## 2022-04-26 NOTE — Assessment & Plan Note (Signed)
Check labs Pt has not been to endo

## 2022-04-26 NOTE — Telephone Encounter (Signed)
She need to stop all psychiatric medications.

## 2022-04-26 NOTE — Progress Notes (Signed)
Subjective:   By signing my name below, I, Cheryl [name], attest that this documentation has been prepared under the direction and in the presence of Ann Held DO 04/26/2022   Patient ID: Cheryl [name], female    DOB: [birth year], 46 y.o.   MRN: [id number]  Chief Complaint  Patient presents with   Possible Pregnancy    Pt had a positive preg test last Thursday. Pt states this is her first pregnancy    HPI Patient is in today for an office visit  She reports of a possible pregnancy. She had a positive test on 04/23/2022. She also reports that the first day of her last menstrual cycle was on 03/24/2022. She has not made an appointment with an Ob/GYN yet. She is currently [redacted] weeks pregnant. She is due on 12/29/2022. She is diagnosed with Bipolar II disorder and is wondering how to maintain her medications. She has not scheduled an appointment with her psychiatrist yet. She is currently smoking and reports that she has cut down drastically. She did not respond well to medications that could help her quit smoking. She is currently taking a prenatal supplement.   She does not follow up with Dr. Kelton Pillar  She reports that she is UTD on her influenza and covid vaccines. She is interested in receiving a tetanus vaccine during today's visit.   Past Medical History:  Diagnosis Date   Anxiety    Bipolar 1 disorder ([place])    Bipolar 2 disorder ([place])    Depression    Diabetes mellitus without complication ([place])    Kidney stone    Migraines    with aura   OSA (obstructive sleep apnea) 10/17/2013   Thyroid disease     Past Surgical History:  Procedure Laterality Date   INTRAUTERINE DEVICE (IUD) INSERTION  10/2011   inserted & removed 2018   NASAL SEPTUM SURGERY     OTHER SURGICAL HISTORY     endometrial polyps (2)   WISDOM TOOTH EXTRACTION      Family History  Problem Relation Age of Onset   Cancer Mother 61       ovarian   Hypothyroidism Mother    Stroke Mother 38    Hypertension Father    Diabetes Father    Hyperlipidemia Father    Heart disease Father        cabg--stint   Migraines Father    Stroke Father    Hypothyroidism Sister    Depression Sister    COPD Maternal Grandmother    Cancer Maternal Grandmother        lung   Hypertension Maternal Grandmother    Depression Maternal Grandmother    Stroke Maternal Grandmother    Cancer Maternal Grandfather        prostate, lung   Diabetes Paternal Grandmother    Stroke Paternal Grandmother    Alcohol abuse Paternal Grandfather    Aneurysm Paternal Grandfather    Cancer Maternal Aunt        breast    Cancer Maternal Uncle        throat   Depression Cousin        bipolar   Stomach cancer Neg Hx    Esophageal cancer Neg Hx     Social History   Socioeconomic History   Marital status: Married    Spouse name: Not on file   Number of children: 0   Years of education: Not on file   Highest education level: Not on  file  Occupational History   Occupation: Designer, multimedia: [employer]   Occupation: Product/process development scientist  Tobacco Use   Smoking status: Every Day    Packs/day: 1.00    Years: 20.00    Total pack years: 20.00    Types: Cigarettes   Smokeless tobacco: Never  Vaping Use   Vaping Use: Never used  Substance and Sexual Activity   Alcohol use: No    Alcohol/week: 0.0 standard drinks of alcohol   Drug use: No   Sexual activity: Yes    Partners: Male    Birth control/protection: None  Other Topics Concern   Not on file  Social History Narrative   Exercise-- no   Social Determinants of Health   Financial Resource Strain: Not on file  Food Insecurity: Not on file  Transportation Needs: Not on file  Physical Activity: Not on file  Stress: Not on file  Social Connections: Not on file  Intimate Partner Violence: Not on file    Outpatient Medications Prior to Visit  Medication Sig Dispense Refill   gabapentin (NEURONTIN) 300 MG capsule Take  1-2 capsules (300-600 mg total) by mouth 3 (three) times daily as needed. 90 capsule 3   ibuprofen (ADVIL,MOTRIN) 200 MG tablet Take 400 mg by mouth every 6 (six) hours as needed.     lamoTRIgine (LAMICTAL) 150 MG tablet Take 1 tablet (150 mg total) by mouth 2 (two) times daily. 60 tablet 2   levothyroxine (SYNTHROID) 200 MCG tablet Take 1 tablet (200 mcg total) by mouth daily. 90 tablet 3   metFORMIN (GLUCOPHAGE-XR) 500 MG 24 hr tablet Take 2 tablets (1,000 mg total) by mouth daily in the afternoon. 180 tablet 3   naproxen sodium (ANAPROX) 220 MG tablet Take 220 mg by mouth 2 (two) times daily as needed (pain).     ondansetron (ZOFRAN) 4 MG tablet Take 1 tablet (4 mg total) by mouth every 8 (eight) hours as needed for nausea or vomiting. 20 tablet 0   ONETOUCH DELICA LANCETS FINE MISC As directed qd 100 each 1   ONETOUCH VERIO test strip 1 each by Other route daily in the afternoon. Use to check blood sugar once a day. DX E11.51, E11.65 100 each 3   pantoprazole (PROTONIX) 40 MG tablet Take 1 tablet (40 mg total) by mouth 2 (two) times daily before a meal. 60 tablet 0   rizatriptan (MAXALT-MLT) 10 MG disintegrating tablet DISSOLVE ONE TABLET IN THE MOUTH DAILY AS NEEDED FOR MIGRAINE. MAY REPEAT IN 2 HOURS IF NEEDED. 10 tablet 0   Semaglutide (RYBELSUS) 14 MG TABS Take 14 mg by mouth daily. 30 tablet 6   sertraline (ZOLOFT) 100 MG tablet Take 2 tablets (200 mg total) by mouth daily. 60 tablet 2   clonazePAM (KLONOPIN) 0.5 MG tablet TAKE ONE (1) TABLET BY MOUTH EVERY DAY AS NEEDED FOR ANXIETY (Patient not taking: Reported on 04/06/2022) 30 tablet 1   rosuvastatin (CRESTOR) 10 MG tablet Take 1 tablet (10 mg total) by mouth daily. Pt needs office visit for more refills (Patient not taking: Reported on 12/28/2021) 30 tablet 0   No facility-administered medications prior to visit.    Allergies  Allergen Reactions   Augmentin [Amoxicillin-Pot Clavulanate] Diarrhea   Food Itching    Bison-throat  tingling    Review of Systems  Constitutional:  Negative for fever.  HENT:  Negative for congestion.   Eyes:  Negative for blurred vision.  Respiratory:  Negative for  cough.   Cardiovascular:  Negative for chest pain and palpitations.  Gastrointestinal:  Negative for vomiting.  Musculoskeletal:  Negative for back pain.  Skin:  Negative for rash.  Neurological:  Negative for loss of consciousness and headaches.       Objective:    Physical Exam Vitals and nursing note reviewed.  Constitutional:      General: She is not in acute distress.    Appearance: Normal appearance. She is not ill-appearing.  HENT:     Head: Normocephalic and atraumatic.     Right Ear: External ear normal.     Left Ear: External ear normal.  Eyes:     Extraocular Movements: Extraocular movements intact.     Pupils: Pupils are equal, round, and reactive to light.  Cardiovascular:     Rate and Rhythm: Normal rate and regular rhythm.     Heart sounds: Normal heart sounds. No murmur heard.    No gallop.  Pulmonary:     Effort: Pulmonary effort is normal. No respiratory distress.     Breath sounds: Normal breath sounds. No wheezing or rales.  Skin:    General: Skin is warm and dry.  Neurological:     Mental Status: She is alert and oriented to person, place, and time.  Psychiatric:        Judgment: Judgment normal.     BP (!) 130/90 (BP Location: Left Arm, Patient Position: Sitting, Cuff Size: Normal)   Pulse (!) 104   Temp 98.6 F (37 C) (Oral)   Resp 16   Ht '5\' 6"'$  (1.676 m)   Wt 203 lb 9.6 oz (92.4 kg)   LMP 03/24/2022   SpO2 97%   BMI 32.86 kg/m  Wt Readings from Last 3 Encounters:  04/26/22 203 lb 9.6 oz (92.4 kg)  04/07/22 209 lb (94.8 kg)  12/28/21 207 lb (93.9 kg)    Diabetic Foot Exam - Simple   No data filed    Lab Results  Component Value Date   WBC 9.1 12/28/2021   HGB 14.3 12/28/2021   HCT 42.0 12/28/2021   PLT 244.0 12/28/2021   GLUCOSE 129 (H) 12/28/2021   CHOL  342 (H) 10/23/2020   TRIG (H) 10/23/2020    569.0 Triglyceride is over 400; calculations on Lipids are invalid.   HDL 45.20 10/23/2020   LDLDIRECT 238.0 10/23/2020   LDLCALC 115 (H) 01/07/2017   ALT 50 (H) 12/28/2021   AST 30 12/28/2021   NA 137 12/28/2021   K 4.1 12/28/2021   CL 99 12/28/2021   CREATININE 0.79 12/28/2021   BUN 14 12/28/2021   CO2 26 12/28/2021   TSH 1.22 04/28/2021   HGBA1C 8.7 (A) 04/28/2021   MICROALBUR 8.5 (H) 10/23/2020    Lab Results  Component Value Date   TSH 1.22 04/28/2021   Lab Results  Component Value Date   WBC 9.1 12/28/2021   HGB 14.3 12/28/2021   HCT 42.0 12/28/2021   MCV 90.3 12/28/2021   PLT 244.0 12/28/2021   Lab Results  Component Value Date   NA 137 12/28/2021   K 4.1 12/28/2021   CO2 26 12/28/2021   GLUCOSE 129 (H) 12/28/2021   BUN 14 12/28/2021   CREATININE 0.79 12/28/2021   BILITOT 0.5 12/28/2021   ALKPHOS 68 12/28/2021   AST 30 12/28/2021   ALT 50 (H) 12/28/2021   PROT 8.1 12/28/2021   ALBUMIN 4.8 12/28/2021   CALCIUM 10.1 12/28/2021   ANIONGAP 8 11/21/2020   GFR 89.93  12/28/2021   Lab Results  Component Value Date   CHOL 342 (H) 10/23/2020   Lab Results  Component Value Date   HDL 45.20 10/23/2020   Lab Results  Component Value Date   LDLCALC 115 (H) 01/07/2017   Lab Results  Component Value Date   TRIG (H) 10/23/2020    569.0 Triglyceride is over 400; calculations on Lipids are invalid.   Lab Results  Component Value Date   CHOLHDL 8 10/23/2020   Lab Results  Component Value Date   HGBA1C 8.7 (A) 04/28/2021       Assessment & Plan:   Problem List Items Addressed This Visit       Unprioritized   Uncontrolled type 2 diabetes mellitus with hyperglycemia ([place])   Relevant Orders   CBC with Differential/Platelet   Comprehensive metabolic panel   Hemoglobin A1c   TSH   Lipid panel   Less than [redacted] weeks gestation of pregnancy - Primary    Refer to OB Check labs  Pt is high risk due to dm ii   bipolar, smoking and her age       Relevant Orders   Ambulatory referral to Obstetrics / Gynecology   hCG, serum, qualitative (Completed)   Tdap vaccine greater than or equal to 7yo IM (Completed)   Hypothyroidism   Relevant Orders   CBC with Differential/Platelet   Comprehensive metabolic panel   Hemoglobin A1c   TSH   Lipid panel   Hyperlipidemia LDL goal <70    Encourage heart healthy diet such as MIND or DASH diet, increase exercise, avoid trans fats, simple carbohydrates and processed foods, consider a krill or fish or flaxseed oil cap daily.        Cigarette smoker    D/w pts to quit smoking  And the importance of quitting for the baby      Bipolar II disorder ([place])    Discuss meds with psych and ob      Other Visit Diagnoses     Missed period       Relevant Orders   POCT urine pregnancy (Completed)   Primary hypertension       Relevant Orders   CBC with Differential/Platelet   Comprehensive metabolic panel   Hemoglobin A1c   TSH   Lipid panel   Hyperlipidemia associated with type 2 diabetes mellitus ([place])       Relevant Orders   CBC with Differential/Platelet   Comprehensive metabolic panel   Hemoglobin A1c   TSH   Lipid panel      No orders of the defined types were placed in this encounter.   IAnn Held, DO, personally preformed the services described in this documentation.  All medical record entries made by the scribe were at my direction and in my presence.  I have reviewed the chart and discharge instructions (if applicable) and agree that the record reflects my personal performance and is accurate and complete. 04/26/2022   I,Amber Collins,acting as a scribe for Ann Held, DO.,have documented all relevant documentation on the behalf of Ann Held, DO,as directed by  Ann Held, DO while in the presence of Ann Held, DO.    Ann Held, DO

## 2022-04-26 NOTE — Patient Instructions (Signed)
Eating Plan for Pregnant Women While you are pregnant, your body requires additional nutrition to help support your growing baby. You also have a higher need for some vitamins and minerals, such as folic acid, calcium, iron, and vitamin D. Eating a healthy, well-balanced diet is very important for your health and your baby's health. Your need for extra calories varies over the course of your pregnancy. Pregnancy is divided into three trimesters, with each trimester lasting 3 months. For most women, it is recommended to consume: 150 extra calories a day during the first trimester. 300 extra calories a day during the second trimester. 300 extra calories a day during the third trimester. What are tips for following this plan? Cooking Practice good food safety and cleanliness. Wash your hands before you eat and after you prepare raw meat. Wash all fruits and vegetables well before peeling or eating. Taking these actions can help to prevent foodborne illnesses that can be very dangerous to your baby, such as listeriosis. Ask your health care provider for more information about listeriosis. Make sure that all meats, poultry, and eggs are cooked to food-safe temperatures or "well-done." Meal planning  Eat a variety of foods (especially fruits and vegetables) to get a full range of vitamins and minerals. Two or more servings of fish are recommended each week in order to get the most benefits from omega-3 fatty acids that are found in seafood. Choose fish that are lower in mercury, such as salmon and pollock. Limit your overall intake of foods that have "empty calories." These are foods that have little nutritional value, such as sweets, desserts, candies, and sugar-sweetened beverages. Drinks that contain caffeine are okay to drink, but it is better to avoid caffeine. Keep your total caffeine intake to less than 200 mg each day (which is 12 oz or 355 mL of coffee, tea, or soda) or the limit as told by your  health care provider. General information Do not try to lose weight or go on a diet during pregnancy. Take a prenatal vitamin to help meet your additional vitamin and mineral needs during pregnancy, specifically for folic acid, iron, calcium, and vitamin D. Remember to stay active. Ask your health care provider what types of exercise and activities are safe for you. What does 150 extra calories look like? Healthy options that provide 150 extra calories each day could be any of the following: 6-8 oz (170-227 g) plain low-fat yogurt with  cup (70 g) berries. 1 apple with 2 tsp (11 g) peanut butter. Cut-up vegetables with  cup (60 g) hummus. 8 fl oz (237 mL) low-fat chocolate milk. 1 stick of string cheese with 1 medium orange. 1 peanut butter and jelly sandwich that is made with one slice of whole-wheat bread and 1 tsp (5 g) of peanut butter. For 300 extra calories, you could eat two of these healthy options each day. What is a healthy amount of weight to gain? The right amount of weight gain for you is based on your BMI (body mass index) before you became pregnant. If your BMI was less than 18 (underweight), you should gain 28-40 lb (13-18 kg). If your BMI was 18-24.9 (normal), you should gain 25-35 lb (11-16 kg). If your BMI was 25-29.9 (overweight), you should gain 15-25 lb (7-11 kg). If your BMI was 30 or greater (obese), you should gain 11-20 lb (5-9 kg). What if I am having twins or multiples? Generally, if you are carrying twins or multiples: You may need to eat  300-600 extra calories a day. The recommended range for total weight gain is 25-54 lb (11-25 kg), depending on your BMI before pregnancy. Talk with your health care provider to find out about nutritional needs, weight gain, and exercise that is right for you. What foods should I eat?  Fruits All fruits. Eat a variety of colors and types of fruit. Remember to wash your fruits well before peeling or eating. Vegetables All  vegetables. Eat a variety of colors and types of vegetables. Remember to wash your vegetables well before peeling or eating. Grains All grains. Choose whole grains, such as whole-wheat bread, oatmeal, or brown rice. Meats and other protein foods Lean meats, including chicken, [place], and lean cuts of beef, veal, or pork. Fish that is higher in omega-3 fatty acids and lower in mercury, such as salmon, herring, mussels, trout, sardines, pollock, shrimp, crab, and lobster. Tofu. Tempeh. Beans. Eggs. Peanut butter and other nut butters. Dairy Pasteurized milk and milk alternatives, such as almond milk. Pasteurized yogurt and pasteurized cheese. Cottage cheese. Sour cream. Beverages Water. Juices that contain 100% fruit juice or vegetable juice. Caffeine-free teas and decaffeinated coffee. Fats and oils Fats and oils are okay to include in moderation. Sweets and desserts Sweets and desserts are okay to include in moderation. Seasoning and other foods All pasteurized condiments. The items listed above may not be a complete list of foods and beverages you can eat. Contact a dietitian for more information. What foods should I avoid? Fruits Raw (unpasteurized) fruit juices. Vegetables Unpasteurized vegetable juices. Meats and other protein foods Precooked or cured meat, such as bologna, hot dogs, sausages, or meat loaves. (If you must eat those meats, reheat them until they are steaming hot.) Refrigerated pate, meat spreads from a meat counter, or smoked seafood that is found in the refrigerated section of a store. Raw or undercooked meats, poultry, and eggs. Raw fish, such as sushi or sashimi. Fish that have high mercury content, such as tilefish, shark, swordfish, and king mackerel. Dairy Unpasteurized milk and any foods that have unpasteurized milk in them. Soft cheeses, such as feta, queso blanco, queso fresco, [place], [place], panela, and blue-veined cheeses (unless they are made with pasteurized  milk, which must be stated on the label). Beverages Alcohol. Sugar-sweetened beverages, such as sodas, teas, or energy drinks. Seasoning and other foods Homemade fermented foods and drinks, such as pickles, sauerkraut, or kombucha drinks. (Store-bought pasteurized versions of these are okay.) Salads that are made in a store or deli, such as ham salad, chicken salad, egg salad, tuna salad, and seafood salad. The items listed above may not be a complete list of foods and beverages you should avoid. Contact a dietitian for more information. Where to find more information To calculate the number of calories you need based on your height, weight, and activity level, you can use an online calculator such as: https://www.hunter.com/ To calculate how much weight you should gain during pregnancy, you can use an online pregnancy weight gain calculator such as: MassVoice.es To learn more about eating fish during pregnancy, talk with your health care provider or visit: [place]G[employer].ch Summary While you are pregnant, your body requires additional nutrition to help support your growing baby. Eat a variety of foods, especially fruits and vegetables, to get a full range of vitamins and minerals. Practice good food safety and cleanliness. Wash your hands before you eat and after you prepare raw meat. Wash all fruits and vegetables well before peeling or eating. Taking these actions can  help to prevent foodborne illnesses, such as listeriosis, that can be very dangerous to your baby. Do not eat raw meat or fish. Do not eat fish that have high mercury content, such as tilefish, shark, swordfish, and king mackerel. Do not eat raw (unpasteurized) dairy. Take a prenatal vitamin to help meet your additional vitamin and mineral needs during pregnancy, specifically for folic acid, iron, calcium, and vitamin D. This information is not intended to replace advice given to you by your health care provider. Make sure you  discuss any questions you have with your health care provider. Document Revised: 01/03/2020 Document Reviewed: 01/03/2020 Elsevier Patient Education  [place].

## 2022-04-26 NOTE — Telephone Encounter (Signed)
Pt called to advise that she is currently pregnant (less than 8 weeks per chart) and would like to know what to do about her medications:  Zoloft 200 mg qd Klonopin 0.5 mg qd prn Lamictal 150 mg bid  Looks like pt has asked OB/GYN for referral to high risk OB FYI. Please review and advise.

## 2022-04-26 NOTE — Assessment & Plan Note (Signed)
Encourage heart healthy diet such as MIND or DASH diet, increase exercise, avoid trans fats, simple carbohydrates and processed foods, consider a krill or fish or flaxseed oil cap daily.  °

## 2022-04-26 NOTE — Assessment & Plan Note (Signed)
Refer to OB Check labs  Pt is high risk due to dm ii  bipolar, smoking and her age

## 2022-04-26 NOTE — Assessment & Plan Note (Signed)
D/w pts to quit smoking  And the importance of quitting for the baby

## 2022-04-27 NOTE — Telephone Encounter (Signed)
LVM

## 2022-04-29 ENCOUNTER — Other Ambulatory Visit: Payer: Self-pay | Admitting: Family Medicine

## 2022-04-29 ENCOUNTER — Other Ambulatory Visit: Payer: Self-pay | Admitting: Internal Medicine

## 2022-04-29 DIAGNOSIS — E1169 Type 2 diabetes mellitus with other specified complication: Secondary | ICD-10-CM

## 2022-04-29 DIAGNOSIS — E1165 Type 2 diabetes mellitus with hyperglycemia: Secondary | ICD-10-CM

## 2022-05-02 ENCOUNTER — Other Ambulatory Visit: Payer: Self-pay | Admitting: Family Medicine

## 2022-05-02 DIAGNOSIS — Z3A01 Less than 8 weeks gestation of pregnancy: Secondary | ICD-10-CM

## 2022-05-02 DIAGNOSIS — E1165 Type 2 diabetes mellitus with hyperglycemia: Secondary | ICD-10-CM

## 2022-05-03 ENCOUNTER — Telehealth: Payer: Self-pay | Admitting: Family Medicine

## 2022-05-03 NOTE — Telephone Encounter (Signed)
Results faxed.

## 2022-05-03 NOTE — Telephone Encounter (Signed)
Reviewed labs with patient, voiced understanding.  Patient has not been taking Crestor, needs refills.   Okay to refill or plan to change medication d/t pregnancy?

## 2022-05-03 NOTE — Telephone Encounter (Signed)
Pt called stating that she is attempting to make an appt with [facility] but they require the lab results of her pregnancy. Pt would like o have that sent over to the following:  [address], [place] [address] Phone: [phone] Fax: [phone]

## 2022-06-01 NOTE — Addendum Note (Signed)
Addended by: Gerilyn Nestle on: 06/01/2022 02:51 PM   Modules accepted: Orders

## 2022-06-01 NOTE — Telephone Encounter (Signed)
Please see MyChart message from patient regarding miscarriage.  The requested medications have been pended for your refill/restart approval.

## 2022-06-02 MED ORDER — METFORMIN HCL ER 500 MG PO TB24: 1000.0000 mg | ORAL_TABLET | Freq: Every day | ORAL | 1 refills | Status: DC

## 2022-06-02 MED ORDER — LEVOTHYROXINE SODIUM 200 MCG PO TABS: 200.0000 ug | ORAL_TABLET | Freq: Every day | ORAL | 1 refills | Status: DC

## 2022-06-02 MED ORDER — ROSUVASTATIN CALCIUM 10 MG PO TABS: 10.0000 mg | ORAL_TABLET | Freq: Every day | ORAL | 0 refills | Status: DC

## 2022-06-02 NOTE — Telephone Encounter (Signed)
Discussed with provider that patient miscarried, verbal orders to refill/restart medications provided.

## 2022-06-02 NOTE — Addendum Note (Signed)
Addended by: Gerilyn Nestle on: 06/02/2022 01:44 PM   Modules accepted: Orders

## 2022-06-03 ENCOUNTER — Other Ambulatory Visit: Payer: Self-pay | Admitting: Internal Medicine

## 2022-06-03 ENCOUNTER — Other Ambulatory Visit: Payer: Self-pay | Admitting: Family Medicine

## 2022-07-06 ENCOUNTER — Telehealth (HOSPITAL_COMMUNITY): Payer: BC Managed Care – PPO | Admitting: Psychiatry

## 2022-09-10 ENCOUNTER — Other Ambulatory Visit: Payer: Self-pay | Admitting: Family Medicine

## 2022-09-13 NOTE — Telephone Encounter (Signed)
Patient called to let Dr Georgina Snell know that she is requesting this medication.  (She was concerned that there could be hesitation with refilling this due to the last time she saw Dr Georgina Snell, she was pregnant but she wanted to let him know that she is no longer pregnant and able to take this medication).  I informed her that Dr Georgina Snell is out of the office this week. Would Dr Glennon Mac be willing to refill this for her (or a small amount to get her through until Dr Georgina Snell is back)?

## 2022-09-13 NOTE — Telephone Encounter (Signed)
I have provided a 60 tablet refill that should allow patient to have enough medication until Dr. Georgina Snell can be back and review if he wants to continue prescribing this medication for patient.

## 2022-10-01 ENCOUNTER — Other Ambulatory Visit: Payer: Self-pay | Admitting: Sports Medicine

## 2022-10-18 ENCOUNTER — Encounter: Payer: Self-pay | Admitting: Family Medicine

## 2022-10-18 ENCOUNTER — Ambulatory Visit (INDEPENDENT_AMBULATORY_CARE_PROVIDER_SITE_OTHER): Payer: 59 | Admitting: Family Medicine

## 2022-10-18 ENCOUNTER — Other Ambulatory Visit: Payer: Self-pay | Admitting: Family Medicine

## 2022-10-18 VITALS — BP 118/74 | HR 98 | Temp 98.3°F | Resp 18 | Ht 66.0 in | Wt 210.8 lb

## 2022-10-18 DIAGNOSIS — E785 Hyperlipidemia, unspecified: Secondary | ICD-10-CM

## 2022-10-18 DIAGNOSIS — E1169 Type 2 diabetes mellitus with other specified complication: Secondary | ICD-10-CM | POA: Diagnosis not present

## 2022-10-18 DIAGNOSIS — Z7984 Long term (current) use of oral hypoglycemic drugs: Secondary | ICD-10-CM

## 2022-10-18 DIAGNOSIS — E039 Hypothyroidism, unspecified: Secondary | ICD-10-CM | POA: Diagnosis not present

## 2022-10-18 DIAGNOSIS — Z0000 Encounter for general adult medical examination without abnormal findings: Secondary | ICD-10-CM

## 2022-10-18 DIAGNOSIS — F17210 Nicotine dependence, cigarettes, uncomplicated: Secondary | ICD-10-CM

## 2022-10-18 DIAGNOSIS — E1165 Type 2 diabetes mellitus with hyperglycemia: Secondary | ICD-10-CM | POA: Diagnosis not present

## 2022-10-18 DIAGNOSIS — F3181 Bipolar II disorder: Secondary | ICD-10-CM

## 2022-10-18 DIAGNOSIS — I10 Essential (primary) hypertension: Secondary | ICD-10-CM | POA: Diagnosis not present

## 2022-10-18 DIAGNOSIS — E119 Type 2 diabetes mellitus without complications: Secondary | ICD-10-CM | POA: Insufficient documentation

## 2022-10-18 DIAGNOSIS — Z1159 Encounter for screening for other viral diseases: Secondary | ICD-10-CM

## 2022-10-18 NOTE — Assessment & Plan Note (Signed)
Encourage heart healthy diet such as MIND or DASH diet, increase exercise, avoid trans fats, simple carbohydrates and processed foods, consider a krill or fish or flaxseed oil cap daily.  °

## 2022-10-18 NOTE — Progress Notes (Signed)
Subjective:   By signing my name below, I, Isabelle Course, attest that this documentation has been prepared under the direction and in the presence of Donato Schultz, DO 10/18/22   Patient ID: Cheryl [name], female    DOB: [birth year], 47 y.o.   MRN: [id number]  Chief Complaint  Patient presents with   Annual Exam    Pt states not fasting     HPI Patient is in today for a comprehensive physical exam.   She recently had a miscarriage. She has followed up with [facility]. She is interested in possibly trying again or considering adoption. She is compliant with Zoloft and 60 mg Latuda.  She endorses back pain with repetitive strain.   She smokes about a pack a day for about 21 years.   Last pap: 10/12/2017. Results were abnormal.   Last mammogram: 10/18/2017. Results were normal.   Last colonoscopy: 10/10/2008. Results were normal. She is hesitant to schedule next colonoscopy due to waking up during her last colonoscopy.   She is UTD on routine vision care. She is not UTD on routine dental care.   Past Medical History:  Diagnosis Date   Anxiety    Bipolar 1 disorder (HCC)    Bipolar 2 disorder (HCC)    Depression    Diabetes mellitus without complication (HCC)    Kidney stone    Migraines    with aura   OSA (obstructive sleep apnea) 10/17/2013   Thyroid disease     Past Surgical History:  Procedure Laterality Date   INTRAUTERINE DEVICE (IUD) INSERTION  10/2011   inserted & removed 2018   NASAL SEPTUM SURGERY     OTHER SURGICAL HISTORY     endometrial polyps (2)   WISDOM TOOTH EXTRACTION      Family History  Problem Relation Age of Onset   Cancer Mother 68       ovarian   Hypothyroidism Mother    Stroke Mother 70   Hypertension Father    Diabetes Father    Hyperlipidemia Father    Heart disease Father        cabg--stint   Migraines Father    Stroke Father    Hypothyroidism Sister    Depression Sister    COPD Maternal Grandmother    Cancer  Maternal Grandmother        lung   Hypertension Maternal Grandmother    Depression Maternal Grandmother    Stroke Maternal Grandmother    Cancer Maternal Grandfather        prostate, lung   Diabetes Paternal Grandmother    Stroke Paternal Grandmother    Alcohol abuse Paternal Grandfather    Aneurysm Paternal Grandfather    Cancer Maternal Aunt        breast    Cancer Maternal Uncle        throat   Depression Cousin        bipolar   Stomach cancer Neg Hx    Esophageal cancer Neg Hx     Social History   Socioeconomic History   Marital status: Married    Spouse name: Not on file   Number of children: 0   Years of education: Not on file   Highest education level: Not on file  Occupational History   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, advertising: [employer]   Occupation: Psychologist, counselling  Tobacco Use   Smoking status: Every Day    Packs/day: 1.00    Years:  20.00    Additional pack years: 0.00    Total pack years: 20.00    Types: Cigarettes   Smokeless tobacco: Never  Vaping Use   Vaping Use: Never used  Substance and Sexual Activity   Alcohol use: No    Alcohol/week: 0.0 standard drinks of alcohol   Drug use: No   Sexual activity: Yes    Partners: Male    Birth control/protection: None  Other Topics Concern   Not on file  Social History Narrative   Exercise-- no   Social Determinants of Health   Financial Resource Strain: Not on file  Food Insecurity: Not on file  Transportation Needs: Not on file  Physical Activity: Not on file  Stress: Not on file  Social Connections: Not on file  Intimate Partner Violence: Not on file    Outpatient Medications Prior to Visit  Medication Sig Dispense Refill   CONTOUR NEXT TEST test strip CHECK BLOOD SUGAR ONCE A DAY. 100 each 3   gabapentin (NEURONTIN) 300 MG capsule TAKE 1-2 CAPSULES BY MOUTH THREE TIMES DAILY AS NEEDED 60 capsule 0   lamoTRIgine (LAMICTAL) 150 MG tablet Take 1 tablet (150 mg total) by  mouth 2 (two) times daily. 60 tablet 2   levothyroxine (SYNTHROID) 200 MCG tablet Take 1 tablet (200 mcg total) by mouth daily. 90 tablet 1   Lurasidone HCl (LATUDA) 60 MG TABS Take by mouth. 30 tablet    metFORMIN (GLUCOPHAGE-XR) 500 MG 24 hr tablet Take 2 tablets (1,000 mg total) by mouth daily in the afternoon. 180 tablet 1   ONETOUCH DELICA LANCETS FINE MISC As directed qd 100 each 1   rizatriptan (MAXALT-MLT) 10 MG disintegrating tablet DISSOLVE ONE TABLET IN THE MOUTH DAILY AS NEEDED FOR MIGRAINE. MAY REPEAT IN 2 HOURS IF NEEDED. 10 tablet 0   rosuvastatin (CRESTOR) 10 MG tablet Take 1 tablet (10 mg total) by mouth daily. 90 tablet 0   sertraline (ZOLOFT) 100 MG tablet Take 2 tablets (200 mg total) by mouth daily. 60 tablet 2   pantoprazole (PROTONIX) 40 MG tablet Take 1 tablet (40 mg total) by mouth 2 (two) times daily before a meal. (Patient not taking: Reported on 10/18/2022) 60 tablet 0   ondansetron (ZOFRAN) 4 MG tablet Take 1 tablet (4 mg total) by mouth every 8 (eight) hours as needed for nausea or vomiting. (Patient not taking: Reported on 10/18/2022) 20 tablet 0   No facility-administered medications prior to visit.    Allergies  Allergen Reactions   Augmentin [Amoxicillin-Pot Clavulanate] Diarrhea   Food Itching    Bison-throat tingling    Review of Systems  Constitutional:  Negative for fever and malaise/fatigue.  HENT:  Negative for congestion.   Eyes:  Negative for blurred vision.  Respiratory:  Negative for shortness of breath.   Cardiovascular:  Negative for chest pain, palpitations and leg swelling.  Gastrointestinal:  Negative for abdominal pain, blood in stool and nausea.  Genitourinary:  Negative for dysuria and frequency.  Musculoskeletal:  Positive for back pain. Negative for falls.  Skin:  Negative for rash.  Neurological:  Negative for dizziness, loss of consciousness and headaches.  Endo/Heme/Allergies:  Negative for environmental allergies.   Psychiatric/Behavioral:  Negative for depression. The patient is not nervous/anxious.        Objective:    Physical Exam Vitals and nursing note reviewed.  Constitutional:      General: She is not in acute distress.    Appearance: She is well-developed.  HENT:  Head: Normocephalic and atraumatic.     Right Ear: External ear normal.     Left Ear: External ear normal.     Nose: Nose normal.  Eyes:     Conjunctiva/sclera: Conjunctivae normal.     Pupils: Pupils are equal, round, and reactive to light.  Neck:     Thyroid: No thyromegaly.     Vascular: No carotid bruit or JVD.  Cardiovascular:     Rate and Rhythm: Normal rate and regular rhythm.     Heart sounds: Normal heart sounds. No murmur heard. Pulmonary:     Effort: Pulmonary effort is normal. No respiratory distress.     Breath sounds: Normal breath sounds. No wheezing or rales.  Chest:     Chest wall: No tenderness.  Musculoskeletal:        General: Normal range of motion.     Cervical back: Normal range of motion and neck supple.  Skin:    Findings: No erythema or rash.  Neurological:     General: No focal deficit present.     Mental Status: She is alert and oriented to person, place, and time.  Psychiatric:        Behavior: Behavior normal.        Thought Content: Thought content normal.        Judgment: Judgment normal.     BP 118/74 (BP Location: Right Arm, Patient Position: Sitting, Cuff Size: Normal)   Pulse 98   Temp 98.3 F (36.8 C) (Oral)   Resp 18   Ht 5\' 6"  (1.676 m)   Wt 210 lb 12.8 oz (95.6 kg)   LMP 03/24/2022   SpO2 96%   BMI 34.02 kg/m  Wt Readings from Last 3 Encounters:  10/18/22 210 lb 12.8 oz (95.6 kg)  04/26/22 203 lb 9.6 oz (92.4 kg)  04/07/22 209 lb (94.8 kg)       Assessment & Plan:  Preventative health care -     Measles/Mumps/Rubella Immunity  Type 2 diabetes mellitus with hyperglycemia, without long-term current use of insulin (HCC) -     Comprehensive metabolic  panel -     Hemoglobin A1c -     Microalbumin / creatinine urine ratio  Hypothyroidism, unspecified type Assessment & Plan: On synthroid  Check labs   Orders: -     TSH  Primary hypertension -     CBC with Differential/Platelet  Hyperlipidemia associated with type 2 diabetes mellitus (HCC) -     Lipid panel -     Comprehensive metabolic panel  Bipolar II disorder (HCC) Assessment & Plan: Per psych   Need for hepatitis B screening test -     Hepatitis B surface antibody,quantitative  Diabetes mellitus treated with oral medication (HCC)  Hyperlipidemia LDL goal <70 Assessment & Plan: Encourage heart healthy diet such as MIND or DASH diet, increase exercise, avoid trans fats, simple carbohydrates and processed foods, consider a krill or fish or flaxseed oil cap daily.     Cigarette smoker Assessment & Plan: Pt still smoking 1ppd x 20 years       I,Rachel Rivera,acting as a scribe for [employer], DO.,have documented all relevant documentation on the behalf of Donato Schultz, DO,as directed by  Donato Schultz, DO while in the presence of Donato Schultz, DO.   I, Donato Schultz, DO, personally preformed the services described in this documentation.  All medical record entries made by the scribe were at  my direction and in my presence.  I have reviewed the chart and discharge instructions (if applicable) and agree that the record reflects my personal performance and is accurate and complete. 10/18/22   Donato Schultz, DO

## 2022-10-18 NOTE — Assessment & Plan Note (Signed)
Ghm utd Check labs  See AVS Health Maintenance  Topic Date Due   FOOT EXAM  08/12/2019   PAP SMEAR-Modifier  10/12/2020   COLONOSCOPY (Pts 45-36yrs Insurance coverage will need to be confirmed)  12/03/2020   Diabetic kidney evaluation - Urine ACR  10/23/2021   COVID-19 Vaccine (2 - 2023-24 season) 02/19/2022   HIV Screening  01/08/2028 (Originally [birth year]/04/1990)   HEMOGLOBIN A1C  10/25/2022   INFLUENZA VACCINE  01/20/2023   OPHTHALMOLOGY EXAM  04/14/2023   Diabetic kidney evaluation - eGFR measurement  04/27/2023   DTaP/Tdap/Td (4 - Td or Tdap) 04/26/2032   Hepatitis C Screening  Completed   HPV VACCINES  Aged Out

## 2022-10-18 NOTE — Assessment & Plan Note (Signed)
Per psych 

## 2022-10-18 NOTE — Assessment & Plan Note (Signed)
On synthroid Check labs 

## 2022-10-18 NOTE — Assessment & Plan Note (Signed)
Pt still smoking 1ppd x 20 years

## 2022-10-18 NOTE — Assessment & Plan Note (Signed)
hgba1c to be checked minimize simple carbs. Increase exercise as tolerated. Continue current meds 

## 2022-10-18 NOTE — Patient Instructions (Signed)
Preventive Care 40-47 Years Old, Female Preventive care refers to lifestyle choices and visits with your health care provider that can promote health and wellness. Preventive care visits are also called wellness exams. What can I expect for my preventive care visit? Counseling Your health care provider may ask you questions about your: Medical history, including: Past medical problems. Family medical history. Pregnancy history. Current health, including: Menstrual cycle. Method of birth control. Emotional well-being. Home life and relationship well-being. Sexual activity and sexual health. Lifestyle, including: Alcohol, nicotine or tobacco, and drug use. Access to firearms. Diet, exercise, and sleep habits. Work and work environment. Sunscreen use. Safety issues such as seatbelt and bike helmet use. Physical exam Your health care provider will check your: Height and weight. These may be used to calculate your BMI (body mass index). BMI is a measurement that tells if you are at a healthy weight. Waist circumference. This measures the distance around your waistline. This measurement also tells if you are at a healthy weight and may help predict your risk of certain diseases, such as type 2 diabetes and high blood pressure. Heart rate and blood pressure. Body temperature. Skin for abnormal spots. What immunizations do I need?  Vaccines are usually given at various ages, according to a schedule. Your health care provider will recommend vaccines for you based on your age, medical history, and lifestyle or other factors, such as travel or where you work. What tests do I need? Screening Your health care provider may recommend screening tests for certain conditions. This may include: Lipid and cholesterol levels. Diabetes screening. This is done by checking your blood sugar (glucose) after you have not eaten for a while (fasting). Pelvic exam and Pap test. Hepatitis B test. Hepatitis C  test. HIV (human immunodeficiency virus) test. STI (sexually transmitted infection) testing, if you are at risk. Lung cancer screening. Colorectal cancer screening. Mammogram. Talk with your health care provider about when you should start having regular mammograms. This may depend on whether you have a family history of breast cancer. BRCA-related cancer screening. This may be done if you have a family history of breast, ovarian, tubal, or peritoneal cancers. Bone density scan. This is done to screen for osteoporosis. Talk with your health care provider about your test results, treatment options, and if necessary, the need for more tests. Follow these instructions at home: Eating and drinking  Eat a diet that includes fresh fruits and vegetables, whole grains, lean protein, and low-fat dairy products. Take vitamin and mineral supplements as recommended by your health care provider. Do not drink alcohol if: Your health care provider tells you not to drink. You are pregnant, may be pregnant, or are planning to become pregnant. If you drink alcohol: Limit how much you have to 0-1 drink a day. Know how much alcohol is in your drink. In the [place] one drink equals one 12 oz bottle of beer (355 mL), one 5 oz glass of wine (148 mL), or one 1 oz glass of hard liquor (44 mL). Lifestyle Brush your teeth every morning and night with fluoride toothpaste. Floss one time each day. Exercise for at least 30 minutes 5 or more days each week. Do not use any products that contain nicotine or tobacco. These products include cigarettes, chewing tobacco, and vaping devices, such as e-cigarettes. If you need help quitting, ask your health care provider. Do not use drugs. If you are sexually active, practice safe sex. Use a condom or other form of protection to   prevent STIs. If you do not wish to become pregnant, use a form of birth control. If you plan to become pregnant, see your health care provider for a  prepregnancy visit. Take aspirin only as told by your health care provider. Make sure that you understand how much to take and what form to take. Work with your health care provider to find out whether it is safe and beneficial for you to take aspirin daily. Find healthy ways to manage stress, such as: Meditation, yoga, or listening to music. Journaling. Talking to a trusted person. Spending time with friends and family. Minimize exposure to UV radiation to reduce your risk of skin cancer. Safety Always wear your seat belt while driving or riding in a vehicle. Do not drive: If you have been drinking alcohol. Do not ride with someone who has been drinking. When you are tired or distracted. While texting. If you have been using any mind-altering substances or drugs. Wear a helmet and other protective equipment during sports activities. If you have firearms in your house, make sure you follow all gun safety procedures. Seek help if you have been physically or sexually abused. What's next? Visit your health care provider once a year for an annual wellness visit. Ask your health care provider how often you should have your eyes and teeth checked. Stay up to date on all vaccines. This information is not intended to replace advice given to you by your health care provider. Make sure you discuss any questions you have with your health care provider. Document Revised: 12/03/2020 Document Reviewed: 12/03/2020 Elsevier Patient Education  2023 Elsevier Inc.  

## 2022-10-19 LAB — COMPREHENSIVE METABOLIC PANEL
ALT: 35 U/L (ref 0–35)
AST: 20 U/L (ref 0–37)
Albumin: 4.4 g/dL (ref 3.5–5.2)
Alkaline Phosphatase: 71 U/L (ref 39–117)
BUN: 12 mg/dL (ref 6–23)
CO2: 30 mEq/L (ref 19–32)
Calcium: 9.5 mg/dL (ref 8.4–10.5)
Chloride: 96 mEq/L (ref 96–112)
Creatinine, Ser: 0.71 mg/dL (ref 0.40–1.20)
GFR: 101.64 mL/min (ref >60.00)
Glucose, Bld: 175 mg/dL — ABNORMAL HIGH (ref 70–99)
Potassium: 3.8 mEq/L (ref 3.5–5.1)
Sodium: 136 mEq/L (ref 135–145)
Total Bilirubin: 0.2 mg/dL (ref 0.2–1.2)
Total Protein: 7.2 g/dL (ref 6.0–8.3)

## 2022-10-19 LAB — CBC WITH DIFFERENTIAL/PLATELET
Basophils Absolute: 0.1 10*3/uL (ref 0.0–0.1)
Basophils Relative: 1.2 % (ref 0.0–3.0)
Eosinophils Absolute: 0.1 10*3/uL (ref 0.0–0.7)
Eosinophils Relative: 0.8 % (ref 0.0–5.0)
HCT: 41.1 % (ref 36.0–46.0)
Hemoglobin: 13.6 g/dL (ref 12.0–15.0)
Lymphocytes Relative: 38.7 % (ref 12.0–46.0)
Lymphs Abs: 4.3 10*3/uL — ABNORMAL HIGH (ref 0.7–4.0)
MCHC: 33.2 g/dL (ref 30.0–36.0)
MCV: 88.8 fl (ref 78.0–100.0)
Monocytes Absolute: 0.6 10*3/uL (ref 0.1–1.0)
Monocytes Relative: 5.4 % (ref 3.0–12.0)
Neutro Abs: 6 10*3/uL (ref 1.4–7.7)
Neutrophils Relative %: 53.9 % (ref 43.0–77.0)
Platelets: 283 10*3/uL (ref 150.0–400.0)
RBC: 4.63 Mil/uL (ref 3.87–5.11)
RDW: 13 % (ref 11.5–15.5)
WBC: 11 10*3/uL — ABNORMAL HIGH (ref 4.0–10.5)

## 2022-10-19 LAB — MEASLES/MUMPS/RUBELLA IMMUNITY
Mumps IgG: 36.5 AU/mL
Rubella: 1.77 Index
Rubeola IgG: >300 AU/mL

## 2022-10-19 LAB — MICROALBUMIN / CREATININE URINE RATIO
Creatinine,U: 36.9 mg/dL
Microalb Creat Ratio: 1.9 mg/g (ref 0.0–30.0)
Microalb, Ur: <0.7 mg/dL (ref 0.0–1.9)

## 2022-10-19 LAB — HEPATITIS B SURFACE ANTIBODY, QUANTITATIVE: Hep B S AB Quant (Post): <5 m[IU]/mL — ABNORMAL LOW (ref >=10)

## 2022-10-19 LAB — LIPID PANEL
Cholesterol: 194 mg/dL (ref 0–200)
HDL: 43.6 mg/dL (ref >39.00)
Total CHOL/HDL Ratio: 4
Triglycerides: 447 mg/dL — ABNORMAL HIGH (ref 0.0–149.0)

## 2022-10-19 LAB — LDL CHOLESTEROL, DIRECT: Direct LDL: 110 mg/dL

## 2022-10-19 LAB — TSH: TSH: 0.03 u[IU]/mL — ABNORMAL LOW (ref 0.35–5.50)

## 2022-10-19 LAB — HEMOGLOBIN A1C: Hgb A1c MFr Bld: 7.8 % — ABNORMAL HIGH (ref 4.6–6.5)

## 2022-10-25 ENCOUNTER — Ambulatory Visit: Payer: BC Managed Care – PPO | Admitting: Family Medicine

## 2022-10-27 ENCOUNTER — Other Ambulatory Visit: Payer: Self-pay

## 2022-10-27 MED ORDER — FENOFIBRATE 160 MG PO TABS: 160.0000 mg | ORAL_TABLET | Freq: Every day | ORAL | 2 refills | Status: DC

## 2022-10-28 ENCOUNTER — Ambulatory Visit (INDEPENDENT_AMBULATORY_CARE_PROVIDER_SITE_OTHER): Payer: 59

## 2022-10-28 DIAGNOSIS — Z23 Encounter for immunization: Secondary | ICD-10-CM | POA: Diagnosis not present

## 2022-10-28 NOTE — Progress Notes (Signed)
Cheryl [name] is a 47 y.o. female presents to the office today for #1/2 Heplisav-B injection, per physician's orders. Original order: 10/25/22: "Pt is not immune to hep b----- should have a booster" Heplisav-B 0.5 mL IM was administered L Deltoid today. Patient tolerated injection. Patient due for follow up labs/provider appt: No.  Patient next injection due: 1 month for #2/2, appt made Yes  Creft, Melton Alar L

## 2022-11-03 ENCOUNTER — Other Ambulatory Visit: Payer: Self-pay | Admitting: Family Medicine

## 2022-11-03 NOTE — Telephone Encounter (Signed)
Last OV 04/07/22 Next OV not scheduled  Last refill 10/01/22  Qty # 60/0

## 2022-11-29 ENCOUNTER — Ambulatory Visit (INDEPENDENT_AMBULATORY_CARE_PROVIDER_SITE_OTHER): Payer: 59

## 2022-11-29 DIAGNOSIS — Z23 Encounter for immunization: Secondary | ICD-10-CM | POA: Diagnosis not present

## 2022-11-29 NOTE — Progress Notes (Signed)
Cheryl [name] is a 47 y.o. female presents to the office today for #2  Heplisav-B injection, per physician's orders. Original order: 10/25/22: "Pt is not immune to hep b----- should have a booster" Heplisav-B 0.5 mL IM was administered L Deltoid today. Patient tolerated injection. Patient due for follow up labs/provider appt: No.

## 2022-12-10 ENCOUNTER — Other Ambulatory Visit: Payer: Self-pay | Admitting: Family Medicine

## 2022-12-10 ENCOUNTER — Encounter: Payer: Self-pay | Admitting: Family Medicine

## 2022-12-10 DIAGNOSIS — E1165 Type 2 diabetes mellitus with hyperglycemia: Secondary | ICD-10-CM

## 2022-12-10 MED ORDER — GLUCOSE BLOOD VI STRP: ORAL_STRIP | 12 refills | Status: AC

## 2022-12-27 ENCOUNTER — Other Ambulatory Visit: Payer: Self-pay | Admitting: Family Medicine

## 2023-01-14 ENCOUNTER — Other Ambulatory Visit: Payer: Self-pay | Admitting: Family Medicine

## 2023-01-14 DIAGNOSIS — E1169 Type 2 diabetes mellitus with other specified complication: Secondary | ICD-10-CM

## 2023-02-28 ENCOUNTER — Other Ambulatory Visit: Payer: Self-pay | Admitting: Family Medicine

## 2023-02-28 NOTE — Telephone Encounter (Signed)
Last OV 04/07/22 Next OV not scheduled  Last refill 11/03/22 Qty #60/2

## 2023-03-22 ENCOUNTER — Other Ambulatory Visit: Payer: Self-pay | Admitting: Family Medicine

## 2023-03-22 NOTE — Telephone Encounter (Signed)
Rx refill request approved per Dr. Corey's orders. 

## 2023-03-30 ENCOUNTER — Other Ambulatory Visit: Payer: Self-pay | Admitting: Family Medicine

## 2023-04-20 ENCOUNTER — Other Ambulatory Visit: Payer: Self-pay | Admitting: Family Medicine

## 2023-04-20 NOTE — Telephone Encounter (Signed)
Last OV 04/07/22 Next OV not scheduled  Last refill 03/22/23 Qty #60/0  Pt due for OV. Will send in 30 days supply and route encounter to Raynelle Fanning and [facility] to reach out to pt to assist with scheduling.

## 2023-04-28 ENCOUNTER — Ambulatory Visit: Payer: 59 | Admitting: Family Medicine

## 2023-05-12 ENCOUNTER — Ambulatory Visit (INDEPENDENT_AMBULATORY_CARE_PROVIDER_SITE_OTHER): Payer: 59 | Admitting: Family Medicine

## 2023-05-12 VITALS — BP 122/80 | HR 83 | Ht 66.0 in | Wt 203.0 lb

## 2023-05-12 DIAGNOSIS — M4306 Spondylolysis, lumbar region: Secondary | ICD-10-CM

## 2023-05-12 DIAGNOSIS — M5416 Radiculopathy, lumbar region: Secondary | ICD-10-CM | POA: Diagnosis not present

## 2023-05-12 MED ORDER — GABAPENTIN 300 MG PO CAPS: 600.0000 mg | ORAL_CAPSULE | Freq: Every day | ORAL | 3 refills | Status: DC

## 2023-05-12 NOTE — Progress Notes (Signed)
   Rubin Payor, PhD, LAT, ATC acting as a scribe for Cheryl Graham, MD.  Cheryl [name] is a 47 y.o. female who presents to [facility] today for a re-check of her LBP and rx refill. Pt was last seen by Dr. Denyse Amass on 04/08/23 and was given a R SI joint steroid injection. Last lumbar ESI was on 09/30/21.  Today, pt reports LBP is about the same. She feels like the gabapentin is helpful. She locates pain to the R-side of her low back. Radiating pain along the anterior-lateral aspect of her entire R leg to Great toe. No numbness/tingling.  Gabapentin does help.  She takes typically 600 mg at bedtime.  Dx imaging: 07/20/20 Pelvis & L-spine MRI             02/29/20 L-spine XR  Pertinent review of systems: No fevers or chills  Relevant historical information: Diabetes   Exam:  BP 122/80   Pulse 83   Ht 5\' 6"  (1.676 m)   Wt 203 lb (92.1 kg)   SpO2 97%   BMI 32.77 kg/m  General: Well Developed, well nourished, and in no acute distress.   MSK: L-spine: Normal appearing Nontender palpation midline.  Normal lumbar motion. Lower extremity strength is intact.      Assessment and Plan: 47 y.o. female with lumbar radiculopathy.  This is thought to be right L5 and secondary to the pars defects lumbar spine visible on her previous MRI.  She has had some epidural steroid injections with midline results in the past.  Right now she is pretty happy with the gabapentin.  Plan to refill gabapentin at current dose.  Consider a retrial of an epidural steroid injection or even an updated MRI if needed.   PDMP not reviewed this encounter. No orders of the defined types were placed in this encounter.  Meds ordered this encounter  Medications   gabapentin (NEURONTIN) 300 MG capsule    Sig: Take 2 capsules (600 mg total) by mouth at bedtime.    Dispense:  180 capsule    Refill:  3    OV due before next refill.     Discussed warning signs or symptoms. Please see  discharge instructions. Patient expresses understanding.   The above documentation has been reviewed and is accurate and complete Cheryl [name], M.D.

## 2023-05-12 NOTE — Patient Instructions (Signed)
Thank you for coming in today.   Continue gabapenitn as needed.   Work on Patent examiner. Swimming, Pilates could help.   I can repeat the MRI in the future if needed.

## 2023-05-13 DIAGNOSIS — M5416 Radiculopathy, lumbar region: Secondary | ICD-10-CM | POA: Insufficient documentation

## 2023-07-05 ENCOUNTER — Other Ambulatory Visit: Payer: Self-pay | Admitting: Family Medicine

## 2023-07-05 DIAGNOSIS — E1165 Type 2 diabetes mellitus with hyperglycemia: Secondary | ICD-10-CM

## 2023-07-15 ENCOUNTER — Other Ambulatory Visit: Payer: Self-pay | Admitting: Family Medicine

## 2023-10-19 ENCOUNTER — Other Ambulatory Visit: Payer: Self-pay | Admitting: Family Medicine

## 2023-10-19 DIAGNOSIS — E1165 Type 2 diabetes mellitus with hyperglycemia: Secondary | ICD-10-CM

## 2023-11-23 ENCOUNTER — Other Ambulatory Visit: Payer: Self-pay | Admitting: Family Medicine

## 2024-01-25 ENCOUNTER — Other Ambulatory Visit: Payer: Self-pay | Admitting: Family Medicine

## 2024-01-25 DIAGNOSIS — E1165 Type 2 diabetes mellitus with hyperglycemia: Secondary | ICD-10-CM

## 2024-02-08 ENCOUNTER — Other Ambulatory Visit: Payer: Self-pay | Admitting: Family Medicine

## 2024-02-08 ENCOUNTER — Encounter: Payer: Self-pay | Admitting: Family Medicine

## 2024-02-28 ENCOUNTER — Other Ambulatory Visit: Payer: Self-pay | Admitting: Family Medicine

## 2024-02-28 DIAGNOSIS — E1169 Type 2 diabetes mellitus with other specified complication: Secondary | ICD-10-CM

## 2024-03-28 LAB — HM MAMMOGRAPHY

## 2024-04-12 ENCOUNTER — Telehealth: Payer: Self-pay

## 2024-04-12 NOTE — Telephone Encounter (Signed)
 Called patient a few times to clarify what is her request for. She has not been seen by us  since April of 2024.

## 2024-04-12 NOTE — Telephone Encounter (Signed)
 Copied from CRM [id number]. Topic: General - Other >> Apr 12, 2024 10:48 AM Alfonso HERO wrote: Reason for CRM: patient is calling because her results states she needs more testing and wanted to know if the new order can be sent to [facility] imaging. Can someone please call the patient when the order is complete.

## 2024-04-17 ENCOUNTER — Encounter: Payer: Self-pay | Admitting: Family Medicine

## 2024-04-17 ENCOUNTER — Ambulatory Visit: Admitting: Family Medicine

## 2024-04-17 ENCOUNTER — Ambulatory Visit: Payer: Self-pay

## 2024-04-17 ENCOUNTER — Ambulatory Visit (HOSPITAL_BASED_OUTPATIENT_CLINIC_OR_DEPARTMENT_OTHER)
Admission: RE | Admit: 2024-04-17 | Discharge: 2024-04-17 | Disposition: A | Discharge status: Home / Self Care | Source: Ambulatory Visit | Attending: Family Medicine | Admitting: Family Medicine

## 2024-04-17 VITALS — BP 98/80 | HR 86 | Temp 98.4°F | Resp 18 | Ht 66.0 in | Wt 205.0 lb

## 2024-04-17 DIAGNOSIS — N6311 Unspecified lump in the right breast, upper outer quadrant: Secondary | ICD-10-CM | POA: Diagnosis not present

## 2024-04-17 DIAGNOSIS — L089 Local infection of the skin and subcutaneous tissue, unspecified: Secondary | ICD-10-CM

## 2024-04-17 DIAGNOSIS — M79672 Pain in left foot: Secondary | ICD-10-CM

## 2024-04-17 DIAGNOSIS — S80811A Abrasion, right lower leg, initial encounter: Secondary | ICD-10-CM | POA: Diagnosis not present

## 2024-04-17 MED ORDER — DOXYCYCLINE HYCLATE 100 MG PO TABS: 100.0000 mg | ORAL_TABLET | Freq: Two times a day (BID) | ORAL | 0 refills | Status: DC

## 2024-04-17 NOTE — Telephone Encounter (Signed)
 Pt scheduled for this afternoon.

## 2024-04-17 NOTE — Telephone Encounter (Addendum)
 FYI Only or Action Required?: FYI only for provider.  Patient was last seen in primary care on 10/18/2022 by Antonio Meth, Jamee SAUNDERS, DO.  Called Nurse Triage reporting Knee Injury.  Symptoms began several days ago.  Interventions attempted: OTC medications: Ibuprofen, Rest, hydration, or home remedies, and Ice/heat application.  Symptoms are: stable.  Triage Disposition: See Physician Within 24 Hours  Patient/caregiver understands and will follow disposition?: Yes Copied from CRM #[id number]. Topic: Clinical - Red Word Triage >> Apr 17, 2024  9:10 AM Mia F wrote: Red Word that prompted transfer to Nurse Triage: Clemens over the weekend and has a cut on her knee. She says it is still bleeding and she fell on Saturday. It is very red and slight swelling. Clear but slightly yellow fluid coming from it. Reason for Disposition  [1] No prior tetanus shots (or is not fully vaccinated) AND [2] any wound (e.g., cut, scrape)  Answer Assessment - Initial Assessment Questions 1. MECHANISM: How did the injury happen? (e.g., twisting injury, direct blow)      Twisted left ankle and fell on right knee  2. ONSET: When did the injury happen? (e.g., minutes, hours ago)      3 days ago  3. LOCATION: Where is the injury located?      Right knee and left ankle  4. APPEARANCE of INJURY: What does the injury look like?      Red and slight swelling,  5. SEVERITY: Can you put weight on that leg? Can you walk?      Yes  6. SIZE: For cuts, bruises, or swelling, ask: How large is it? (e.g., inches or centimeters;  entire joint)      Has a cut on knee, unable to see how deep or long it is  7. PAIN: Is there pain? If Yes, ask: How bad is the pain?   What does it keep you from doing? (Scale 0-10; or none, mild, moderate, severe)     5/10- controlled with Ibuprofen  8. TETANUS: For any breaks in the skin, ask: When was your last tetanus booster?     Unsure of when last tetanus  9. OTHER  SYMPTOMS: Do you have any other symptoms?  (e.g., pop when knee injured, swelling, locking, buckling)      Left ankle pain with some bruising   10. PREGNANCY: Is there any chance you are pregnant? When was your last menstrual period?       no  Protocols used: Knee Injury-A-AH

## 2024-04-17 NOTE — Progress Notes (Signed)
 Subjective:    Patient ID: Cheryl [name], female    DOB: 03/06/1976, 48 y.o.   MRN: [id number]  Chief Complaint  Patient presents with   Laceration    Pt states she tripped on a root and fell to the ground, right knee and calf.     HPI Patient is in today for f/u fall and skin tear.  Discussed the use of AI scribe software for clinical note transcription with the patient, who gave verbal consent to proceed.  History of Present Illness Cheryl [name] is a 48 year old female who presents with a left foot injury sustained from a fall.  She twisted her left foot on a root and fell into the dirt on Saturday, resulting in a wound on her foot. She is concerned about whether the wound requires stitches and has been applying an antiseptic and antibiotic ointment. She is uncertain about the adequacy of cleaning but notes that the wound has started to heal and has been wearing a bandage over it.  She experiences pain on the side and underneath her left foot, which worsens with walking, causing her to limp. The pain has not improved since the injury occurred, and there is no bruising present. She is able to walk but with discomfort.  In a separate concern, she recently underwent a mammogram which indicated the need for a follow-up scan due to findings in the upper outer quadrant of the right breast. Mult masses seen R breast .      Past Medical History:  Diagnosis Date   Anxiety    Bipolar 1 disorder (HCC)    Bipolar 2 disorder (HCC)    Depression    Diabetes mellitus without complication (HCC)    Kidney stone    Migraines    with aura   OSA (obstructive sleep apnea) 10/17/2013   Thyroid  disease     Past Surgical History:  Procedure Laterality Date   INTRAUTERINE DEVICE (IUD) INSERTION  10/2011   inserted & removed 2018   NASAL SEPTUM SURGERY     OTHER SURGICAL HISTORY     endometrial polyps (2)   WISDOM TOOTH EXTRACTION      Family History  Problem Relation Age of  Onset   Cancer Mother 20       ovarian   Hypothyroidism Mother    Stroke Mother 62   Hypertension Father    Diabetes Father    Hyperlipidemia Father    Heart disease Father        cabg--stint   Migraines Father    Stroke Father    Hypothyroidism Sister    Depression Sister    COPD Maternal Grandmother    Cancer Maternal Grandmother        lung   Hypertension Maternal Grandmother    Depression Maternal Grandmother    Stroke Maternal Grandmother    Cancer Maternal Grandfather        prostate, lung   Diabetes Paternal Grandmother    Stroke Paternal Grandmother    Alcohol abuse Paternal Grandfather    Aneurysm Paternal Grandfather    Cancer Maternal Aunt        breast    Cancer Maternal Uncle        throat   Depression Cousin        bipolar   Stomach cancer Neg Hx    Esophageal cancer Neg Hx     Social History   Socioeconomic History   Marital status: Married  Spouse name: Not on file   Number of children: 0   Years of education: Not on file   Highest education level: Not on file  Occupational History   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, Advertising: [employer]   Occupation: psychologist, counselling  Tobacco Use   Smoking status: Every Day    Current packs/day: 1.00    Average packs/day: 1 pack/day for 20.0 years (20.0 ttl pk-yrs)    Types: Cigarettes   Smokeless tobacco: Never  Vaping Use   Vaping status: Never Used  Substance and Sexual Activity   Alcohol use: No    Alcohol/week: 0.0 standard drinks of alcohol   Drug use: No   Sexual activity: Yes    Partners: Male    Birth control/protection: None  Other Topics Concern   Not on file  Social History Narrative   Exercise-- no   Social Drivers of Corporate Investment Banker Strain: Not on file  Food Insecurity: Not on file  Transportation Needs: Not on file  Physical Activity: Not on file  Stress: Not on file  Social Connections: Unknown (10/27/2021)   [facility]   Social  Network    Social Network: Not on file  Intimate Partner Violence: Unknown (09/23/2021)   Received from [facility]   HITS    Physically Hurt: Not on file    Insult or Talk Down To: Not on file    Threaten Physical Harm: Not on file    Scream or Curse: Not on file    Outpatient Medications Prior to Visit  Medication Sig Dispense Refill   cariprazine (VRAYLAR) 1.5 MG capsule Take by mouth.     fenofibrate  160 MG tablet Take 1 tablet (160 mg total) by mouth daily. 90 tablet 0   gabapentin  (NEURONTIN ) 300 MG capsule Take 2 capsules (600 mg total) by mouth at bedtime. 180 capsule 3   glucose blood test strip Use to check blood sugar once daily 100 each 12   lamoTRIgine  (LAMICTAL ) 150 MG tablet Take 1 tablet (150 mg total) by mouth 2 (two) times daily. 60 tablet 2   levothyroxine  (SYNTHROID ) 200 MCG tablet Take 1 tablet (200 mcg total) by mouth daily before breakfast. 90 tablet 0   metFORMIN  (GLUCOPHAGE -XR) 500 MG 24 hr tablet Take 1 tablet (500 mg total) by mouth 2 (two) times daily with a meal. NEEDS APPT 60 tablet 0   ONETOUCH DELICA LANCETS FINE MISC As directed qd 100 each 1   pantoprazole  (PROTONIX ) 40 MG tablet Take 1 tablet (40 mg total) by mouth 2 (two) times daily before a meal. 60 tablet 0   rizatriptan  (MAXALT -MLT) 10 MG disintegrating tablet DISSOLVE ONE TABLET IN THE MOUTH DAILY AS NEEDED FOR MIGRAINE. MAY REPEAT IN 2 HOURS IF NEEDED. 10 tablet 0   rosuvastatin  (CRESTOR ) 10 MG tablet Take 1 tablet (10 mg total) by mouth daily. 90 tablet 0   sertraline  (ZOLOFT ) 100 MG tablet Take 2 tablets (200 mg total) by mouth daily. 60 tablet 2   No facility-administered medications prior to visit.    Allergies  Allergen Reactions   Augmentin  [Amoxicillin -Pot Clavulanate] Diarrhea   Food Itching    Bison-throat tingling    Review of Systems  Constitutional:  Negative for fever and malaise/fatigue.  HENT:  Negative for congestion.   Eyes:  Negative for blurred vision.  Respiratory:   Negative for shortness of breath.   Cardiovascular:  Negative for chest pain, palpitations and leg swelling.  Gastrointestinal:  Negative for abdominal pain, blood in stool and nausea.  Genitourinary:  Negative for dysuria and frequency.  Musculoskeletal:  Negative for falls.  Skin:  Negative for rash.  Neurological:  Negative for dizziness, loss of consciousness and headaches.  Endo/Heme/Allergies:  Negative for environmental allergies.  Psychiatric/Behavioral:  Negative for depression. The patient is not nervous/anxious.        Objective:    Physical Exam Vitals and nursing note reviewed.  Constitutional:      General: She is not in acute distress.    Appearance: Normal appearance. She is well-developed.  HENT:     Head: Normocephalic and atraumatic.  Eyes:     General: No scleral icterus.       Right eye: No discharge.        Left eye: No discharge.  Cardiovascular:     Rate and Rhythm: Normal rate and regular rhythm.     Heart sounds: No murmur heard. Pulmonary:     Effort: Pulmonary effort is normal. No respiratory distress.     Breath sounds: Normal breath sounds.  Musculoskeletal:        General: Normal range of motion.     Cervical back: Normal range of motion and neck supple.     Right lower leg: No edema.     Left lower leg: No edema.       Legs:     Comments: Pain L foot laterally , no swelling pain with weight bearing only  Skin:    General: Skin is warm and dry.     Findings: Abrasion and wound present.         Comments: Skin tear just below knee R leg Area cleaned well abx oint and band aid applied   Neurological:     Mental Status: She is alert and oriented to person, place, and time.  Psychiatric:        Mood and Affect: Mood normal.        Behavior: Behavior normal.        Thought Content: Thought content normal.        Judgment: Judgment normal.     BP 98/80 (BP Location: Right Arm, Patient Position: Sitting, Cuff Size: Large)   Pulse 86    Temp 98.4 F (36.9 C) (Oral)   Resp 18   Ht 5' 6 (1.676 m)   Wt 205 lb (93 kg)   LMP 04/03/2024   SpO2 96%   BMI 33.09 kg/m  Wt Readings from Last 3 Encounters:  04/17/24 205 lb (93 kg)  05/12/23 203 lb (92.1 kg)  10/18/22 210 lb 12.8 oz (95.6 kg)    Diabetic Foot Exam - Simple   No data filed    Lab Results  Component Value Date   WBC 11.0 (H) 10/18/2022   HGB 13.6 10/18/2022   HCT 41.1 10/18/2022   PLT 283.0 10/18/2022   GLUCOSE 175 (H) 10/18/2022   CHOL 194 10/18/2022   TRIG (H) 10/18/2022    447.0 Triglyceride is over 400; calculations on Lipids are invalid.   HDL 43.60 10/18/2022   LDLDIRECT 110.0 10/18/2022   LDLCALC 115 (H) 01/07/2017   ALT 35 10/18/2022   AST 20 10/18/2022   NA 136 10/18/2022   K 3.8 10/18/2022   CL 96 10/18/2022   CREATININE 0.71 10/18/2022   BUN 12 10/18/2022   CO2 30 10/18/2022   TSH 0.03 (L) 10/18/2022   HGBA1C 7.8 (H) 10/18/2022    Lab Results  Component  Value Date   TSH 0.03 (L) 10/18/2022   Lab Results  Component Value Date   WBC 11.0 (H) 10/18/2022   HGB 13.6 10/18/2022   HCT 41.1 10/18/2022   MCV 88.8 10/18/2022   PLT 283.0 10/18/2022   Lab Results  Component Value Date   NA 136 10/18/2022   K 3.8 10/18/2022   CO2 30 10/18/2022   GLUCOSE 175 (H) 10/18/2022   BUN 12 10/18/2022   CREATININE 0.71 10/18/2022   BILITOT 0.2 10/18/2022   ALKPHOS 71 10/18/2022   AST 20 10/18/2022   ALT 35 10/18/2022   PROT 7.2 10/18/2022   ALBUMIN 4.4 10/18/2022   CALCIUM  9.5 10/18/2022   ANIONGAP 8 11/21/2020   GFR 101.64 10/18/2022   Lab Results  Component Value Date   CHOL 194 10/18/2022   Lab Results  Component Value Date   HDL 43.60 10/18/2022   Lab Results  Component Value Date   LDLCALC 115 (H) 01/07/2017   Lab Results  Component Value Date   TRIG (H) 10/18/2022    447.0 Triglyceride is over 400; calculations on Lipids are invalid.   Lab Results  Component Value Date   CHOLHDL 4 10/18/2022   Lab Results   Component Value Date   HGBA1C 7.8 (H) 10/18/2022       Assessment & Plan:  Abrasion, leg w/ infection, right, initial encounter -     Doxycycline Hyclate; Take 1 tablet (100 mg total) by mouth 2 (two) times daily.  Dispense: 20 tablet; Refill: 0  Left foot pain -     DG Foot Complete Left; Future  Mass of upper outer quadrant of right breast -     MM 3D DIAGNOSTIC MAMMOGRAM BILATERAL BREAST; Future -     US  BREAST COMPLETE UNI RIGHT INC AXILLA; Future   Assessment and Plan Assessment & Plan Left foot laceration and pain   A fall on Saturday resulted in a laceration on her left foot. The wound cannot be sutured due to the time elapsed. Pain is localized to the side of the foot, with a potential fracture, though she ambulates with a limp. No bruising is present. Clean the wound thoroughly, apply antibiotic ointment, and wrap it. Monitor for signs of infection such as green discoloration.  Wound infection prophylaxis for left foot laceration   Due to exposure to dirt, prophylactic antibiotic treatment is necessary to reduce infection risk. Her tetanus vaccination is current as of 2023. Prescribe doxycycline for infection prophylaxis.  Right breast mass   A recent mammogram indicated multiple masses in the upper outer quadrant of the right breast, requiring further evaluation. She prefers local follow-up imaging. Order a diagnostic mammogram and possible ultrasound at the local breast center. Instruct her to obtain mammogram films on disc from the initial facility and bring them to the breast center. Advise her to contact the breast center to schedule the diagnostic imaging and inquire if the disc is needed beforehand.   Aadam Zhen R Lowne Chase, DO

## 2024-04-18 ENCOUNTER — Telehealth: Payer: Self-pay

## 2024-04-18 NOTE — Telephone Encounter (Signed)
 Copied from CRM [id number]. Topic: Referral - Prior Authorization Question >> Apr 18, 2024  8:36 AM Laymon HERO wrote: Reason for CRM:  [employer] [phone]- option 1 and then 2- Referral for diagnostic mammogram- needing prior imaging

## 2024-04-18 NOTE — Telephone Encounter (Signed)
 Noted! Thank you

## 2024-04-18 NOTE — Telephone Encounter (Signed)
 Orders are in for diagnostic mammogram and ultrasound. Typically a pre-cert isn't needed for either of these. Can you check please?

## 2024-04-18 NOTE — Telephone Encounter (Signed)
 [name], Cheryl M, NT to Me  (Selected Message)     04/18/24  1:20 PM No auths needed for either one, not sure why they called. I'll give them a call to verify,

## 2024-04-19 ENCOUNTER — Ambulatory Visit: Payer: Self-pay | Admitting: Family Medicine

## 2024-04-26 ENCOUNTER — Telehealth: Payer: Self-pay

## 2024-04-26 NOTE — Telephone Encounter (Signed)
 Copied from CRM [id number]. Topic: Clinical - Request for Lab/Test Order >> Apr 26, 2024  1:20 PM Ahlexyia S wrote: Reason for CRM: Ceanea from [employer] calling to request a order for a right diagnostic mammogram and right diagnostic breast ultrasound. Ceanea stated that she can be contacted if need be.  E[employer]ist [phone]

## 2024-04-27 ENCOUNTER — Other Ambulatory Visit: Payer: Self-pay | Admitting: Family Medicine

## 2024-04-27 DIAGNOSIS — R928 Other abnormal and inconclusive findings on diagnostic imaging of breast: Secondary | ICD-10-CM

## 2024-04-27 NOTE — Telephone Encounter (Signed)
 Pt had abnormal screening mammogram on 03/28/24 (see care everywhere)  Imaging Results - (ABNORMAL) MG Breast Screening Tomo Bilat (03/28/2024 9:54 AM EDT) Impressions  04/09/2024 10:30 AM EDT  CONCLUSION: Breast composition: The breasts are heterogeneously dense, which may obscure small masses.  BI-RADS Category: 0 - Incomplete; needs additional imaging evaluation.  RECOMMENDATIONS: Additional Evaluation  The patient should be recalled for a diagnostic right mammogram and possible right  breast ultrasound.  These additional studies may be scheduled at either location: 1) For [facility], please place an order through [place] or our Centralized Scheduling Group at [phone], or FAX [phone].  2) For [facility], please call [phone].  3) For [facility] women's imaging [place], please call [phone] or fax [phone].  [place]  law now requires that mammography facilities inform patients of their breast density and that higher density may increase their risk of cancer and may decrease sensitivity of mammography. Also, if heterogeneously dense or extremely dense, they should talk with their doctor about alternative screening strategies. The following website has been developed to assist patients and physicians: [email or web address].org/[email or web address]-health.[email or web address]. The patient lay summary letter includes their breast density, a link to the density website, and for patients with dense breasts, a recommendation to talk with their doctor.     Imaging Results - (ABNORMAL) MG Breast Screening Tomo Bilat (03/28/2024 9:54 AM EDT) Narrative  04/09/2024 10:30 AM EDT   BILATERAL SCREENING MAMMOGRAM, 03/28/2024 9:54 AM  INDICATION: Encounter for other screening for malignant neoplasm of breast \ Z12.39 Encounter for other screening for malignant neoplasm of breast  TECHNIQUE: CC and MLO views were performed of each  breast with digital technique and computer-aided detection. The examination was supplemented with 3D tomosynthesis.  COMPARISON: Multiple priors.  FINDINGS:  Possible distortion in upper outer quadrant of right breast at middle depth. Multiple circumscribed isodense masses scattered in the right breast. No suspicious findings in the left breast.

## 2024-04-27 NOTE — Telephone Encounter (Signed)
Orders faxed to [facility]

## 2024-05-01 ENCOUNTER — Other Ambulatory Visit: Payer: Self-pay | Admitting: Medical Genetics

## 2024-05-04 ENCOUNTER — Other Ambulatory Visit: Payer: Self-pay | Admitting: Family Medicine

## 2024-05-04 DIAGNOSIS — E1165 Type 2 diabetes mellitus with hyperglycemia: Secondary | ICD-10-CM

## 2024-05-08 ENCOUNTER — Ambulatory Visit
Admission: RE | Admit: 2024-05-08 | Discharge: 2024-05-08 | Disposition: A | Discharge status: Home / Self Care | Source: Ambulatory Visit | Attending: Family Medicine | Admitting: Family Medicine

## 2024-05-08 ENCOUNTER — Other Ambulatory Visit: Payer: Self-pay | Admitting: Family Medicine

## 2024-05-08 DIAGNOSIS — N6311 Unspecified lump in the right breast, upper outer quadrant: Secondary | ICD-10-CM

## 2024-05-08 DIAGNOSIS — M79672 Pain in left foot: Secondary | ICD-10-CM

## 2024-05-08 DIAGNOSIS — L089 Local infection of the skin and subcutaneous tissue, unspecified: Secondary | ICD-10-CM

## 2024-05-10 ENCOUNTER — Encounter: Admitting: Family Medicine

## 2024-05-31 ENCOUNTER — Other Ambulatory Visit: Payer: Self-pay | Admitting: Family Medicine

## 2024-06-01 ENCOUNTER — Encounter: Payer: Self-pay | Admitting: Family Medicine

## 2024-06-01 ENCOUNTER — Ambulatory Visit: Admitting: Family Medicine

## 2024-06-01 VITALS — BP 106/70 | HR 85 | Temp 98.5°F | Resp 16 | Ht 66.0 in | Wt 206.2 lb

## 2024-06-01 DIAGNOSIS — E039 Hypothyroidism, unspecified: Secondary | ICD-10-CM

## 2024-06-01 DIAGNOSIS — E1169 Type 2 diabetes mellitus with other specified complication: Secondary | ICD-10-CM

## 2024-06-01 DIAGNOSIS — Z0000 Encounter for general adult medical examination without abnormal findings: Secondary | ICD-10-CM

## 2024-06-01 DIAGNOSIS — Z1211 Encounter for screening for malignant neoplasm of colon: Secondary | ICD-10-CM

## 2024-06-01 DIAGNOSIS — E1165 Type 2 diabetes mellitus with hyperglycemia: Secondary | ICD-10-CM

## 2024-06-01 MED ORDER — METFORMIN HCL ER 500 MG PO TB24: 1000.0000 mg | ORAL_TABLET | Freq: Every evening | ORAL | 0 refills | Status: AC

## 2024-06-01 MED ORDER — TIRZEPATIDE 2.5 MG/0.5ML ~~LOC~~ SOAJ: 2.5000 mg | SUBCUTANEOUS | 0 refills | Status: DC

## 2024-06-01 MED ORDER — LEVOTHYROXINE SODIUM 200 MCG PO TABS: 200.0000 ug | ORAL_TABLET | Freq: Every day | ORAL | 0 refills | Status: AC

## 2024-06-01 MED ORDER — ROSUVASTATIN CALCIUM 10 MG PO TABS: 10.0000 mg | ORAL_TABLET | Freq: Every day | ORAL | 0 refills | Status: DC

## 2024-06-01 MED ORDER — FENOFIBRATE 160 MG PO TABS: 160.0000 mg | ORAL_TABLET | Freq: Every day | ORAL | 0 refills | Status: AC

## 2024-06-01 NOTE — Progress Notes (Signed)
 Subjective:    Patient ID: Cheryl [name], female    DOB: Aug 04, 1975, 48 y.o.   MRN: [id number]  Chief Complaint  Patient presents with   Annual Exam    Pt states not fasting     HPI Patient is in today for cpe Discussed the use of AI scribe software for clinical note transcription with the patient, who gave verbal consent to proceed.  History of Present Illness Cheryl [name] is a 48 year old female who presents for an annual physical exam.  She has diabetes with blood sugar levels generally in the 200s, despite dietary efforts. She is currently taking metformin , with a three-month supply but no refills remaining. No symptoms such as constant thirst, tingling, or numbness in her feet. She drinks a lot of water and urinates frequently.  She has a history of a miscarriage and expresses discomfort with visiting a fertility clinic due to the presence of baby pictures, which she finds distressing. She is not currently using any form of contraception as her IUD has been removed.  Her father has been diagnosed with dementia, which she recently recalled and mentioned during the visit.  She has not had a colonoscopy yet. She has not had an eye exam recently but has an eye doctor. She received flu, COVID, and pneumonia vaccines earlier this year at a work clinic.  She is currently employed at [employer] and lives paycheck to paycheck, which impacts her ability to consider adoption.    Past Medical History:  Diagnosis Date   Anxiety    Bipolar 1 disorder (HCC)    Bipolar 2 disorder (HCC)    Depression    Diabetes mellitus without complication (HCC)    Kidney stone    Migraines    with aura   OSA (obstructive sleep apnea) 10/17/2013   Thyroid  disease     Past Surgical History:  Procedure Laterality Date   INTRAUTERINE DEVICE (IUD) INSERTION  10/2011   inserted & removed 2018   NASAL SEPTUM SURGERY     OTHER SURGICAL HISTORY     endometrial polyps (2)    WISDOM TOOTH EXTRACTION      Family History  Problem Relation Age of Onset   Cancer Mother 72       ovarian   Hypothyroidism Mother    Stroke Mother 23   Hypertension Father    Diabetes Father    Hyperlipidemia Father    Heart disease Father        cabg--stint   Migraines Father    Stroke Father    Hypothyroidism Sister    Depression Sister    COPD Maternal Grandmother    Cancer Maternal Grandmother        lung   Hypertension Maternal Grandmother    Depression Maternal Grandmother    Stroke Maternal Grandmother    Cancer Maternal Grandfather        prostate, lung   Diabetes Paternal Grandmother    Stroke Paternal Grandmother    Alcohol abuse Paternal Grandfather    Aneurysm Paternal Grandfather    Cancer Maternal Aunt        breast    Cancer Maternal Uncle        throat   Depression Cousin        bipolar   Stomach cancer Neg Hx    Esophageal cancer Neg Hx     Social History   Socioeconomic History   Marital status: Married    Spouse name: Not  on file   Number of children: 0   Years of education: Not on file   Highest education level: Not on file  Occupational History   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, [employer]: [employer]   Occupation: psychologist, counselling  Tobacco Use   Smoking status: Every Day    Current packs/day: 1.00    Average packs/day: 1 pack/day for 20.0 years (20.0 ttl pk-yrs)    Types: Cigarettes   Smokeless tobacco: Never  Vaping Use   Vaping status: Never Used  Substance and Sexual Activity   Alcohol use: No    Alcohol/week: 0.0 standard drinks of alcohol   Drug use: No   Sexual activity: Yes    Partners: Male    Birth control/protection: None  Other Topics Concern   Not on file  Social History Narrative   Exercise-- no   Social Drivers of Health   Tobacco Use: High Risk (06/01/2024)   Patient History    Smoking Tobacco Use: Every Day    Smokeless Tobacco Use: Never    Passive Exposure: Not on file  Financial  Resource Strain: Not on file  Food Insecurity: Not on file  Transportation Needs: Not on file  Physical Activity: Not on file  Stress: Not on file  Social Connections: Unknown (10/27/2021)   Received from [facility]   Social Network    Social Network: Not on file  Intimate Partner Violence: Unknown (09/23/2021)   Received from [facility]   HITS    Physically Hurt: Not on file    Insult or Talk Down To: Not on file    Threaten Physical Harm: Not on file    Scream or Curse: Not on file  Depression (PHQ2-9): Low Risk (04/17/2024)   Depression (PHQ2-9)    PHQ-2 Score: 0  Alcohol Screen: Not on file  Housing: Not on file  Utilities: Not on file  Health Literacy: Not on file    Outpatient Medications Prior to Visit  Medication Sig Dispense Refill   cariprazine (VRAYLAR) 1.5 MG capsule Take by mouth.     gabapentin  (NEURONTIN ) 300 MG capsule Take 2 capsules (600 mg total) by mouth at bedtime. 180 capsule 3   glucose blood test strip Use to check blood sugar once daily 100 each 12   hydrOXYzine (ATARAX) 10 MG tablet Take 10 mg by mouth 2 (two) times daily.     lamoTRIgine  (LAMICTAL ) 150 MG tablet Take 1 tablet (150 mg total) by mouth 2 (two) times daily. 60 tablet 2   ONETOUCH DELICA LANCETS FINE MISC As directed qd 100 each 1   sertraline  (ZOLOFT ) 100 MG tablet Take 2 tablets (200 mg total) by mouth daily. 60 tablet 2   fenofibrate  160 MG tablet Take 1 tablet (160 mg total) by mouth daily. 90 tablet 0   levothyroxine  (SYNTHROID ) 200 MCG tablet Take 1 tablet (200 mcg total) by mouth daily before breakfast. 30 tablet 0   metFORMIN  (GLUCOPHAGE -XR) 500 MG 24 hr tablet Take 2 tablets (1,000 mg total) by mouth every evening. 180 tablet 0   pantoprazole  (PROTONIX ) 40 MG tablet Take 1 tablet (40 mg total) by mouth 2 (two) times daily before a meal. 60 tablet 0   rizatriptan  (MAXALT -MLT) 10 MG disintegrating tablet DISSOLVE ONE TABLET IN THE MOUTH DAILY AS NEEDED FOR MIGRAINE. MAY REPEAT IN 2  HOURS IF NEEDED. 10 tablet 0   rosuvastatin  (CRESTOR ) 10 MG tablet Take 1 tablet (10 mg total) by mouth daily. 90 tablet  0   doxycycline  (VIBRA -TABS) 100 MG tablet Take 1 tablet (100 mg total) by mouth 2 (two) times daily. 20 tablet 0   No facility-administered medications prior to visit.    Allergies[1]  Review of Systems  Constitutional:  Negative for fever and malaise/fatigue.  HENT:  Negative for congestion.   Eyes:  Negative for blurred vision.  Respiratory:  Negative for shortness of breath.   Cardiovascular:  Negative for chest pain, palpitations and leg swelling.  Gastrointestinal:  Negative for abdominal pain, blood in stool and nausea.  Genitourinary:  Negative for dysuria and frequency.  Musculoskeletal:  Negative for falls.  Skin:  Negative for rash.  Neurological:  Negative for dizziness, loss of consciousness and headaches.  Endo/Heme/Allergies:  Negative for environmental allergies.  Psychiatric/Behavioral:  Negative for depression. The patient is not nervous/anxious.        Objective:    Physical Exam Vitals and nursing note reviewed.  Constitutional:      General: She is not in acute distress.    Appearance: Normal appearance. She is well-developed.  HENT:     Head: Normocephalic and atraumatic.  Eyes:     General: No scleral icterus.       Right eye: No discharge.        Left eye: No discharge.  Cardiovascular:     Rate and Rhythm: Normal rate and regular rhythm.     Heart sounds: No murmur heard. Pulmonary:     Effort: Pulmonary effort is normal. No respiratory distress.     Breath sounds: Normal breath sounds.  Musculoskeletal:        General: Normal range of motion.     Cervical back: Normal range of motion and neck supple.     Right lower leg: No edema.     Left lower leg: No edema.  Skin:    General: Skin is warm and dry.  Neurological:     Mental Status: She is alert and oriented to person, place, and time.  Psychiatric:        Mood and  Affect: Mood normal.        Behavior: Behavior normal.        Thought Content: Thought content normal.        Judgment: Judgment normal.     BP 106/70 (BP Location: Left Arm, Patient Position: Sitting, Cuff Size: Normal)   Pulse 85   Temp 98.5 F (36.9 C) (Oral)   Resp 16   Ht 5' 6 (1.676 m)   Wt 206 lb 3.2 oz (93.5 kg)   LMP 05/31/2024   SpO2 97%   Breastfeeding No   BMI 33.28 kg/m  Wt Readings from Last 3 Encounters:  06/01/24 206 lb 3.2 oz (93.5 kg)  04/17/24 205 lb (93 kg)  05/12/23 203 lb (92.1 kg)    Diabetic Foot Exam - Simple   No data filed    Lab Results  Component Value Date   WBC 8.0 06/01/2024   HGB 12.6 06/01/2024   HCT 38.5 06/01/2024   PLT 279 06/01/2024   GLUCOSE 222 (H) 06/01/2024   CHOL 161 06/01/2024   TRIG 285 (H) 06/01/2024   HDL 44 (L) 06/01/2024   LDLDIRECT 110.0 10/18/2022   LDLCALC 80 06/01/2024   ALT 97 (H) 06/01/2024   AST 60 (H) 06/01/2024   NA 136 06/01/2024   K 4.0 06/01/2024   CL 98 06/01/2024   CREATININE 0.82 06/01/2024   BUN 17 06/01/2024   CO2 29 06/01/2024  TSH 0.03 (L) 10/18/2022   HGBA1C 8.9 (H) 06/01/2024   MICROALBUR <0.2 06/01/2024    Lab Results  Component Value Date   TSH 0.03 (L) 10/18/2022   Lab Results  Component Value Date   WBC 8.0 06/01/2024   HGB 12.6 06/01/2024   HCT 38.5 06/01/2024   MCV 91.4 06/01/2024   PLT 279 06/01/2024   Lab Results  Component Value Date   NA 136 06/01/2024   K 4.0 06/01/2024   CO2 29 06/01/2024   GLUCOSE 222 (H) 06/01/2024   BUN 17 06/01/2024   CREATININE 0.82 06/01/2024   BILITOT 0.3 06/01/2024   ALKPHOS 71 10/18/2022   AST 60 (H) 06/01/2024   ALT 97 (H) 06/01/2024   PROT 7.2 06/01/2024   ALBUMIN 4.4 10/18/2022   CALCIUM  9.9 06/01/2024   ANIONGAP 8 11/21/2020   EGFR 88 06/01/2024   GFR 101.64 10/18/2022   Lab Results  Component Value Date   CHOL 161 06/01/2024   Lab Results  Component Value Date   HDL 44 (L) 06/01/2024   Lab Results  Component  Value Date   LDLCALC 80 06/01/2024   Lab Results  Component Value Date   TRIG 285 (H) 06/01/2024   Lab Results  Component Value Date   CHOLHDL 3.7 06/01/2024   Lab Results  Component Value Date   HGBA1C 8.9 (H) 06/01/2024       Assessment & Plan:  Preventative health care -     CBC with Differential/Platelet -     Comprehensive metabolic panel with GFR -     Hemoglobin A1c -     Lipid panel -     Microalbumin / creatinine urine ratio  Uncontrolled type 2 diabetes mellitus with hyperglycemia (HCC) -     metFORMIN  HCl ER; Take 2 tablets (1,000 mg total) by mouth every evening.  Dispense: 180 tablet; Refill: 0 -     Tirzepatide ; Inject 2.5 mg into the skin once a week.  Dispense: 2 mL; Refill: 0 -     CBC with Differential/Platelet -     Hemoglobin A1c -     Microalbumin / creatinine urine ratio  Hyperlipidemia associated with type 2 diabetes mellitus (HCC) -     Fenofibrate ; Take 1 tablet (160 mg total) by mouth daily.  Dispense: 90 tablet; Refill: 0 -     Rosuvastatin  Calcium ; Take 1 tablet (10 mg total) by mouth daily.  Dispense: 90 tablet; Refill: 0 -     Comprehensive metabolic panel with GFR -     Hemoglobin A1c -     Lipid panel -     Microalbumin / creatinine urine ratio  Hypothyroidism, unspecified type -     Levothyroxine  Sodium; Take 1 tablet (200 mcg total) by mouth daily before breakfast.  Dispense: 30 tablet; Refill: 0  Colon cancer screening -     Ambulatory referral to Gastroenterology  Assessment and Plan Assessment & Plan Uncontrolled type 2 diabetes mellitus   Blood glucose levels remain in the 200s despite dietary efforts. She has no symptoms of neuropathy or polyuria. Mounjaro  was discussed for glycemic control and weight loss, emphasizing the importance of protein intake to prevent hair loss and skin changes. Potential side effects, including nausea and abdominal pain, were reviewed, with advice to discontinue if abdominal pain occurs. Start  Mounjaro  at the lowest dose with weekly injections. Increase the dose to 5 mg after one month if tolerated. Monitor for side effects such as nausea and abdominal pain.  Ensure adequate protein intake.  General health maintenance   Discussed the need for colonoscopy screening, eye exam, and dental visit. She has received pneumonia, flu, and COVID vaccinations. Schedule a colonoscopy with a GI specialist, an eye exam with an ophthalmologist, and a dental visit. Obtain vaccination records from work for documentation.    Montarius Kitagawa R Lowne Chase, DO     [1]  Allergies Allergen Reactions   Augmentin  [Amoxicillin -Pot Clavulanate] Diarrhea   Food Itching    Bison-throat tingling

## 2024-06-02 LAB — CBC WITH DIFFERENTIAL/PLATELET
Absolute Lymphocytes: 3392 {cells}/uL (ref 850–3900)
Absolute Monocytes: 456 {cells}/uL (ref 200–950)
Basophils Absolute: 64 {cells}/uL (ref 0–200)
Basophils Relative: 0.8 %
Eosinophils Absolute: 80 {cells}/uL (ref 15–500)
Eosinophils Relative: 1 %
HCT: 38.5 % (ref 35.9–46.0)
Hemoglobin: 12.6 g/dL (ref 11.7–15.5)
MCH: 29.9 pg (ref 27.0–33.0)
MCHC: 32.7 g/dL (ref 31.6–35.4)
MCV: 91.4 fL (ref 81.4–101.7)
MPV: 10 fL (ref 7.5–12.5)
Monocytes Relative: 5.7 %
Neutro Abs: 4008 {cells}/uL (ref 1500–7800)
Neutrophils Relative %: 50.1 %
Platelets: 279 Thousand/uL (ref 140–400)
RBC: 4.21 Million/uL (ref 3.80–5.10)
RDW: 11.7 % (ref 11.0–15.0)
Total Lymphocyte: 42.4 %
WBC: 8 Thousand/uL (ref 3.8–10.8)

## 2024-06-02 LAB — HEMOGLOBIN A1C
Hgb A1c MFr Bld: 8.9 % — ABNORMAL HIGH (ref <5.7)
Mean Plasma Glucose: 209 mg/dL
eAG (mmol/L): 11.6 mmol/L

## 2024-06-02 LAB — LIPID PANEL
Cholesterol: 161 mg/dL (ref <200)
HDL: 44 mg/dL — ABNORMAL LOW (ref >=50)
LDL Cholesterol (Calc): 80 mg/dL
Non-HDL Cholesterol (Calc): 117 mg/dL (ref <130)
Total CHOL/HDL Ratio: 3.7 (calc) (ref <5.0)
Triglycerides: 285 mg/dL — ABNORMAL HIGH (ref <150)

## 2024-06-02 LAB — COMPREHENSIVE METABOLIC PANEL WITH GFR
AG Ratio: 1.7 (calc) (ref 1.0–2.5)
ALT: 97 U/L — ABNORMAL HIGH (ref 6–29)
AST: 60 U/L — ABNORMAL HIGH (ref 10–35)
Albumin: 4.5 g/dL (ref 3.6–5.1)
Alkaline phosphatase (APISO): 79 U/L (ref 31–125)
BUN: 17 mg/dL (ref 7–25)
CO2: 29 mmol/L (ref 20–32)
Calcium: 9.9 mg/dL (ref 8.6–10.2)
Chloride: 98 mmol/L (ref 98–110)
Creat: 0.82 mg/dL (ref 0.50–0.99)
Globulin: 2.7 g/dL (ref 1.9–3.7)
Glucose, Bld: 222 mg/dL — ABNORMAL HIGH (ref 65–99)
Potassium: 4 mmol/L (ref 3.5–5.3)
Sodium: 136 mmol/L (ref 135–146)
Total Bilirubin: 0.3 mg/dL (ref 0.2–1.2)
Total Protein: 7.2 g/dL (ref 6.1–8.1)
eGFR: 88 mL/min/1.73m2 (ref >=60)

## 2024-06-02 LAB — MICROALBUMIN / CREATININE URINE RATIO
Creatinine, Urine: 93 mg/dL (ref 20–275)
Microalb, Ur: <0.2 mg/dL

## 2024-06-03 ENCOUNTER — Ambulatory Visit: Payer: Self-pay | Admitting: Family Medicine

## 2024-06-03 ENCOUNTER — Other Ambulatory Visit: Payer: Self-pay | Admitting: Family Medicine

## 2024-06-03 DIAGNOSIS — E1169 Type 2 diabetes mellitus with other specified complication: Secondary | ICD-10-CM

## 2024-06-03 DIAGNOSIS — E1165 Type 2 diabetes mellitus with hyperglycemia: Secondary | ICD-10-CM

## 2024-06-04 MED ORDER — ROSUVASTATIN CALCIUM 20 MG PO TABS: 20.0000 mg | ORAL_TABLET | Freq: Every day | ORAL | 0 refills | Status: AC

## 2024-06-11 ENCOUNTER — Other Ambulatory Visit: Payer: Self-pay | Admitting: Family Medicine

## 2024-06-11 NOTE — Telephone Encounter (Signed)
 Last OV 05/12/23 Next OV not scheduled  Pt has not been seen in > 1 year, needs OV.

## 2024-06-18 MED ORDER — GABAPENTIN 300 MG PO CAPS: 600.0000 mg | ORAL_CAPSULE | Freq: Every day | ORAL | 0 refills | Status: DC

## 2024-06-18 NOTE — Telephone Encounter (Signed)
 Patient is scheduled for Monday.  Can this be refilled in the meantime?

## 2024-06-18 NOTE — Telephone Encounter (Signed)
 RX refilled

## 2024-06-18 NOTE — Addendum Note (Signed)
 Addended by: MARDY LEOTIS RAMAN on: 06/18/2024 01:41 PM   Modules accepted: Orders

## 2024-06-22 NOTE — Progress Notes (Signed)
"       ° °  Cheryl Ileana Collet, PhD, LAT, ATC acting as a scribe for Artist Lloyd, MD.  Cheryl [name] is a 49 y.o. female who presents to [facility] today for f/u lumbar radiculopathy. Pt was last seen by Dr. Lloyd on 05/12/23 and her gabapentin  was refilled.  Today, pt reports LBP is intermittent, but doesn't seem as bad. She has cont'd to take the gabapentin  at bedtime.   Dx imaging: 07/20/20 Pelvis & L-spine MRI             02/29/20 L-spine XR  Pertinent review of systems: No fevers or chills  Relevant historical information: Diabetes.  History of pars defects lumbar spine with lumbar radiculopathy.   Exam:  BP 112/76   Pulse 85   Ht 5' 6 (1.676 m)   Wt 207 lb (93.9 kg)   LMP 05/31/2024   SpO2 93%   BMI 33.41 kg/m  General: Well Developed, well nourished, and in no acute distress.   MSK: Normal gait    Lab and Radiology Results No results found for this or any previous visit (from the past 72 hours). No results found.     Assessment and Plan: 49 y.o. female with back pain and lumbar radiculopathy due to pars defect.  Symptoms are well-managed with occasional gabapentin  at bedtime.  Medications refilled.  Continue core strengthening exercises and check back as needed.   PDMP not reviewed this encounter. No orders of the defined types were placed in this encounter.  Meds ordered this encounter  Medications   gabapentin  (NEURONTIN ) 300 MG capsule    Sig: Take 1-2 capsules (300-600 mg total) by mouth at bedtime as needed.    Dispense:  180 capsule    Refill:  3     Discussed warning signs or symptoms. Please see discharge instructions. Patient expresses understanding.   The above documentation has been reviewed and is accurate and complete Artist Lloyd, M.D.   "

## 2024-06-25 ENCOUNTER — Ambulatory Visit (INDEPENDENT_AMBULATORY_CARE_PROVIDER_SITE_OTHER): Admitting: Family Medicine

## 2024-06-25 VITALS — BP 112/76 | HR 85 | Ht 66.0 in | Wt 207.0 lb

## 2024-06-25 DIAGNOSIS — M5416 Radiculopathy, lumbar region: Secondary | ICD-10-CM | POA: Diagnosis not present

## 2024-06-25 DIAGNOSIS — M4306 Spondylolysis, lumbar region: Secondary | ICD-10-CM

## 2024-06-25 MED ORDER — GABAPENTIN 300 MG PO CAPS: 300.0000 mg | ORAL_CAPSULE | Freq: Every evening | ORAL | 3 refills | Status: AC | PRN

## 2024-06-28 ENCOUNTER — Telehealth: Payer: Self-pay

## 2024-06-28 ENCOUNTER — Other Ambulatory Visit (HOSPITAL_COMMUNITY): Payer: Self-pay

## 2024-06-28 NOTE — Telephone Encounter (Signed)
Needs PA please.

## 2024-06-28 NOTE — Telephone Encounter (Signed)
 Clinical questions answered and PA submitted.

## 2024-06-28 NOTE — Telephone Encounter (Signed)
 Copied from CRM [id number]. Topic: Clinical - Medication Prior Auth >> Jun 28, 2024  2:09 PM Winona R wrote: Pt states she need a PA for tirzepatide  (MOUNJARO ) 2.5 MG/0.5ML Pen.

## 2024-06-29 NOTE — Telephone Encounter (Signed)
 Pharmacy Patient Advocate Encounter  Received notification from [employer] that Prior Authorization for Mounjaro  2.5mg /0.32ml has been APPROVED from 06/29/24 to 06/29/25   PA #/Case ID/Reference #: [id number]

## 2024-07-10 ENCOUNTER — Other Ambulatory Visit: Payer: Self-pay | Admitting: Medical Genetics

## 2024-07-10 DIAGNOSIS — Z006 Encounter for examination for normal comparison and control in clinical research program: Secondary | ICD-10-CM

## 2024-07-27 ENCOUNTER — Other Ambulatory Visit: Payer: Self-pay | Admitting: Family Medicine

## 2024-07-27 DIAGNOSIS — E1165 Type 2 diabetes mellitus with hyperglycemia: Secondary | ICD-10-CM

## 2024-08-03 ENCOUNTER — Other Ambulatory Visit (HOSPITAL_COMMUNITY)
